# Patient Record
Sex: Male | Born: 1935 | Race: Black or African American | Hispanic: No | Smoking: Former smoker
Health system: Southern US, Community
[De-identification: ages and names within clinical notes are randomized; demographics above are authoritative.]

## PROBLEM LIST (undated history)

## (undated) DIAGNOSIS — M1990 Unspecified osteoarthritis, unspecified site: Secondary | ICD-10-CM

## (undated) DIAGNOSIS — R251 Tremor, unspecified: Secondary | ICD-10-CM

## (undated) DIAGNOSIS — G629 Polyneuropathy, unspecified: Secondary | ICD-10-CM

## (undated) DIAGNOSIS — Z950 Presence of cardiac pacemaker: Secondary | ICD-10-CM

## (undated) DIAGNOSIS — I2510 Atherosclerotic heart disease of native coronary artery without angina pectoris: Secondary | ICD-10-CM

## (undated) DIAGNOSIS — I10 Essential (primary) hypertension: Secondary | ICD-10-CM

## (undated) DIAGNOSIS — R200 Anesthesia of skin: Secondary | ICD-10-CM

## (undated) DIAGNOSIS — I480 Paroxysmal atrial fibrillation: Secondary | ICD-10-CM

## (undated) HISTORY — PX: OTHER SURGICAL HISTORY: SHX169

## (undated) HISTORY — DX: Anesthesia of skin: R20.0

## (undated) HISTORY — DX: Tremor, unspecified: R25.1

## (undated) HISTORY — DX: Polyneuropathy, unspecified: G62.9

## (undated) HISTORY — PX: CARDIAC CATHETERIZATION: SHX172

---

## 2000-01-14 ENCOUNTER — Ambulatory Visit (HOSPITAL_BASED_OUTPATIENT_CLINIC_OR_DEPARTMENT_OTHER): Admission: RE | Admit: 2000-01-14 | Discharge: 2000-01-14 | Payer: Self-pay | Admitting: Ophthalmology

## 2000-11-30 ENCOUNTER — Ambulatory Visit (HOSPITAL_COMMUNITY): Admission: RE | Admit: 2000-11-30 | Discharge: 2000-11-30 | Payer: Self-pay | Admitting: *Deleted

## 2000-12-11 ENCOUNTER — Encounter: Payer: Self-pay | Admitting: Cardiology

## 2000-12-11 ENCOUNTER — Ambulatory Visit (HOSPITAL_COMMUNITY): Admission: RE | Admit: 2000-12-11 | Discharge: 2000-12-11 | Payer: Self-pay | Admitting: Cardiology

## 2001-11-23 ENCOUNTER — Encounter: Payer: Self-pay | Admitting: Ophthalmology

## 2001-11-23 ENCOUNTER — Ambulatory Visit (HOSPITAL_COMMUNITY): Admission: RE | Admit: 2001-11-23 | Discharge: 2001-11-23 | Payer: Self-pay | Admitting: Ophthalmology

## 2008-05-30 ENCOUNTER — Emergency Department (HOSPITAL_COMMUNITY): Admission: EM | Admit: 2008-05-30 | Discharge: 2008-05-30 | Payer: Self-pay | Admitting: Family Medicine

## 2010-01-05 ENCOUNTER — Encounter: Admission: RE | Admit: 2010-01-05 | Discharge: 2010-01-05 | Payer: Self-pay | Admitting: Cardiology

## 2010-01-08 ENCOUNTER — Ambulatory Visit (HOSPITAL_COMMUNITY): Admission: RE | Admit: 2010-01-08 | Discharge: 2010-01-08 | Payer: Self-pay | Admitting: Cardiology

## 2010-02-26 ENCOUNTER — Ambulatory Visit (HOSPITAL_COMMUNITY): Admission: RE | Admit: 2010-02-26 | Discharge: 2010-02-27 | Payer: Self-pay | Admitting: Cardiology

## 2010-04-09 ENCOUNTER — Encounter: Admission: RE | Admit: 2010-04-09 | Discharge: 2010-04-09 | Payer: Self-pay | Admitting: Cardiology

## 2010-04-19 ENCOUNTER — Emergency Department (HOSPITAL_COMMUNITY): Admission: EM | Admit: 2010-04-19 | Discharge: 2010-04-19 | Payer: Self-pay | Admitting: Family Medicine

## 2010-05-20 ENCOUNTER — Encounter (INDEPENDENT_AMBULATORY_CARE_PROVIDER_SITE_OTHER): Payer: Self-pay | Admitting: Emergency Medicine

## 2010-05-20 ENCOUNTER — Ambulatory Visit
Admission: RE | Admit: 2010-05-20 | Discharge: 2010-05-20 | Payer: Self-pay | Source: Home / Self Care | Admitting: Emergency Medicine

## 2010-05-20 ENCOUNTER — Emergency Department (HOSPITAL_COMMUNITY): Admission: EM | Admit: 2010-05-20 | Discharge: 2010-05-20 | Payer: Self-pay | Admitting: Emergency Medicine

## 2010-05-20 ENCOUNTER — Ambulatory Visit: Payer: Self-pay | Admitting: Vascular Surgery

## 2010-05-24 ENCOUNTER — Emergency Department (HOSPITAL_COMMUNITY): Admission: EM | Admit: 2010-05-24 | Discharge: 2010-05-24 | Payer: Self-pay | Admitting: Family Medicine

## 2011-03-18 NOTE — Cardiovascular Report (Signed)
. [facility]  Patient:    David Booth, David Booth                           MRN: [id number] Proc. Date: 12/11/00 Adm. Date:  [id number] Disc. Date: [id number] Attending:  Sabino Gasser CC:         Jonita Albee, M.D., Urgent Care, [place], [place]  Cardiac Catheterization Lab   Cardiac Catheterization  PROCEDURES PERFORMED: 1. Selective coronary angiography by Judkins technique. 2. Retrograde left heart catheterization. 3. Left ventricular angiography. 4. Abdominal aortography.  COMPLICATIONS:  None.  ENTRY SITE:  Right femoral artery.  DYE USED:  Omnipaque.  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 He has a family history of coronary disease.  He does not have chest pain. Todays outpatient cardiac catheterization was performed electively.  RESULTS:  Pressures:  The left ventricular pressure is 140/18.  Central aortic pressure 140/70, mean of 100.  No aortic valve gradient by pullback technique.  Angiographic results: 1. The left main coronary artery is fairly short.  No lesions are seen. 2. The left anterior descending coronary artery courses to the cardiac apex    and gives rise to a pair of diagonal branches that are normal, as is the    LAD. 3. The circumflex and 1 obtuse marginal branch are normal. 4. The right coronary artery is basically normal.  Left ventriculogram:  The left ventricle shows normal contractility. Calculated ejection fraction is 59%.  No mitral regurgitation or LV thrombus is seen.  Abdominal aortography shows a smooth normal-sized aorta and common iliacs. The renal arteries are bilaterally normal.  FINAL DIAGNOSES: 1. Angiographically patent coronary arteries. 2. False positive EKG test for ischemia (clinical diagnosis). 3. Normal left ventricular systolic function, 59%. 4. Normal renal arteries and abdominal  aorta. DD:  12/11/00 TD:  12/11/00 Job: [id number]

## 2011-03-18 NOTE — Procedures (Signed)
[facility]  Patient:    David Booth, David Booth                           MRN: [id number] Proc. Date: 11/30/00 Adm. Date:  [id number] Attending:  Sabino Gasser                           Procedure Report  PROCEDURE:  Colonoscopy.  INDICATIONS:  Hemoccult positivity.  ANESTHESIA:  Demerol 60 mg, Versed 7 mg.  DESCRIPTION OF PROCEDURE:  With the patient mildly sedated in the left lateral decubitus position, the Olympus videoscopic colonoscope was inserted into the rectum after normal rectal exam and passed under direct vision to the cecum. The cecum identified by the ileocecal valve and appendiceal orifice, both of which were photographed.  From this point, the colonoscope was slowly withdrawn taking circumferential views of the entire colonic mucosa, stopping in numerous places to suction and dilute stool that was seen.  This had particulate matter in it that clogged the colonoscope on a number of occasions, and we had to brush the scope and dilute out this material because of continued blockage of the suction orifice.  Subsequently we suctioned as much as we could, taking circumferential views of the colonic mucosa as best we could until we reached the rectum which appeared normal on direct and retroflexed view.  The endoscope was straightened and withdrawn.  The patients vital signs and pulse oximeter remained stable.  The patient tolerated the procedure well without apparent complications.  FINDINGS:  Grossly negative examination.  Limited somewhat by prep.  PLAN:  May want to repeat this examination in two years for follow-up with maybe a better prep. DD:  11/30/00 TD:  11/30/00 Job: [id number]

## 2011-09-30 ENCOUNTER — Emergency Department (HOSPITAL_COMMUNITY)
Admission: EM | Admit: 2011-09-30 | Discharge: 2011-09-30 | Disposition: A | Discharge status: Home / Self Care | Payer: Medicare Other | Source: Home / Self Care

## 2011-09-30 DIAGNOSIS — Z91030 Bee allergy status: Secondary | ICD-10-CM

## 2011-09-30 DIAGNOSIS — T63441A Toxic effect of venom of bees, accidental (unintentional), initial encounter: Secondary | ICD-10-CM

## 2011-09-30 HISTORY — DX: Atherosclerotic heart disease of native coronary artery without angina pectoris: I25.10

## 2011-09-30 HISTORY — DX: Essential (primary) hypertension: I10

## 2011-09-30 HISTORY — DX: Presence of cardiac pacemaker: Z95.0

## 2011-09-30 MED ORDER — PREDNISONE 20 MG PO TABS
ORAL_TABLET | ORAL | Status: AC
  Filled 2011-09-30: qty 3

## 2011-09-30 MED ORDER — PREDNISONE 10 MG PO TABS: ORAL_TABLET | ORAL | Status: AC

## 2011-09-30 MED ORDER — PREDNISONE 20 MG PO TABS
60.0000 mg | ORAL_TABLET | Freq: Once | ORAL | Status: AC
  Administered 2011-09-30: 60 mg via ORAL

## 2011-09-30 NOTE — ED Provider Notes (Signed)
History     CSN: [id number] Arrival date & time: 09/30/2011  1:57 PM   None     Chief Complaint  Patient presents with  . Insect Bite    (Consider location/radiation/quality/duration/timing/severity/associated sxs/prior treatment) HPI Comments: Pt states he was cleaning up leaves in the yard and felt something poke the finger of his Rt hand. He initially thought it was a stick in the leaf pile but later noticed redness and swelling. He has a hx of bee sting allergy. His allergy is localized severe swelling. Has an epi-pen but did not use it. No dyspnea, tongue or throat swelling.  The history is provided by the patient.    Past Medical History  Diagnosis Date  . Coronary artery disease   . Hypertension   . Pacemaker     History reviewed. No pertinent past surgical history.  History reviewed. No pertinent family history.  History  Substance Use Topics  . Smoking status: Never Smoker   . Smokeless tobacco: Not on file  . Alcohol Use: No      Review of Systems  Constitutional: Negative for fever and chills.  HENT: Negative for congestion, rhinorrhea, sneezing and trouble swallowing.   Respiratory: Negative for cough, choking, chest tightness, shortness of breath and wheezing.   Cardiovascular: Negative for chest pain and palpitations.    Allergies  Bee venom  Home Medications   Current Outpatient Rx  Name Route Sig Dispense Refill  . ASPIRIN 81 MG PO TABS Oral Take 81 mg by mouth daily.      Marland Kitchen DILTIAZEM HCL 120 MG PO TABS Oral Take 120 mg by mouth 4 (four) times daily.      Marland Kitchen METOPROLOL TARTRATE 25 MG PO TABS Oral Take 25 mg by mouth 2 (two) times daily.      Marland Kitchen PREDNISONE 10 MG PO TABS  Take 5 tabs day 1, then 4 tabs day 2, then 3 tabs day 3, then 2 tabs day 4, then 1 tab day 5 15 tablet 0    BP 149/87  Pulse 67  Temp(Src) 97.9 F (36.6 C) (Oral)  Resp 18  SpO2 97%  Physical Exam  Nursing note and vitals reviewed. Constitutional: He appears  well-developed and well-nourished. No distress.  HENT:  Head: Normocephalic and atraumatic.  Right Ear: Tympanic membrane, external ear and ear canal normal.  Left Ear: Tympanic membrane, external ear and ear canal normal.  Nose: Nose normal.  Mouth/Throat: Uvula is midline, oropharynx is clear and moist and mucous membranes are normal. No oropharyngeal exudate, posterior oropharyngeal edema or posterior oropharyngeal erythema.  Neck: Neck supple.  Cardiovascular: Normal rate, regular rhythm and normal heart sounds.   Pulmonary/Chest: Effort normal and breath sounds normal. No respiratory distress.  Musculoskeletal:       Right hand: He exhibits normal range of motion, no tenderness, no bony tenderness and normal capillary refill. Normal strength noted.       Hands: Lymphadenopathy:    He has no cervical adenopathy.  Neurological: He is alert.  Skin: Skin is warm and dry.  Psychiatric: He has a normal mood and affect.    ED Course  Procedures (including critical care time)  Labs Reviewed - No data to display No results found.   1. Bee sting   2. Hx of bee sting allergy       MDM          Melody Comas, PA 09/30/11 [phone]

## 2011-09-30 NOTE — ED Notes (Signed)
States he was in yard working to pick up leaves aprox 1 H prior to arrival, when he felt sharp stinging sensation in his hand ; initially thought it was a stick, since he was not wearing gloves, but later began to sting and swell; C/o allergic to bee stings, and was not carrying his epi-pen at the time, but did not think it was a problem at the time ; NAD at present

## 2011-10-06 NOTE — ED Provider Notes (Signed)
Medical screening examination/treatment/procedure(s) were performed by non-physician practitioner and as supervising physician I was immediately available for consultation/collaboration.  LYKINS,KIMBERLY G  D.O.    Kimberly G Lykins, MD 10/06/11 0817 

## 2011-11-10 DIAGNOSIS — I10 Essential (primary) hypertension: Secondary | ICD-10-CM | POA: Diagnosis not present

## 2011-11-10 DIAGNOSIS — E781 Pure hyperglyceridemia: Secondary | ICD-10-CM | POA: Diagnosis not present

## 2011-11-14 DIAGNOSIS — I495 Sick sinus syndrome: Secondary | ICD-10-CM | POA: Diagnosis not present

## 2011-11-14 DIAGNOSIS — I4891 Unspecified atrial fibrillation: Secondary | ICD-10-CM | POA: Diagnosis not present

## 2011-11-14 DIAGNOSIS — I498 Other specified cardiac arrhythmias: Secondary | ICD-10-CM | POA: Diagnosis not present

## 2012-02-12 DIAGNOSIS — I498 Other specified cardiac arrhythmias: Secondary | ICD-10-CM | POA: Diagnosis not present

## 2012-02-12 DIAGNOSIS — I4891 Unspecified atrial fibrillation: Secondary | ICD-10-CM | POA: Diagnosis not present

## 2012-02-12 DIAGNOSIS — I495 Sick sinus syndrome: Secondary | ICD-10-CM | POA: Diagnosis not present

## 2012-03-22 DIAGNOSIS — I495 Sick sinus syndrome: Secondary | ICD-10-CM | POA: Diagnosis not present

## 2012-03-22 DIAGNOSIS — Z45018 Encounter for adjustment and management of other part of cardiac pacemaker: Secondary | ICD-10-CM | POA: Diagnosis not present

## 2012-03-22 DIAGNOSIS — I472 Ventricular tachycardia: Secondary | ICD-10-CM | POA: Diagnosis not present

## 2012-05-14 DIAGNOSIS — M1250 Traumatic arthropathy, unspecified site: Secondary | ICD-10-CM | POA: Diagnosis not present

## 2012-05-14 DIAGNOSIS — J309 Allergic rhinitis, unspecified: Secondary | ICD-10-CM | POA: Diagnosis not present

## 2012-05-14 DIAGNOSIS — I10 Essential (primary) hypertension: Secondary | ICD-10-CM | POA: Diagnosis not present

## 2012-05-14 DIAGNOSIS — I499 Cardiac arrhythmia, unspecified: Secondary | ICD-10-CM | POA: Diagnosis not present

## 2012-06-13 DIAGNOSIS — I472 Ventricular tachycardia: Secondary | ICD-10-CM | POA: Diagnosis not present

## 2012-06-13 DIAGNOSIS — I495 Sick sinus syndrome: Secondary | ICD-10-CM | POA: Diagnosis not present

## 2012-06-13 DIAGNOSIS — I4891 Unspecified atrial fibrillation: Secondary | ICD-10-CM | POA: Diagnosis not present

## 2012-06-13 DIAGNOSIS — H02059 Trichiasis without entropian unspecified eye, unspecified eyelid: Secondary | ICD-10-CM | POA: Diagnosis not present

## 2012-07-19 DIAGNOSIS — Z23 Encounter for immunization: Secondary | ICD-10-CM | POA: Diagnosis not present

## 2012-09-08 DIAGNOSIS — I495 Sick sinus syndrome: Secondary | ICD-10-CM | POA: Diagnosis not present

## 2012-10-15 DIAGNOSIS — H02059 Trichiasis without entropian unspecified eye, unspecified eyelid: Secondary | ICD-10-CM | POA: Diagnosis not present

## 2013-02-04 DIAGNOSIS — I4891 Unspecified atrial fibrillation: Secondary | ICD-10-CM | POA: Diagnosis not present

## 2013-02-04 DIAGNOSIS — I495 Sick sinus syndrome: Secondary | ICD-10-CM | POA: Diagnosis not present

## 2013-02-04 LAB — PACEMAKER DEVICE OBSERVATION

## 2013-02-08 DIAGNOSIS — Z125 Encounter for screening for malignant neoplasm of prostate: Secondary | ICD-10-CM | POA: Diagnosis not present

## 2013-02-08 DIAGNOSIS — I10 Essential (primary) hypertension: Secondary | ICD-10-CM | POA: Diagnosis not present

## 2013-03-26 ENCOUNTER — Encounter: Payer: Self-pay | Admitting: *Deleted

## 2013-04-03 ENCOUNTER — Other Ambulatory Visit: Payer: Self-pay | Admitting: Cardiovascular Disease

## 2013-04-03 ENCOUNTER — Encounter: Payer: Self-pay | Admitting: Cardiovascular Disease

## 2013-04-03 ENCOUNTER — Ambulatory Visit (INDEPENDENT_AMBULATORY_CARE_PROVIDER_SITE_OTHER): Payer: Medicare Other | Admitting: Cardiovascular Disease

## 2013-04-03 VITALS — BP 140/80 | HR 65 | Resp 16 | Ht 69.0 in | Wt 173.4 lb

## 2013-04-03 DIAGNOSIS — T82110D Breakdown (mechanical) of cardiac electrode, subsequent encounter: Secondary | ICD-10-CM

## 2013-04-03 DIAGNOSIS — N529 Male erectile dysfunction, unspecified: Secondary | ICD-10-CM | POA: Diagnosis not present

## 2013-04-03 DIAGNOSIS — I495 Sick sinus syndrome: Secondary | ICD-10-CM

## 2013-04-03 DIAGNOSIS — Z950 Presence of cardiac pacemaker: Secondary | ICD-10-CM | POA: Diagnosis not present

## 2013-04-03 DIAGNOSIS — I4510 Unspecified right bundle-branch block: Secondary | ICD-10-CM | POA: Diagnosis not present

## 2013-04-03 DIAGNOSIS — Z5189 Encounter for other specified aftercare: Secondary | ICD-10-CM | POA: Diagnosis not present

## 2013-04-03 NOTE — Progress Notes (Signed)
In office device interrogation. Normal device function. No changes made this session.

## 2013-04-03 NOTE — Patient Instructions (Addendum)
Your physician recommends that you schedule a follow-up appointment in: 1 year. Next remote pacemaker check is due on 07/08/2013.

## 2013-04-04 LAB — PACEMAKER DEVICE OBSERVATION
AL IMPEDENCE PM: 496 Ohm
ATRIAL PACING PM: 98.7
BAMS-0001: 171 {beats}/min
RV LEAD AMPLITUDE: 3 mv
RV LEAD IMPEDENCE PM: 340 Ohm

## 2013-04-07 ENCOUNTER — Encounter: Payer: Self-pay | Admitting: Cardiovascular Disease

## 2013-04-07 DIAGNOSIS — T82110A Breakdown (mechanical) of cardiac electrode, initial encounter: Secondary | ICD-10-CM | POA: Insufficient documentation

## 2013-04-07 DIAGNOSIS — I495 Sick sinus syndrome: Secondary | ICD-10-CM | POA: Insufficient documentation

## 2013-04-07 DIAGNOSIS — Z950 Presence of cardiac pacemaker: Secondary | ICD-10-CM | POA: Insufficient documentation

## 2013-04-07 DIAGNOSIS — N529 Male erectile dysfunction, unspecified: Secondary | ICD-10-CM | POA: Insufficient documentation

## 2013-04-07 NOTE — Assessment & Plan Note (Signed)
Since his last pacemaker check on the one episode of rapid ventricular rate has been recorded, a 14 second episode of paroxysmal atrial tachycardia

## 2013-04-07 NOTE — Progress Notes (Signed)
Patient ID: David Booth, male   DOB: 1933/10/19, 79 y.o.   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 He has not been troubled by palpitations syncope dizziness light headedness focal cortical deficits or any discomfort at the device site. The device was implanted for sinus node dysfunction with symptomatic bradycardia and need for medications to treat atrial tachycardia. He does not have a history of symptomatic atrial ventricular block but does have right bundle branch block by electrocardiography. He has a long AV conduction time.   Unfortunately his ventricular lead, which had fairly good parameters at implantation, has shown a pattern of gradual the duration and both sensing and pacing thresholds. To date this has not caused any problems of device function    Allergies  Allergen Reactions  . Bee Venom     Current Outpatient Prescriptions  Medication Sig Dispense Refill  . aspirin 81 MG tablet Take 81 mg by mouth daily.        Marland Kitchen b complex vitamins tablet Take 1 tablet by mouth daily.      . Cholecalciferol (VITAMIN D3) 2000 UNITS TABS Take by mouth 2 (two) times daily.      . Coenzyme Q10 (CO Q 10) 100 MG CAPS Take by mouth.      . diltiazem (CARDIZEM) 120 MG tablet Take 120 mg by mouth 4 (four) times daily.        Marland Kitchen EPINEPHrine (EPI-PEN) 0.3 mg/0.3 mL DEVI Inject into the muscle as needed.      . Garlic 1000 MG CAPS Take by mouth.      . Glucosamine-Chondroitin 750-600 MG TABS Take by mouth 3 (three) times daily.      . Lycopene 10 MG CAPS Take by mouth daily.      . metoprolol tartrate (LOPRESSOR) 25 MG tablet Take 25 mg by mouth 2 (two) times daily.        . Multiple Vitamin (MULTIVITAMIN) tablet Take 1 tablet by mouth daily.      . Omega-3 Fatty Acids (FISH OIL) 1200 MG  CAPS Take by mouth daily.      . vitamin C (ASCORBIC ACID) 250 MG tablet Take 250 mg by mouth daily.      . vitamin E (VITAMIN E) 400 UNIT capsule Take 400 Units by mouth daily.       No current facility-administered medications for this visit.    Past Medical History  Diagnosis Date  . Coronary artery disease   . Hypertension   . Pacemaker     No past surgical history on file.  No family history on file.  History   Social History  . Marital Status: Divorced    Spouse Name: N/A    Number of Children: N/A  . Years of Education: N/A   Occupational History  . Not on file.   Social History Main Topics  . Smoking status: Never Smoker   . Smokeless tobacco: Not on file  . Alcohol Use: No  . Drug Use: No  . Sexually Active:    Other Topics Concern  . Not on file   Social History Narrative  . No narrative on file    Review of systems: The patient specifically denies any chest pain at rest or with exertion, dyspnea at rest or with exertion, orthopnea,  paroxysmal nocturnal dyspnea, syncope, palpitations, focal neurological deficits, intermittent claudication, lower extremity edema, unexplained weight gain, cough, hemoptysis or wheezing. He has erectile dysfunction which is satisfactory treated with Viagra  The patient also denies abdominal pain, nausea, vomiting, dysphagia, diarrhea, constipation, polyuria, polydipsia, dysuria, hematuria, frequency, urgency, abnormal bleeding or bruising, fever, chills, unexpected weight changes, mood swings, change in skin or hair texture, change in voice quality, auditory or visual problems, allergic reactions or rashes, new musculoskeletal complaints other than usual "aches and pains".   PHYSICAL EXAM BP 140/80  Pulse 65  Resp 16  Ht 5\' 9"  (1.753 m)  Wt 78.654 kg (173 lb 6.4 oz)  BMI 25.6 kg/m2  General: Alert, oriented x3, no distress Head: no evidence of trauma, PERRL, EOMI, no exophtalmos or lid lag, no myxedema, no xanthelasma;  normal ears, nose and oropharynx Neck: normal jugular venous pulsations and no hepatojugular reflux; brisk carotid pulses without delay and no carotid bruits Chest: clear to auscultation, no signs of consolidation by percussion or palpation, normal fremitus, symmetrical and full respiratory excursions; LV subclavian pacemaker site Cardiovascular: normal position and quality of the apical impulse, regular rhythm, normal first and second heart sounds, no murmurs, rubs or gallops Abdomen: no tenderness or distention, no masses by palpation, no abnormal pulsatility or arterial bruits, normal bowel sounds, no hepatosplenomegaly Extremities: no clubbing, cyanosis or edema; 2+ radial, ulnar and brachial pulses bilaterally; 2+ right femoral, posterior tibial and dorsalis pedis pulses; 2+ left femoral, posterior tibial and dorsalis pedis pulses; no subclavian or femoral bruits Neurological: grossly nonfocal   EKG: Atrial paced ventricular sensed rhythm, long AV delay in excess of 440 ms, pre-existing right bundle branch block  Lipid Panel  No results found for this basename: chol, trig, hdl, cholhdl, vldl, ldlcalc    BMET No results found for this basename: na, k, cl, co2, glucose, bun, creatinine, calcium, gfrnonaa, gfraa     ASSESSMENT AND PLAN Tachy-brady syndrome Since his last pacemaker check on the one episode of rapid ventricular rate has been recorded, a 14 second episode of paroxysmal atrial tachycardia  Pacemaker Medtronic Revo, dual chamber, MRI-conditional, implanted April 2011. There is almost 99% atrial pacing but only 0.2% ventricular pacing. Atrial lead parameters are all excellent. The battery voltage is 2.99) ER I have to 0.1 V). The sensed ventricular waves are only 3.0 mV and the ventricular pacing threshold was elevated at 1.5 V at 0.8 ms. The ventricular lead parameters were much better at the time of device implantation and then showed gradual worsening over time.   No  orders of the defined types were placed in this encounter.   Meds ordered this encounter  Medications  . Glucosamine-Chondroitin 750-600 MG TABS    Sig: Take by mouth 3 (three) times daily.  . Omega-3 Fatty Acids (FISH OIL) 1200 MG CAPS    Sig: Take by mouth daily.  . Cholecalciferol (VITAMIN D3) 2000 UNITS TABS    Sig: Take by mouth 2 (two) times daily.  . Lycopene 10 MG CAPS    Sig: Take by mouth daily.  Marland Kitchen b complex vitamins tablet    Sig: Take 1 tablet by mouth daily.  . Multiple Vitamin (MULTIVITAMIN) tablet    Sig: Take 1 tablet by mouth daily.  . vitamin C (ASCORBIC ACID) 250 MG tablet    Sig: Take 250 mg by mouth daily.  . Coenzyme Q10 (CO Q 10) 100 MG CAPS    Sig: Take by mouth.  . vitamin E (VITAMIN E)  400 UNIT capsule    Sig: Take 400 Units by mouth daily.  . Garlic 1000 MG CAPS    Sig: Take by mouth.  . EPINEPHrine (EPI-PEN) 0.3 mg/0.3 mL DEVI    Sig: Inject into the muscle as needed.    Junious Silk, MD, [facility] [phone] office [phone] pager

## 2013-04-07 NOTE — Assessment & Plan Note (Signed)
Medtronic Revo, dual chamber, MRI-conditional, implanted April 2011. There is almost 99% atrial pacing but only 0.2% ventricular pacing. Atrial lead parameters are all excellent. The battery voltage is 2.99) ER I have to 0.1 V). The sensed ventricular waves are only 3.0 mV and the ventricular pacing threshold was elevated at 1.5 V at 0.8 ms. The ventricular lead parameters were much better at the time of device implantation and then showed gradual worsening over time.

## 2013-04-08 DIAGNOSIS — I4510 Unspecified right bundle-branch block: Secondary | ICD-10-CM | POA: Insufficient documentation

## 2013-04-09 ENCOUNTER — Encounter: Payer: Self-pay | Admitting: Cardiovascular Disease

## 2013-04-10 ENCOUNTER — Other Ambulatory Visit: Payer: Self-pay | Admitting: Cardiovascular Disease

## 2013-04-16 DIAGNOSIS — H02059 Trichiasis without entropian unspecified eye, unspecified eyelid: Secondary | ICD-10-CM | POA: Diagnosis not present

## 2013-04-16 DIAGNOSIS — H2510 Age-related nuclear cataract, unspecified eye: Secondary | ICD-10-CM | POA: Diagnosis not present

## 2013-06-25 DIAGNOSIS — I10 Essential (primary) hypertension: Secondary | ICD-10-CM | POA: Diagnosis not present

## 2013-07-11 ENCOUNTER — Other Ambulatory Visit: Payer: Self-pay | Admitting: Cardiovascular Disease

## 2013-07-11 DIAGNOSIS — I4891 Unspecified atrial fibrillation: Secondary | ICD-10-CM | POA: Diagnosis not present

## 2013-07-11 DIAGNOSIS — I495 Sick sinus syndrome: Secondary | ICD-10-CM

## 2013-07-11 LAB — PACEMAKER DEVICE OBSERVATION

## 2013-07-18 ENCOUNTER — Encounter: Payer: Self-pay | Admitting: *Deleted

## 2013-07-18 LAB — REMOTE PACEMAKER DEVICE
AL AMPLITUDE: 2.4 mv
AL IMPEDENCE PM: 496 Ohm
ATRIAL PACING PM: 98.6
BAMS-0001: 171 {beats}/min
BATTERY VOLTAGE: 2.99 V
RV LEAD IMPEDENCE PM: 328 Ohm
VENTRICULAR PACING PM: 0.1

## 2013-08-01 DIAGNOSIS — Z23 Encounter for immunization: Secondary | ICD-10-CM | POA: Diagnosis not present

## 2013-08-27 ENCOUNTER — Telehealth: Payer: Self-pay | Admitting: Cardiovascular Disease

## 2013-08-27 ENCOUNTER — Other Ambulatory Visit: Payer: Self-pay | Admitting: *Deleted

## 2013-08-27 MED ORDER — DILTIAZEM HCL ER COATED BEADS 120 MG PO CP24: 120.0000 mg | ORAL_CAPSULE | Freq: Every day | ORAL | Status: DC

## 2013-08-27 NOTE — Telephone Encounter (Signed)
Returned call.  Left message asking that hardy copy request be faxed to [phone] for refills.  

## 2013-08-27 NOTE — Telephone Encounter (Signed)
Rx was sent to pharmacy electronically. 

## 2013-08-27 NOTE — Telephone Encounter (Signed)
Need refill on his Diltiazem 120 mg #90

## 2013-10-10 ENCOUNTER — Other Ambulatory Visit: Payer: Self-pay | Admitting: Cardiovascular Disease

## 2013-10-10 NOTE — Telephone Encounter (Signed)
Rx was sent to pharmacy electronically. 

## 2013-10-14 ENCOUNTER — Ambulatory Visit: Payer: Medicare Other

## 2013-10-14 DIAGNOSIS — I495 Sick sinus syndrome: Secondary | ICD-10-CM | POA: Diagnosis not present

## 2013-10-16 DIAGNOSIS — H02059 Trichiasis without entropian unspecified eye, unspecified eyelid: Secondary | ICD-10-CM | POA: Diagnosis not present

## 2013-10-16 DIAGNOSIS — H2510 Age-related nuclear cataract, unspecified eye: Secondary | ICD-10-CM | POA: Diagnosis not present

## 2013-10-27 ENCOUNTER — Encounter: Payer: Self-pay | Admitting: *Deleted

## 2013-10-27 LAB — MDC_IDC_ENUM_SESS_TYPE_REMOTE
Brady Statistic AP VS Percent: 98.97 %
Brady Statistic AS VP Percent: 0 %
Brady Statistic RA Percent Paced: 99.1 %
Date Time Interrogation Session: 20141215231433
Lead Channel Impedance Value: 528 Ohm
Lead Channel Setting Pacing Amplitude: 2 V
Zone Setting Detection Interval: 350 ms

## 2013-11-22 ENCOUNTER — Other Ambulatory Visit: Payer: Self-pay | Admitting: Cardiovascular Disease

## 2013-11-22 NOTE — Telephone Encounter (Signed)
Rx was sent to pharmacy electronically. 

## 2013-11-25 DIAGNOSIS — I10 Essential (primary) hypertension: Secondary | ICD-10-CM | POA: Diagnosis not present

## 2013-11-25 DIAGNOSIS — Z125 Encounter for screening for malignant neoplasm of prostate: Secondary | ICD-10-CM | POA: Diagnosis not present

## 2014-01-14 ENCOUNTER — Ambulatory Visit (INDEPENDENT_AMBULATORY_CARE_PROVIDER_SITE_OTHER): Payer: Medicare Other | Admitting: *Deleted

## 2014-01-14 DIAGNOSIS — I495 Sick sinus syndrome: Secondary | ICD-10-CM

## 2014-01-14 LAB — MDC_IDC_ENUM_SESS_TYPE_REMOTE
Battery Voltage: 2.99 V
Brady Statistic AP VP Percent: 0.14 %
Brady Statistic AS VS Percent: 1.06 %
Brady Statistic RA Percent Paced: 98.94 %
Date Time Interrogation Session: 20150317162530
Lead Channel Impedance Value: 536 Ohm
Lead Channel Sensing Intrinsic Amplitude: 2.0782
Lead Channel Setting Pacing Amplitude: 2 V
Lead Channel Setting Pacing Amplitude: 3 V
Lead Channel Setting Pacing Pulse Width: 0.8 ms
MDC IDC MSMT LEADCHNL RV IMPEDANCE VALUE: 324 Ohm
MDC IDC MSMT LEADCHNL RV SENSING INTR AMPL: 3.703
MDC IDC SET LEADCHNL RV SENSING SENSITIVITY: 0.9 mV
MDC IDC STAT BRADY AP VS PERCENT: 98.8 %
MDC IDC STAT BRADY AS VP PERCENT: 0 %
MDC IDC STAT BRADY RV PERCENT PACED: 0.14 %
Zone Setting Detection Interval: 350 ms
Zone Setting Detection Interval: 400 ms

## 2014-01-14 LAB — PACEMAKER DEVICE OBSERVATION

## 2014-01-27 NOTE — Progress Notes (Signed)
PPM remote 

## 2014-01-31 ENCOUNTER — Encounter: Payer: Self-pay | Admitting: *Deleted

## 2014-03-31 DIAGNOSIS — E7800 Pure hypercholesterolemia, unspecified: Secondary | ICD-10-CM | POA: Diagnosis not present

## 2014-03-31 DIAGNOSIS — I10 Essential (primary) hypertension: Secondary | ICD-10-CM | POA: Diagnosis not present

## 2014-04-03 ENCOUNTER — Ambulatory Visit (INDEPENDENT_AMBULATORY_CARE_PROVIDER_SITE_OTHER): Payer: Medicare Other | Admitting: Cardiovascular Disease

## 2014-04-03 ENCOUNTER — Encounter: Payer: Self-pay | Admitting: Cardiovascular Disease

## 2014-04-03 VITALS — BP 117/67 | HR 67 | Resp 16 | Ht 69.5 in | Wt 176.0 lb

## 2014-04-03 DIAGNOSIS — Z950 Presence of cardiac pacemaker: Secondary | ICD-10-CM | POA: Diagnosis not present

## 2014-04-03 DIAGNOSIS — I495 Sick sinus syndrome: Secondary | ICD-10-CM

## 2014-04-03 LAB — PACEMAKER DEVICE OBSERVATION

## 2014-04-03 NOTE — Progress Notes (Signed)
Patient ID: David Booth, male   DOB: [birth year], 80 y.o.   MRN: [id number]     Reason for office visit Tachybradycardia syndrome, pacemaker followup  This is a year with a followup for David Booth and he has not had any new health problems in the last 12 months. Unfortunately his fiance has been diagnosed with advanced cancer and appears to be approaching her last days.  He has a dual chamber permanent pacemaker implant in April 2011 for sinus bradycardia, AV conduction disease and need for medications for paroxysmal atrial tachycardia. His device is an MRI conditional Medtronic revo dual chamber device. Despite a very long AV conduction time. He continues to have virtually 0 need for ventricular pacing (PR interval 394 ms) the device programmed in the PE. He has a long-standing right bundle branch block. There is virtually 100% atrial pacing. Underlying rhythm is sinus bradycardia at 36 beats per minute. Lead parameters are adequate, with a chronically low sensed R-wave of only 3 mV. He also has a relatively high ventricular pacing threshold at 1.0 V 0.8 ms. The deterioration in these parameters (which were excellent at implant) seems to have stabilized, and so far they have not interfered with normal device function.  He has no cardiovascular complaints.  Allergies  Allergen Reactions  . Bee Venom     Current Outpatient Prescriptions  Medication Sig Dispense Refill  . aspirin 81 MG tablet Take 81 mg by mouth daily.        Marland Kitchen b complex vitamins tablet Take 1 tablet by mouth daily.      . Cholecalciferol (VITAMIN D3) 2000 UNITS TABS Take 1 tablet by mouth daily.       . Coenzyme Q10 (CO Q 10) 100 MG CAPS Take 1 capsule by mouth daily.       Marland Kitchen diltiazem (CARDIZEM CD) 120 MG 24 hr capsule TAKE 1 CAPSULE (120 MG TOTAL) BY MOUTH DAILY.  90 capsule  1  . EPINEPHrine (EPI-PEN) 0.3 mg/0.3 mL DEVI Inject into the muscle as needed.      . Garlic 2585 MG CAPS Take by mouth.      .  Glucosamine-Chondroit-Vit C-Mn (GLUCOSAMINE CHONDR 1500 COMPLX) CAPS Take 1 capsule by mouth 2 (two) times daily.      . Lycopene 10 MG CAPS Take by mouth daily.      . metoprolol tartrate (LOPRESSOR) 25 MG tablet TAKE 1 TABLET TWICE DAILY  60 tablet  6  . Multiple Vitamin (MULTIVITAMIN) tablet Take 1 tablet by mouth daily.      . Omega-3 Fatty Acids (FISH OIL) 1200 MG CAPS Take by mouth daily.      . vitamin C (ASCORBIC ACID) 250 MG tablet Take 250 mg by mouth daily.      . vitamin E (VITAMIN E) 400 UNIT capsule Take 400 Units by mouth 3 (three) times daily.        No current facility-administered medications for this visit.    Past Medical History  Diagnosis Date  . Coronary artery disease   . Hypertension   . Pacemaker     No past surgical history on file.  No family history on file.  History   Social History  . Marital Status: Divorced    Spouse Name: N/A    Number of Children: N/A  . Years of Education: N/A   Occupational History  . Not on file.   Social History Main Topics  . Smoking status: Never Smoker   . Smokeless  tobacco: Not on file  . Alcohol Use: No  . Drug Use: No  . Sexual Activity:    Other Topics Concern  . Not on file   Social History Narrative  . No narrative on file    Review of systems: The patient specifically denies any chest pain at rest or with exertion, dyspnea at rest or with exertion, orthopnea, paroxysmal nocturnal dyspnea, syncope, palpitations, focal neurological deficits, intermittent claudication, lower extremity edema, unexplained weight gain, cough, hemoptysis or wheezing. He has erectile dysfunction which is satisfactory treated with Viagra  The patient also denies abdominal pain, nausea, vomiting, dysphagia, diarrhea, constipation, polyuria, polydipsia, dysuria, hematuria, frequency, urgency, abnormal bleeding or bruising, fever, chills, unexpected weight changes, mood swings, change in skin or hair texture, change in voice  quality, auditory or visual problems, allergic reactions or rashes, new musculoskeletal complaints other than usual "aches and pains".   PHYSICAL EXAM BP 117/67  Pulse 67  Resp 16  Ht 5' 9.5" (1.765 m)  Wt 176 lb (79.833 kg)  BMI 25.63 kg/m2 General: Alert, oriented x3, no distress  Head: no evidence of trauma, PERRL, EOMI, no exophtalmos or lid lag, no myxedema, no xanthelasma; normal ears, nose and oropharynx  Neck: normal jugular venous pulsations and no hepatojugular reflux; brisk carotid pulses without delay and no carotid bruits  Chest: clear to auscultation, no signs of consolidation by percussion or palpation, normal fremitus, symmetrical and full respiratory excursions; LV subclavian pacemaker site  Cardiovascular: normal position and quality of the apical impulse, regular rhythm, normal first and second heart sounds, no murmurs, rubs or gallops  Abdomen: no tenderness or distention, no masses by palpation, no abnormal pulsatility or arterial bruits, normal bowel sounds, no hepatosplenomegaly  Extremities: no clubbing, cyanosis or edema; 2+ radial, ulnar and brachial pulses bilaterally; 2+ right femoral, posterior tibial and dorsalis pedis pulses; 2+ left femoral, posterior tibial and dorsalis pedis pulses; no subclavian or femoral bruits  Neurological: grossly nonfocal   EKG: Atrial paced, ventricular sinus, right bundle branch block, AV delay 394 ms    ASSESSMENT AND PLAN Pacemaker - Medtronic Revo, dual chamber, MRI-conditional, implanted April 2011 100 % atrial paced rhythm with very long AV conduction time but no need for ventricular pacing. No programming changes were necessary today. Note chronic mediocre ventricular sensing and pacing thresholds, now stable.  Tachy-brady syndrome Episodes of atrial tachycardia are very rare and very brief. No change in medications.   Orders Placed This Encounter  Procedures  . Implantable device check   Meds ordered this encounter    Medications  . Glucosamine-Chondroit-Vit C-Mn (GLUCOSAMINE CHONDR 1500 COMPLX) CAPS    Sig: Take 1 capsule by mouth 2 (two) times daily.    David Harmening  Sanda Klein, MD, [facility] [phone] office [phone] pager

## 2014-04-03 NOTE — Patient Instructions (Addendum)
Remote monitoring is used to monitor your pacemaker from home. This monitoring reduces the number of office visits required to check your device to one time per year. It allows [place] to keep an eye on the functioning of your device to ensure it is working properly. You are scheduled for a device check from home on 07-08-2014. You may send your transmission at any time that day. If you have a wireless device, the transmission will be sent automatically. After your physician reviews your transmission, you will receive a postcard with your next transmission date.  Your physician recommends that you schedule a follow-up appointment in: 12 months with Dr.Croitoru + pacemaker check

## 2014-04-04 LAB — MDC_IDC_ENUM_SESS_TYPE_INCLINIC
Brady Statistic AP VS Percent: 98.7 %
Brady Statistic RV Percent Paced: 0.12 %
Date Time Interrogation Session: 20150604211411
Lead Channel Impedance Value: 336 Ohm
Lead Channel Impedance Value: 584 Ohm
Lead Channel Pacing Threshold Amplitude: 1 V
Lead Channel Pacing Threshold Amplitude: 1.5 V
Lead Channel Pacing Threshold Pulse Width: 0.8 ms
Lead Channel Setting Pacing Amplitude: 2 V
Lead Channel Setting Pacing Amplitude: 3 V
Lead Channel Setting Sensing Sensitivity: 0.9 mV
MDC IDC MSMT BATTERY VOLTAGE: 2.99 V
MDC IDC MSMT LEADCHNL RA PACING THRESHOLD PULSEWIDTH: 0.4 ms
MDC IDC MSMT LEADCHNL RA SENSING INTR AMPL: 2.9441
MDC IDC MSMT LEADCHNL RV SENSING INTR AMPL: 3.0298
MDC IDC SET LEADCHNL RV PACING PULSEWIDTH: 0.8 ms
MDC IDC SET ZONE DETECTION INTERVAL: 350 ms
MDC IDC SET ZONE DETECTION INTERVAL: 400 ms
MDC IDC STAT BRADY AP VP PERCENT: 0.12 %
MDC IDC STAT BRADY AS VP PERCENT: 0 %
MDC IDC STAT BRADY AS VS PERCENT: 1.18 %
MDC IDC STAT BRADY RA PERCENT PACED: 98.82 %

## 2014-04-04 NOTE — Assessment & Plan Note (Signed)
100 % atrial paced rhythm with very long AV conduction time but no need for ventricular pacing. No programming changes were necessary today. Note chronic mediocre ventricular sensing and pacing thresholds, now stable.

## 2014-04-04 NOTE — Assessment & Plan Note (Signed)
Episodes of atrial tachycardia are very rare and very brief. No change in medications.

## 2014-04-07 ENCOUNTER — Encounter: Payer: Self-pay | Admitting: Cardiovascular Disease

## 2014-04-16 DIAGNOSIS — H2589 Other age-related cataract: Secondary | ICD-10-CM | POA: Diagnosis not present

## 2014-04-16 DIAGNOSIS — H02059 Trichiasis without entropian unspecified eye, unspecified eyelid: Secondary | ICD-10-CM | POA: Diagnosis not present

## 2014-04-21 ENCOUNTER — Telehealth: Payer: Self-pay | Admitting: Cardiovascular Disease

## 2014-04-21 NOTE — Telephone Encounter (Signed)
Pt is in [place] forgot his box to transmit his pacemaker. He will be there for a month,what does he need to do?

## 2014-04-22 NOTE — Telephone Encounter (Signed)
Patient advised to send remotes for [employer] once he returns to [employer]. Patient voiced understanding.

## 2014-05-13 ENCOUNTER — Other Ambulatory Visit: Payer: Self-pay | Admitting: Cardiovascular Disease

## 2014-05-13 NOTE — Telephone Encounter (Signed)
Rx refill sent to patient pharmacy   

## 2014-05-14 ENCOUNTER — Other Ambulatory Visit: Payer: Self-pay | Admitting: Cardiovascular Disease

## 2014-05-14 NOTE — Telephone Encounter (Signed)
Rx was sent to pharmacy electronically. 

## 2014-05-28 ENCOUNTER — Encounter: Payer: Self-pay | Admitting: Cardiovascular Disease

## 2014-06-03 DIAGNOSIS — G589 Mononeuropathy, unspecified: Secondary | ICD-10-CM | POA: Diagnosis not present

## 2014-06-12 ENCOUNTER — Ambulatory Visit (INDEPENDENT_AMBULATORY_CARE_PROVIDER_SITE_OTHER): Payer: Medicare Other

## 2014-06-12 ENCOUNTER — Encounter: Payer: Self-pay | Admitting: Podiatry

## 2014-06-12 ENCOUNTER — Ambulatory Visit: Payer: Medicare Other | Admitting: Podiatry

## 2014-06-12 VITALS — BP 142/77 | HR 68 | Resp 12

## 2014-06-12 DIAGNOSIS — M7750 Other enthesopathy of unspecified foot: Secondary | ICD-10-CM | POA: Diagnosis not present

## 2014-06-12 DIAGNOSIS — R52 Pain, unspecified: Secondary | ICD-10-CM

## 2014-06-12 DIAGNOSIS — M7741 Metatarsalgia, right foot: Secondary | ICD-10-CM

## 2014-06-12 DIAGNOSIS — M7742 Metatarsalgia, left foot: Secondary | ICD-10-CM

## 2014-06-12 NOTE — Progress Notes (Signed)
   Subjective:    Patient ID: David Booth, male    DOB: January 30, 1933, 80 y.o.   MRN: [id number]  HPI   62 her old male presents with complaints of bilateral feet "numbness". He states approximately 2 weeks ago while on vacation he was doing a lot of walking and felt he started to feel as if he was "walking on rocks" while wearing shoes. He feels that the skin on the bottom of his feet are "thick". He denies any real numbness, tingling, sharp shooting pains to his feet. States he has difficulty wearing certain shoes and she has tried some OTC orthotics. States at that the "numbness" is worse with walking. Denies a history of any back problems, diabetes. Denies any specific trauma.  No claudication symptoms. No other complaints at this time.    Review of Systems  Neurological: Positive for numbness.  All other systems reviewed and are negative.      Objective:   Physical Exam  Nursing note and vitals reviewed. Constitutional: He is oriented to person, place, and time. He appears well-developed and well-nourished.  Musculoskeletal: Normal range of motion. He exhibits no edema and no tenderness.  No areas of tenderness on palpation. Hammertoe contractures of lesser digits with bunion deformity b/l. Decrease in medial arch upon weight bearing. Loss of plantar fat pad and the metatarsal head bilaterally.  Neurological: He is alert and oriented to person, place, and time. He has normal reflexes.  Protective sensation intact with Derrel Nip monofilament. Mild decrease in vibratory sensation. Negative Tinel sign b/l.  Skin: No rash noted. No erythema.  No open lesions  DP/PT pulses palpable 2/4 b/l. CRT < 3 sec.         Assessment & Plan:  80 year old male with possible neuropathy  However at this time seems to be more mechanical in nature. -Given a decreased medial arch right, bunion, hammertoes, loss of plantar fat pad would recommend an orthotic to help stabilize the foot. Also discussed  a more rigid OTC orthotic as she does not want to purchase a custom orthotic. Also dispensed a metatarsal pad to wear her bilaterally to help offload prominent bony areas and the metatarsal heads. -Discussed with the patient should be evaluated again by his primary care physician to rule out diabetes or any other causes of neuropathy. Will continue to monitor. -Followup in one month or sooner if any problems or to occur with the symptoms worsen.

## 2014-06-17 ENCOUNTER — Telehealth: Payer: Self-pay | Admitting: Podiatry

## 2014-06-17 NOTE — Telephone Encounter (Signed)
Called patient to follow up with him after his appointment. He states he continues to have "numbness" on the bottom of his foot. He states that the pain is resolved when wearing a cushion in his shoe or a soft shoe.  Recommended he follow up with his PCP to rule out cases of neuropathy and recommended he be evaluated by neurology if symptoms continue.  He stated he would call his PCP.  Follow up as scheduled, or sooner if any problems are to arise.

## 2014-06-20 ENCOUNTER — Other Ambulatory Visit: Payer: Self-pay | Admitting: Cardiovascular Disease

## 2014-06-20 NOTE — Telephone Encounter (Signed)
Rx was sent to pharmacy electronically. 

## 2014-07-08 ENCOUNTER — Ambulatory Visit (INDEPENDENT_AMBULATORY_CARE_PROVIDER_SITE_OTHER): Payer: Medicare Other | Admitting: *Deleted

## 2014-07-08 ENCOUNTER — Encounter: Payer: Self-pay | Admitting: Cardiovascular Disease

## 2014-07-08 DIAGNOSIS — I495 Sick sinus syndrome: Secondary | ICD-10-CM | POA: Diagnosis not present

## 2014-07-08 LAB — MDC_IDC_ENUM_SESS_TYPE_REMOTE
Battery Voltage: 2.99 V
Brady Statistic AP VS Percent: 97.69 %
Date Time Interrogation Session: 20150908155506
Lead Channel Sensing Intrinsic Amplitude: 2.5978
Lead Channel Setting Pacing Pulse Width: 0.8 ms
Lead Channel Setting Sensing Sensitivity: 0.9 mV
MDC IDC MSMT LEADCHNL RA IMPEDANCE VALUE: 528 Ohm
MDC IDC MSMT LEADCHNL RV IMPEDANCE VALUE: 340 Ohm
MDC IDC MSMT LEADCHNL RV SENSING INTR AMPL: 2.6931
MDC IDC SET LEADCHNL RA PACING AMPLITUDE: 2 V
MDC IDC SET LEADCHNL RV PACING AMPLITUDE: 3 V
MDC IDC SET ZONE DETECTION INTERVAL: 350 ms
MDC IDC STAT BRADY AP VP PERCENT: 0.92 %
MDC IDC STAT BRADY AS VP PERCENT: 0.24 %
MDC IDC STAT BRADY AS VS PERCENT: 1.15 %
MDC IDC STAT BRADY RA PERCENT PACED: 98.61 %
MDC IDC STAT BRADY RV PERCENT PACED: 1.16 %
Zone Setting Detection Interval: 400 ms

## 2014-07-08 NOTE — Progress Notes (Signed)
Remote pacemaker transmission.   

## 2014-07-09 ENCOUNTER — Ambulatory Visit: Payer: Medicare Other | Admitting: Podiatry

## 2014-07-09 ENCOUNTER — Encounter: Payer: Self-pay | Admitting: Podiatry

## 2014-07-09 ENCOUNTER — Ambulatory Visit (INDEPENDENT_AMBULATORY_CARE_PROVIDER_SITE_OTHER): Payer: Medicare Other | Admitting: Podiatry

## 2014-07-09 VITALS — BP 140/74 | HR 65 | Resp 15 | Ht 69.0 in | Wt 170.0 lb

## 2014-07-09 DIAGNOSIS — M7741 Metatarsalgia, right foot: Secondary | ICD-10-CM

## 2014-07-09 DIAGNOSIS — G589 Mononeuropathy, unspecified: Secondary | ICD-10-CM | POA: Diagnosis not present

## 2014-07-09 DIAGNOSIS — M7742 Metatarsalgia, left foot: Principal | ICD-10-CM

## 2014-07-09 DIAGNOSIS — G629 Polyneuropathy, unspecified: Secondary | ICD-10-CM

## 2014-07-09 DIAGNOSIS — M7750 Other enthesopathy of unspecified foot: Secondary | ICD-10-CM

## 2014-07-09 NOTE — Patient Instructions (Signed)
Follow up with primary care doctor for possible neuropathy and underlying causes. Neurology follow up.  Call with any questions/concerns.

## 2014-07-10 ENCOUNTER — Encounter: Payer: Self-pay | Admitting: Podiatry

## 2014-07-10 NOTE — Progress Notes (Signed)
Patient ID: KAVIR SAVOCA, male   DOB: [birth year], 80 y.o.   MRN: [id number]  Subjective:  80 year old male returns the office for followup evaluation of bilateral foot pain on the plantar metatarsal heads, as well as numbness bilateral feet. States that he has had no acute changes since last appointment and has not noticed any increase in symptoms. States he has some research on neuropathy. He did purchase orthotics which may have given him some relief. Did not try the metatarsal pads. No new complaints.  Objective: AAO x, 3, NAD DP/PT pulses palpable b/l. CRT < 3sec Protective sensation intact with Derrel Nip monofilament, Achilles tendon reflex intact. Vibratory sensation is decreased bilaterally. Hammertoe contractures of lesser digits with bunion deformity bilaterally. Decrease in medial arch height upon weightbearing. Fat pad atrophy of the metatarsal heads the prominent metatarsal heads. No pinpoint bony tenderness and no pain the vibratory sensation. MMT 5/5, ROM WNL No leg pain/warmth/edema   Assessment: 80 year old male with possible neuropathy, metatarsalgia  Plan: -Various treatment options discussed including alternatives, risks, complications. -Recommend followup at his primary care physicianto rule out causes of neuropathy. Consider neurology evaluation.  -Metatarsal pad added to his inserts to help take the pressure off the metatarsal heads.  -Continue supportive shoe gear and inserts.  -Followup up in 1 month, or after further evaluation of possible neuropathy. Patient states he has an appointment with his PCP next week. Call with any questions or concerns or change in symptoms.

## 2014-07-15 DIAGNOSIS — G589 Mononeuropathy, unspecified: Secondary | ICD-10-CM | POA: Diagnosis not present

## 2014-07-22 ENCOUNTER — Encounter: Payer: Self-pay | Admitting: Cardiology

## 2014-07-24 ENCOUNTER — Encounter: Payer: Self-pay | Admitting: Cardiology

## 2014-07-31 ENCOUNTER — Ambulatory Visit (INDEPENDENT_AMBULATORY_CARE_PROVIDER_SITE_OTHER): Payer: Self-pay

## 2014-07-31 ENCOUNTER — Ambulatory Visit (INDEPENDENT_AMBULATORY_CARE_PROVIDER_SITE_OTHER): Payer: Medicare Other | Admitting: Neurology

## 2014-07-31 DIAGNOSIS — G629 Polyneuropathy, unspecified: Secondary | ICD-10-CM

## 2014-07-31 DIAGNOSIS — Z0289 Encounter for other administrative examinations: Secondary | ICD-10-CM

## 2014-07-31 DIAGNOSIS — R202 Paresthesia of skin: Secondary | ICD-10-CM | POA: Insufficient documentation

## 2014-07-31 NOTE — Procedures (Signed)
   NCS (NERVE CONDUCTION STUDY) WITH EMG (ELECTROMYOGRAPHY) REPORT   STUDY DATE: October first 2015 PATIENT NAME: David Booth DOB: 01-05-1933 MRN: [id number]    TECHNOLOGIST: Laretta Alstrom ELECTROMYOGRAPHER: Marcial Pacas M.D.  CLINICAL INFORMATION:  80 year old gentleman, presenting with two-month history of bilateral toes, and plantar feet paresthesia, he denies significant low back pain  On examination, decreased vibratory sensation at toes, length dependent decreased to pinprick, there was no distal weakness, ankle reflexes were absent.  FINDINGS: NERVE CONDUCTION STUDY: Bilateral peroneal sensory responses were absent. Bilateral medial, lateral plantar sensory responses were absent. Bilateral tibial motor responses were normal. Left peroneal to EDB motor response was normal. Right peroneal to EDB motor response showed severely decreased CMAP amplitude, with normal distal latency, conduction velocity, and F wave latency.   NEEDLE ELECTROMYOGRAPHY: Selected needle examination was performed at right lower extremity muscles, right lumbosacral paraspinal muscles.  Needle examination of right tibialis anterior, tibialis posterior, peroneal longus, vastus lateralis, biceps femoris short head, abductor pollicis longus was normal.  There was no spontaneous activity at right lumbosacral paraspinal muscles, right L4-5 S1  IMPRESSION:   This is a mild abnormal study. There is electrodiagnostic evidence of mild length dependent axonal, sensory dominant peripheral neuropathy. There was no evidence of right lumbar radiculopathy.   INTERPRETING PHYSICIAN:   Marcial Pacas M.D. Ph.D. [facility] [address], [place], [place] [address] [phone]

## 2014-08-05 DIAGNOSIS — Z23 Encounter for immunization: Secondary | ICD-10-CM | POA: Diagnosis not present

## 2014-08-19 ENCOUNTER — Other Ambulatory Visit: Payer: Self-pay | Admitting: Cardiovascular Disease

## 2014-08-20 DIAGNOSIS — G608 Other hereditary and idiopathic neuropathies: Secondary | ICD-10-CM | POA: Diagnosis not present

## 2014-08-20 NOTE — Telephone Encounter (Signed)
Refilled #90 capsules with 3 refills on 05/14/2014

## 2014-08-22 ENCOUNTER — Other Ambulatory Visit: Payer: Self-pay | Admitting: Cardiovascular Disease

## 2014-08-22 NOTE — Telephone Encounter (Signed)
Mr.Pixler is calling because the [facility] states that they have faxed over the refilled request but has not heard anything from [place]. Mr. Suchy is stating that he is almost out of his medication Diltiazem 120mg  . Please call    Thanks

## 2014-09-19 DIAGNOSIS — G608 Other hereditary and idiopathic neuropathies: Secondary | ICD-10-CM | POA: Diagnosis not present

## 2014-09-30 ENCOUNTER — Encounter: Payer: Self-pay | Admitting: Neurology

## 2014-09-30 ENCOUNTER — Ambulatory Visit (INDEPENDENT_AMBULATORY_CARE_PROVIDER_SITE_OTHER): Payer: Medicare Other | Admitting: Neurology

## 2014-09-30 VITALS — BP 134/87 | HR 76 | Ht 69.0 in | Wt 175.0 lb

## 2014-09-30 DIAGNOSIS — R208 Other disturbances of skin sensation: Secondary | ICD-10-CM

## 2014-09-30 DIAGNOSIS — R209 Unspecified disturbances of skin sensation: Secondary | ICD-10-CM | POA: Diagnosis not present

## 2014-09-30 DIAGNOSIS — G609 Hereditary and idiopathic neuropathy, unspecified: Secondary | ICD-10-CM | POA: Diagnosis not present

## 2014-09-30 DIAGNOSIS — R200 Anesthesia of skin: Secondary | ICD-10-CM | POA: Insufficient documentation

## 2014-09-30 NOTE — Progress Notes (Signed)
PATIENT: David Booth DOB: [birth year]  HISTORICAL  TREYLIN Booth is a 80 yo RH AAM, was referred by Dr. Criss Rosales for EMG/NCS in October first 2015, which has demonstrated mild axonal peripheral neuropathy, he came in to follow-up for his peripheral neuropathy  He had a past medical history of pacemaker in 2011, presented with tachycardia then  Since July 2015, he noticed bilateral plantar feet paresthesia, mainly involving ball of his feet, he denies gait difficulty, no significant low back pain, no hands paresthesia,  He has been exercise regularly, but over the past few months, he has to deal with his significant other's, who has suffered metastatic lung cancer numbness,  He denies significant neck pain, no low back pain, he was giving a sample of Lyrica, but he does not think his symptoms is severe enough for him to committed to take daily medications, he denies significant pain, mainly numbness, at the bottom of his feet   REVIEW OF SYSTEMS: Full 14 system review of systems performed and notable only fas above  ALLERGIES: Allergies  Allergen Reactions  . Bee Venom     HOME MEDICATIONS: Current Outpatient Prescriptions on File Prior to Visit  Medication Sig Dispense Refill  . aspirin 81 MG tablet Take 81 mg by mouth daily.      Marland Kitchen b complex vitamins tablet Take 1 tablet by mouth daily.    . Cholecalciferol (VITAMIN D3) 2000 UNITS TABS Take 1 tablet by mouth daily.     . Coenzyme Q10 (CO Q 10) 100 MG CAPS Take 1 capsule by mouth daily.     Marland Kitchen diltiazem (CARDIZEM CD) 120 MG 24 hr capsule TAKE 1 CAPSULE (120 MG TOTAL) BY MOUTH DAILY. 30 capsule 6  . EPINEPHrine (EPI-PEN) 0.3 mg/0.3 mL DEVI Inject into the muscle as needed.    . Garlic 1478 MG CAPS Take by mouth.    . Glucosamine-Chondroit-Vit C-Mn (GLUCOSAMINE CHONDR 1500 COMPLX) CAPS Take 1 capsule by mouth 2 (two) times daily.    . Lycopene 10 MG CAPS Take by mouth daily.    . metoprolol tartrate (LOPRESSOR) 25 MG tablet Take 1  tablet (25 mg total) by mouth 2 (two) times daily. 60 tablet 10  . Multiple Vitamin (MULTIVITAMIN) tablet Take 1 tablet by mouth daily.    . Omega-3 Fatty Acids (FISH OIL) 1200 MG CAPS Take by mouth daily.    . vitamin C (ASCORBIC ACID) 250 MG tablet Take 250 mg by mouth daily.    . vitamin E (VITAMIN E) 400 UNIT capsule Take 400 Units by mouth 3 (three) times daily.      No current facility-administered medications on file prior to visit.    PAST MEDICAL HISTORY: Past Medical History  Diagnosis Date  . Coronary artery disease   . Hypertension   . Pacemaker   . Numbness in feet     PAST SURGICAL HISTORY: Past Surgical History  Procedure Laterality Date  . Pace maker      FAMILY HISTORY: Family History  Problem Relation Age of Onset  . Alzheimer's disease Mother   . Heart attack Father     SOCIAL HISTORY:  History   Social History  . Marital Status: Divorced    Spouse Name: N/A    Number of Children: 2  . Years of Education: college   Occupational History    Government social research officer   Social History Main Topics  . Smoking status: Never Smoker   . Smokeless tobacco: Never Used  .  Alcohol Use: No  . Drug Use: No  . Sexual Activity: Not on file   Other Topics Concern  . Not on file   Social History Narrative   Patient lives at home alone with his girlfriend.   Retired.    Education. College education.   Right handed.   Caffeine None.     PHYSICAL EXAM   Filed Vitals:   09/30/14 1016  BP: 134/87  Pulse: 76  Height: 5\' 9"  (1.753 m)  Weight: 175 lb (79.379 kg)    Not recorded      Body mass index is 25.83 kg/(m^2).   Generalized: In no acute distress  Neck: Supple, no carotid bruits   Cardiac: Regular rate rhythm  Pulmonary: Clear to auscultation bilaterally  Musculoskeletal: No deformity  Neurological examination  Mentation: Alert oriented to time, place, history taking, and causual conversation  Cranial nerve II-XII: Pupils  were equal round reactive to light. Extraocular movements were full.  Visual field were full on confrontational test. Bilateral fundi were sharp.  Facial sensation and strength were normal. Hearing was intact to finger rubbing bilaterally. Uvula tongue midline.  Head turning and shoulder shrug and were normal and symmetric.Tongue protrusion into cheek strength was normal.  Motor: Normal tone, bulk and strength.  Sensor: Mildly length dependent decreased light touch, pinprick to ankle level, absent ibratory sensation at his toes,  Coordination: Normal finger to nose, heel-to-shin bilaterally there was no truncal ataxia  Gait: Rising up from seated position without assistance, normal stance, without trunk ataxia, moderate stride, good arm swing, smooth turning, able to perform tiptoe, and heel walking without difficulty.   Romberg signs: Negative  Deep tendon reflexes: Brachioradialis 2/2, biceps 2/2, triceps 2/2, patellar 2/2, Achilles  1-1, plantar responses were flexor bilaterally.   DIAGNOSTIC DATA (LABS, IMAGING, TESTING) - I reviewed patient records, labs, notes, testing and imaging myself where available.   ASSESSMENT AND PLAN  AVON MERGENTHALER is a 80 y.o. male presenting with few months history of bilateral feet paresthesia, history, exam, and electrodiagnostic study consistent with mild axonal peripheral neuropathy   Laboratory evaluation to rule out treatable cause  Return to clinic in one month   Marcial Pacas, M.D. Ph.D.  [facility] [address], [place], [place] [address] [phone]

## 2014-10-01 LAB — CBC WITH DIFFERENTIAL
Basophils Absolute: 0 10*3/uL (ref 0.0–0.2)
Basos: 0 %
EOS ABS: 0.1 10*3/uL (ref 0.0–0.4)
Eos: 2 %
HEMATOCRIT: 42.4 % (ref 37.5–51.0)
HEMOGLOBIN: 14.5 g/dL (ref 12.6–17.7)
Immature Grans (Abs): 0 10*3/uL (ref 0.0–0.1)
Immature Granulocytes: 0 %
Lymphocytes Absolute: 2.6 10*3/uL (ref 0.7–3.1)
Lymphs: 44 %
MCH: 30.7 pg (ref 26.6–33.0)
MCHC: 34.2 g/dL (ref 31.5–35.7)
MCV: 90 fL (ref 79–97)
MONOS ABS: 0.5 10*3/uL (ref 0.1–0.9)
Monocytes: 8 %
NEUTROS ABS: 2.7 10*3/uL (ref 1.4–7.0)
NEUTROS PCT: 46 %
Platelets: 144 10*3/uL — ABNORMAL LOW (ref 150–379)
RBC: 4.72 x10E6/uL (ref 4.14–5.80)
RDW: 14.6 % (ref 12.3–15.4)
WBC: 5.9 10*3/uL (ref 3.4–10.8)

## 2014-10-01 LAB — IFE AND PE, SERUM
ALBUMIN SERPL ELPH-MCNC: 4 g/dL (ref 3.2–5.6)
Albumin/Glob SerPl: 1.5 (ref 0.7–2.0)
Alpha 1: 0.2 g/dL (ref 0.1–0.4)
Alpha2 Glob SerPl Elph-Mcnc: 0.7 g/dL (ref 0.4–1.2)
B-GLOBULIN SERPL ELPH-MCNC: 1.2 g/dL (ref 0.6–1.3)
GLOBULIN, TOTAL: 2.8 g/dL (ref 2.0–4.5)
Gamma Glob SerPl Elph-Mcnc: 0.7 g/dL (ref 0.5–1.6)
IgA/Immunoglobulin A, Serum: 239 mg/dL (ref 91–414)
IgG (Immunoglobin G), Serum: 670 mg/dL — ABNORMAL LOW (ref 700–1600)
IgM (Immunoglobulin M), Srm: 16 mg/dL — ABNORMAL LOW (ref 40–230)

## 2014-10-01 LAB — THYROID PANEL WITH TSH
FREE THYROXINE INDEX: 3 (ref 1.2–4.9)
T3 UPTAKE RATIO: 30 % (ref 24–39)
T4 TOTAL: 9.9 ug/dL (ref 4.5–12.0)
TSH: 2.2 u[IU]/mL (ref 0.450–4.500)

## 2014-10-01 LAB — COMPREHENSIVE METABOLIC PANEL
ALT: 15 IU/L (ref 0–44)
AST: 30 IU/L (ref 0–40)
Albumin/Globulin Ratio: 2.1 (ref 1.1–2.5)
Albumin: 4.6 g/dL (ref 3.5–4.8)
Alkaline Phosphatase: 69 IU/L (ref 39–117)
BUN/Creatinine Ratio: 10 (ref 10–22)
BUN: 12 mg/dL (ref 8–27)
CALCIUM: 9.9 mg/dL (ref 8.6–10.2)
CO2: 25 mmol/L (ref 18–29)
Chloride: 102 mmol/L (ref 97–108)
Creatinine, Ser: 1.23 mg/dL (ref 0.76–1.27)
GFR calc Af Amer: 64 mL/min/{1.73_m2} (ref >59)
GFR, EST NON AFRICAN AMERICAN: 55 mL/min/{1.73_m2} — AB (ref >59)
Globulin, Total: 2.2 g/dL (ref 1.5–4.5)
Glucose: 98 mg/dL (ref 65–99)
Potassium: 4.3 mmol/L (ref 3.5–5.2)
Sodium: 142 mmol/L (ref 134–144)
Total Bilirubin: 0.4 mg/dL (ref 0.0–1.2)
Total Protein: 6.8 g/dL (ref 6.0–8.5)

## 2014-10-01 LAB — CK: CK TOTAL: 124 U/L (ref 24–204)

## 2014-10-01 LAB — HGB A1C W/O EAG: Hgb A1c MFr Bld: 6 % — ABNORMAL HIGH (ref 4.8–5.6)

## 2014-10-01 LAB — VITAMIN B12: VITAMIN B 12: 1341 pg/mL — AB (ref 211–946)

## 2014-10-01 LAB — ANA W/REFLEX IF POSITIVE: ANA: NEGATIVE

## 2014-10-01 NOTE — Progress Notes (Signed)
Quick Note:  Mild elevated A1c, continual follow-up with his primary care physician, I have released laboratory result to my chart ______

## 2014-10-09 ENCOUNTER — Ambulatory Visit (INDEPENDENT_AMBULATORY_CARE_PROVIDER_SITE_OTHER): Payer: Medicare Other | Admitting: *Deleted

## 2014-10-09 DIAGNOSIS — I495 Sick sinus syndrome: Secondary | ICD-10-CM | POA: Diagnosis not present

## 2014-10-09 DIAGNOSIS — Z950 Presence of cardiac pacemaker: Secondary | ICD-10-CM | POA: Diagnosis not present

## 2014-10-09 NOTE — Progress Notes (Signed)
Remote pacemaker transmission.   

## 2014-10-15 DIAGNOSIS — H02051 Trichiasis without entropian right upper eyelid: Secondary | ICD-10-CM | POA: Diagnosis not present

## 2014-10-15 DIAGNOSIS — H0289 Other specified disorders of eyelid: Secondary | ICD-10-CM | POA: Diagnosis not present

## 2014-10-15 LAB — MDC_IDC_ENUM_SESS_TYPE_REMOTE
Brady Statistic AP VS Percent: 97.5 %
Brady Statistic RA Percent Paced: 97.72 %
Brady Statistic RV Percent Paced: 0.26 %
Date Time Interrogation Session: 20151210174415
Lead Channel Impedance Value: 340 Ohm
Lead Channel Sensing Intrinsic Amplitude: 2.3565
Lead Channel Setting Pacing Amplitude: 2 V
Lead Channel Setting Pacing Amplitude: 3 V
MDC IDC MSMT BATTERY VOLTAGE: 2.97 V
MDC IDC MSMT LEADCHNL RA IMPEDANCE VALUE: 496 Ohm
MDC IDC MSMT LEADCHNL RA SENSING INTR AMPL: 2.5978
MDC IDC SET LEADCHNL RV PACING PULSEWIDTH: 0.8 ms
MDC IDC SET LEADCHNL RV SENSING SENSITIVITY: 0.9 mV
MDC IDC STAT BRADY AP VP PERCENT: 0.21 %
MDC IDC STAT BRADY AS VP PERCENT: 0.04 %
MDC IDC STAT BRADY AS VS PERCENT: 2.24 %
Zone Setting Detection Interval: 350 ms
Zone Setting Detection Interval: 400 ms

## 2014-11-03 ENCOUNTER — Encounter: Payer: Self-pay | Admitting: Cardiology

## 2014-11-11 ENCOUNTER — Encounter: Payer: Self-pay | Admitting: Neurology

## 2014-11-11 ENCOUNTER — Ambulatory Visit (INDEPENDENT_AMBULATORY_CARE_PROVIDER_SITE_OTHER): Payer: Medicare Other | Admitting: Neurology

## 2014-11-11 VITALS — BP 129/79 | HR 77 | Ht 69.0 in | Wt 170.0 lb

## 2014-11-11 DIAGNOSIS — G609 Hereditary and idiopathic neuropathy, unspecified: Secondary | ICD-10-CM

## 2014-11-11 DIAGNOSIS — R208 Other disturbances of skin sensation: Secondary | ICD-10-CM | POA: Diagnosis not present

## 2014-11-11 DIAGNOSIS — G629 Polyneuropathy, unspecified: Secondary | ICD-10-CM | POA: Diagnosis not present

## 2014-11-11 DIAGNOSIS — R200 Anesthesia of skin: Secondary | ICD-10-CM

## 2014-11-11 NOTE — Progress Notes (Signed)
PATIENT: David Booth  HISTORICAL  David Booth is a 81 yo RH AAM, was referred by Dr. Criss Rosales for EMG/NCS in October 1st 2015, which has demonstrated mild axonal peripheral neuropathy, he came in to follow-up for his peripheral neuropathy  He had a past medical history of pacemaker in 2011, presented with tachycardia then  Since July 2015, he noticed bilateral plantar feet paresthesia, mainly involving ball of his feet, he denies gait difficulty, no significant low back pain, no hands paresthesia,  He has been exercise regularly, but over the past few months, he has to deal with his significant other's, who has suffered metastatic lung cancer numbness,  He denies significant neck pain, no low back pain, he was giving a sample of Lyrica, but he does not think his symptoms is severe enough for him to committed to take daily medications, he denies significant pain, mainly numbness, at the bottom of his feet   UPDATE Nov 11 2014: His girl friend went to hospice, he visited her regularly  He continues to have mild bilateral feet paresthesia, which has improved as well, with a local cream, he complains of frequent bilateral hands, feet, leg muscle cramping, especially with minimum exertion, mild unsteady gait, he has hyperreflexia on examinations,  Laboratory showed elevated A1c 6.0, otherwise normal TSH, C-reactive protein, ANA, protein electrophoresis, B12  REVIEW OF SYSTEMS: Full 14 system review of systems performed and notable only fas above  ALLERGIES: Allergies  Allergen Reactions  . Bee Venom     HOME MEDICATIONS: Current Outpatient Prescriptions on File Prior to Visit  Medication Sig Dispense Refill  . aspirin 81 MG tablet Take 81 mg by mouth daily.      Marland Kitchen b complex vitamins tablet Take 1 tablet by mouth daily.    . Cholecalciferol (VITAMIN D3) 2000 UNITS TABS Take 1 tablet by mouth daily.     . Coenzyme Q10 (CO Q 10) 100 MG CAPS Take 1 capsule by mouth  daily.     Marland Kitchen diltiazem (CARDIZEM CD) 120 MG 24 hr capsule TAKE 1 CAPSULE (120 MG TOTAL) BY MOUTH DAILY. 30 capsule 6  . EPINEPHrine (EPI-PEN) 0.3 mg/0.3 mL DEVI Inject into the muscle as needed.    . Glucosamine-Chondroit-Vit C-Mn (GLUCOSAMINE CHONDR 1500 COMPLX) CAPS Take 1 capsule by mouth 2 (two) times daily.    . Lycopene 10 MG CAPS Take by mouth daily.    . metoprolol tartrate (LOPRESSOR) 25 MG tablet Take 1 tablet (25 mg total) by mouth 2 (two) times daily. 60 tablet 10  . Multiple Vitamin (MULTIVITAMIN) tablet Take 1 tablet by mouth daily.    . Omega-3 Fatty Acids (FISH OIL) 1200 MG CAPS Take by mouth daily.    . vitamin C (ASCORBIC ACID) 250 MG tablet Take 250 mg by mouth daily.    . vitamin E (VITAMIN E) 400 UNIT capsule Take 400 Units by mouth 3 (three) times daily.      No current facility-administered medications on file prior to visit.    PAST MEDICAL HISTORY: Past Medical History  Diagnosis Date  . Coronary artery disease   . Hypertension   . Pacemaker   . Numbness in feet   . Neuropathy     PAST SURGICAL HISTORY: Past Surgical History  Procedure Laterality Date  . Pace maker      FAMILY HISTORY: Family History  Problem Relation Age of Onset  . Alzheimer's disease Mother   . Heart attack Father  SOCIAL HISTORY:  History   Social History  . Marital Status: Divorced    Spouse Name: N/A    Number of Children: 2  . Years of Education: college   Occupational History    Government social research officer   Social History Main Topics  . Smoking status: Never Smoker   . Smokeless tobacco: Never Used  . Alcohol Use: No  . Drug Use: No  . Sexual Activity: Not on file   Other Topics Concern  . Not on file   Social History Narrative   Patient lives at home alone with his girlfriend.   Retired.    Education. College education.   Right handed.   Caffeine None.     PHYSICAL EXAM   Filed Vitals:   11/11/14 1407  BP: 129/79  Pulse: 77  Height: 5\' 9"   (1.753 m)  Weight: 170 lb (77.111 kg)    Not recorded      Body mass index is 25.09 kg/(m^2).   Generalized: In no acute distress  Neck: Supple, no carotid bruits   Cardiac: Regular rate rhythm  Pulmonary: Clear to auscultation bilaterally  Musculoskeletal: No deformity  Neurological examination  Mentation: Alert oriented to time, place, history taking, and causual conversation  Cranial nerve II-XII: Pupils were equal round reactive to light. Extraocular movements were full.  Visual field were full on confrontational test. Bilateral fundi were sharp.  Facial sensation and strength were normal. Hearing was intact to finger rubbing bilaterally. Uvula tongue midline.  Head turning and shoulder shrug and were normal and symmetric.Tongue protrusion into cheek strength was normal.  Motor: Normal tone, bulk and strength.  Sensor: Mildly length dependent decreased light touch, pinprick to ankle level, absent ibratory sensation at his toes,  Coordination: Normal finger to nose, heel-to-shin bilaterally there was no truncal ataxia  Gait: Rising up from seated position without assistance, normal stance, without trunk ataxia, mild stiff Romberg signs: Negative  Deep tendon reflexes: Brachioradialis 3/3, biceps 3/3, triceps 3/3 patellar 3/3, Achilles 2/2, plantar responses were extensor bilaterally.   DIAGNOSTIC DATA (LABS, IMAGING, TESTING) - I reviewed patient records, labs, notes, testing and imaging myself where available.   ASSESSMENT AND PLAN  David Booth is a 81 y.o. male presenting with few months history of bilateral feet paresthesia, history, exam, and electrodiagnostic study consistent with mild axonal peripheral neuropathy, he also has hyperreflexia,   His peripheral neuropathy most likely due to abnormal glucose, A1c 6.0 MRI of cervical spine to rule out cervical spondylitic myelopathy Return to clinic in 2-3 months  Marcial Pacas, M.D. Ph.D.  [facility] [address], [place], [place] [address] [phone]

## 2014-11-13 ENCOUNTER — Telehealth: Payer: Self-pay

## 2014-11-13 ENCOUNTER — Encounter: Payer: Self-pay | Admitting: Cardiovascular Disease

## 2014-11-13 DIAGNOSIS — R202 Paresthesia of skin: Secondary | ICD-10-CM

## 2014-11-13 DIAGNOSIS — G609 Hereditary and idiopathic neuropathy, unspecified: Secondary | ICD-10-CM

## 2014-11-13 DIAGNOSIS — R200 Anesthesia of skin: Secondary | ICD-10-CM

## 2014-11-13 NOTE — Telephone Encounter (Signed)
Rene: Please let patients now, I have changed MRI to CAT scan of cervical spine

## 2014-11-13 NOTE — Telephone Encounter (Signed)
[facility] called letting us know that the patient could not have the MRI C-Spine without contrast because he has a [employer] .  Would you like for him to have a CT Scan? If so, please place the order in EPIC.

## 2014-11-13 NOTE — Addendum Note (Signed)
Addended by: Marcial Pacas on: 11/13/2014 11:01 AM   Modules accepted: Orders

## 2014-11-17 ENCOUNTER — Encounter: Payer: Self-pay | Admitting: Cardiology

## 2014-11-18 ENCOUNTER — Ambulatory Visit
Admission: RE | Admit: 2014-11-18 | Discharge: 2014-11-18 | Disposition: A | Discharge status: Home / Self Care | Payer: Medicare Other | Source: Ambulatory Visit | Attending: Neurology | Admitting: Neurology

## 2014-11-18 DIAGNOSIS — M47812 Spondylosis without myelopathy or radiculopathy, cervical region: Secondary | ICD-10-CM | POA: Diagnosis not present

## 2014-11-18 DIAGNOSIS — R202 Paresthesia of skin: Secondary | ICD-10-CM

## 2014-11-18 DIAGNOSIS — G609 Hereditary and idiopathic neuropathy, unspecified: Secondary | ICD-10-CM

## 2014-11-18 DIAGNOSIS — R200 Anesthesia of skin: Secondary | ICD-10-CM

## 2014-12-18 ENCOUNTER — Other Ambulatory Visit: Payer: Self-pay

## 2014-12-18 MED ORDER — METOPROLOL TARTRATE 25 MG PO TABS: 25.0000 mg | ORAL_TABLET | Freq: Two times a day (BID) | ORAL | Status: DC

## 2014-12-18 MED ORDER — DILTIAZEM HCL ER COATED BEADS 120 MG PO CP24: 120.0000 mg | ORAL_CAPSULE | Freq: Every day | ORAL | Status: DC

## 2014-12-18 NOTE — Telephone Encounter (Signed)
Rx(s) sent to pharmacy electronically.  

## 2014-12-18 NOTE — Addendum Note (Signed)
Addended by: Diana Eves on: 12/18/2014 12:31 PM   Modules accepted: Orders

## 2015-01-15 ENCOUNTER — Ambulatory Visit (INDEPENDENT_AMBULATORY_CARE_PROVIDER_SITE_OTHER): Payer: Medicare Other | Admitting: Neurology

## 2015-01-15 ENCOUNTER — Encounter: Payer: Self-pay | Admitting: Neurology

## 2015-01-15 VITALS — BP 131/89 | HR 73 | Ht 69.0 in | Wt 167.0 lb

## 2015-01-15 DIAGNOSIS — G629 Polyneuropathy, unspecified: Secondary | ICD-10-CM

## 2015-01-15 DIAGNOSIS — R208 Other disturbances of skin sensation: Secondary | ICD-10-CM

## 2015-01-15 DIAGNOSIS — R200 Anesthesia of skin: Secondary | ICD-10-CM

## 2015-01-15 NOTE — Progress Notes (Signed)
PATIENT: David Booth DOB: Jul 16, 1933  HISTORICAL  David Booth is a 81 yo RH AAM, was referred by Dr. Criss Rosales for EMG/NCS in October 1st 2015, which has demonstrated mild axonal peripheral neuropathy, he came in to follow-up for his peripheral neuropathy  He had a past medical history of pacemaker in 2011, presented with tachycardia then  Since July 2015, he noticed bilateral plantar feet paresthesia, mainly involving ball of his feet, he denies gait difficulty, no significant low back pain, no hands paresthesia,  He has been exercise regularly, but over the past few months, he has to deal with his significant other's, who has suffered metastatic lung cancer numbness,  He denies significant neck pain, no low back pain, he was given a sample of Lyrica, but he does not think his symptoms is severe enough for him to committed to take daily medications, he denies significant pain, mainly numbness, at the bottom of his feet   UPDATE Nov 11 2014: He continues to have mild bilateral feet paresthesia, which has improved as well, with a local cream, he complains of frequent bilateral hands, feet, leg muscle cramping, especially with minimum exertion, mild unsteady gait, he has hyperreflexia on examinations,  Laboratory showed elevated A1c 6.0, otherwise normal TSH, C-reactive protein, ANA, protein electrophoresis, B12  EMG/NCS in Oct 2015 showed electrodiagnostic evidence of mild length dependent axonal, sensory dominant  peripheral neuropathy. There was no evidence of right lumbar  radiculopathy.  UPDATE March 17th 2016: His feet paresthesia has much improved, he went on the trip, he can walk 5 miles without difficulty,   We have reviewed CT cervical: Mild spondylosis of the cervical spine. The central canal appears open at all levels with mild foraminal narrowing due to uncovertebral disease noted at C5-6.   REVIEW OF SYSTEMS: Full 14 system review of systems performed and notable only fas  above  ALLERGIES: Allergies  Allergen Reactions  . Bee Venom     HOME MEDICATIONS: Current Outpatient Prescriptions on File Prior to Visit  Medication Sig Dispense Refill  . aspirin 81 MG tablet Take 81 mg by mouth daily.      Marland Kitchen b complex vitamins tablet Take 1 tablet by mouth daily.    . Cholecalciferol (VITAMIN D3) 2000 UNITS TABS Take 1 tablet by mouth daily.     . Coenzyme Q10 (CO Q 10) 100 MG CAPS Take 1 capsule by mouth daily.     Marland Kitchen diltiazem (CARDIZEM CD) 120 MG 24 hr capsule Take 1 capsule (120 mg total) by mouth daily. 90 capsule 1  . EPINEPHrine (EPI-PEN) 0.3 mg/0.3 mL DEVI Inject into the muscle as needed.    . Glucosamine-Chondroit-Vit C-Mn (GLUCOSAMINE CHONDR 1500 COMPLX) CAPS Take 1 capsule by mouth 2 (two) times daily.    . Lycopene 10 MG CAPS Take by mouth daily.    . metoprolol tartrate (LOPRESSOR) 25 MG tablet Take 1 tablet (25 mg total) by mouth 2 (two) times daily. 180 tablet 1  . Multiple Vitamin (MULTIVITAMIN) tablet Take 1 tablet by mouth daily.    . Omega-3 Fatty Acids (FISH OIL) 1200 MG CAPS Take by mouth daily.    . vitamin C (ASCORBIC ACID) 250 MG tablet Take 250 mg by mouth daily.    . vitamin E (VITAMIN E) 400 UNIT capsule Take 400 Units by mouth 3 (three) times daily.      No current facility-administered medications on file prior to visit.    PAST MEDICAL HISTORY: Past Medical History  Diagnosis Date  . Coronary artery disease   . Hypertension   . Pacemaker   . Numbness in feet   . Neuropathy     PAST SURGICAL HISTORY: Past Surgical History  Procedure Laterality Date  . Pace maker      FAMILY HISTORY: Family History  Problem Relation Age of Onset  . Alzheimer's disease Mother   . Heart attack Father     SOCIAL HISTORY:  History   Social History  . Marital Status: Divorced    Spouse Name: N/A    Number of Children: 2  . Years of Education: college   Occupational History    Government social research officer   Social History Main  Topics  . Smoking status: Never Smoker   . Smokeless tobacco: Never Used  . Alcohol Use: No  . Drug Use: No  . Sexual Activity: Not on file   Other Topics Concern  . Not on file   Social History Narrative   Patient lives at home alone with his girlfriend.   Retired.    Education. College education.   Right handed.   Caffeine None.     PHYSICAL EXAM   Filed Vitals:   01/15/15 1441  BP: 131/89  Pulse: 73  Height: 5\' 9"  (1.753 m)  Weight: 167 lb (75.751 kg)    Not recorded      Body mass index is 24.65 kg/(m^2).  PHYSICAL EXAMNIATION:  Gen: NAD, conversant, well nourised, obese, well groomed                     Cardiovascular: Regular rate rhythm, no peripheral edema, warm, nontender. Eyes: Conjunctivae clear without exudates or hemorrhage Neck: Supple, no carotid bruise. Pulmonary: Clear to auscultation bilaterally   NEUROLOGICAL EXAM:  MENTAL STATUS: Speech:    Speech is normal; fluent and spontaneous with normal comprehension.  Cognition:    The patient is oriented to person, place, and time;     recent and remote memory intact;     language fluent;     normal attention, concentration,     fund of knowledge.  CRANIAL NERVES: CN II: Visual fields are full to confrontation. Fundoscopic exam is normal with sharp discs and no vascular changes. Venous pulsations are present bilaterally. Pupils are 4 mm and briskly reactive to light. Visual acuity is 20/20 bilaterally. CN III, IV, VI: extraocular movement are normal. No ptosis. CN V: Facial sensation is intact to pinprick in all 3 divisions bilaterally. Corneal responses are intact.  CN VII: Face is symmetric with normal eye closure and smile. CN VIII: Hearing is normal to rubbing fingers CN IX, X: Palate elevates symmetrically. Phonation is normal. CN XI: Head turning and shoulder shrug are intact CN XII: Tongue is midline with normal movements and no atrophy.  MOTOR: There is no pronator drift of  out-stretched arms. Muscle bulk and tone are normal. Muscle strength is normal.   Shoulder abduction Shoulder external rotation Elbow flexion Elbow extension Wrist flexion Wrist extension Finger abduction Hip flexion Knee flexion Knee extension Ankle dorsi flexion Ankle plantar flexion  R 5 5 5 5 5 5 5 5 5 5 5 5   L 5 5 5 5 5 5 5 5 5 5 5 5     REFLEXES: Reflexes are 2+ and symmetric at the biceps, triceps, knees, and ankles. Plantar responses are flexor.  SENSORY: Light touch, pinprick, decreased vibration sense  In the mid shin.  COORDINATION: Rapid alternating movements and fine finger movements are  intact. There is no dysmetria on finger-to-nose and heel-knee-shin. There are no abnormal or extraneous movements.   GAIT/STANCE: Posture is normal. Gait is steady with normal steps, base, arm swing, and turning. Heel and toe walking are normal. Tandem gait is normal.  Romberg is absent.   DIAGNOSTIC DATA (LABS, IMAGING, TESTING) - I reviewed patient records, labs, notes, testing and imaging myself where available.   ASSESSMENT AND PLAN  David Booth is a 81 y.o. male presenting with few months history of bilateral feet paresthesia, history, exam, and electrodiagnostic study consistent with mild axonal peripheral neuropathy, he also has hyperreflexia,   His mild peripheral neuropathy most likely due to abnormal glucose, A1c 6.0 No significant gait difficulty, CT cervical showed mild degenerative disc disease, no evidence of canal stenosis, Continued to exercise regularly, return to clinic for new issues  Marcial Pacas, M.D. Ph.D.  [facility] [address] [place], [place] [address] [phone]

## 2015-03-01 ENCOUNTER — Other Ambulatory Visit: Payer: Self-pay | Admitting: Cardiovascular Disease

## 2015-03-02 NOTE — Telephone Encounter (Signed)
Rx(s) sent to pharmacy electronically.  

## 2015-03-27 ENCOUNTER — Encounter: Payer: Self-pay | Admitting: *Deleted

## 2015-04-07 ENCOUNTER — Ambulatory Visit (INDEPENDENT_AMBULATORY_CARE_PROVIDER_SITE_OTHER): Payer: Medicare Other | Admitting: Cardiovascular Disease

## 2015-04-07 ENCOUNTER — Encounter: Payer: Self-pay | Admitting: Cardiovascular Disease

## 2015-04-07 VITALS — BP 124/77 | HR 69 | Ht 69.0 in | Wt 168.7 lb

## 2015-04-07 DIAGNOSIS — I495 Sick sinus syndrome: Secondary | ICD-10-CM | POA: Diagnosis not present

## 2015-04-07 DIAGNOSIS — T82190D Other mechanical complication of cardiac electrode, subsequent encounter: Secondary | ICD-10-CM

## 2015-04-07 DIAGNOSIS — I4510 Unspecified right bundle-branch block: Secondary | ICD-10-CM

## 2015-04-07 DIAGNOSIS — T82110D Breakdown (mechanical) of cardiac electrode, subsequent encounter: Secondary | ICD-10-CM

## 2015-04-07 DIAGNOSIS — Z950 Presence of cardiac pacemaker: Secondary | ICD-10-CM

## 2015-04-07 LAB — CUP PACEART INCLINIC DEVICE CHECK
Battery Voltage: 2.97 V
Brady Statistic AP VP Percent: 1.3 %
Brady Statistic AS VP Percent: 0.1 % — CL
Lead Channel Impedance Value: 488 Ohm
Lead Channel Pacing Threshold Amplitude: 1 V
Lead Channel Pacing Threshold Amplitude: 1.5 V
Lead Channel Pacing Threshold Pulse Width: 0.8 ms
Lead Channel Sensing Intrinsic Amplitude: 2.7 mV
Lead Channel Setting Pacing Amplitude: 2 V
Lead Channel Setting Pacing Pulse Width: 0.8 ms
Lead Channel Setting Sensing Sensitivity: 1.2 mV
MDC IDC MSMT LEADCHNL RA PACING THRESHOLD PULSEWIDTH: 0.4 ms
MDC IDC MSMT LEADCHNL RV IMPEDANCE VALUE: 332 Ohm
MDC IDC MSMT LEADCHNL RV SENSING INTR AMPL: 2.7 mV
MDC IDC SESS DTM: 20160607143649
MDC IDC SET LEADCHNL RV PACING AMPLITUDE: 3 V
MDC IDC SET ZONE DETECTION INTERVAL: 350 ms
MDC IDC STAT BRADY AP VS PERCENT: 96.7 %
MDC IDC STAT BRADY AS VS PERCENT: 2 %
Zone Setting Detection Interval: 400 ms

## 2015-04-07 NOTE — Progress Notes (Signed)
Patient ID: David Booth, male   DOB: 08/10/1933, 81 y.o.   MRN: [id number]     Cardiology Office Note   Date:  04/07/2015   ID:  David Booth, DOB [birth year], MRN [id number]  PCP:  No PCP Per Patient  Cardiologist:   Sanda Klein, MD   Chief Complaint  Patient presents with  . Annual Exam    patient reports numbness in feet-has been told that he is pre-diabetic, otherwise, no complaints.  . Question    patient would like to buy a car - electric car - will that affect his device?  . PCP    patient would like recommendation for PCP      History of Present Illness: David Booth is a 81 y.o. male who presents for pacemaker follow-up. He has no cardiovascular complaints and his only issues surround lower extremity peripheral neuropathy. He is seen Dr. Krista Blue in the neurology clinic for this. He has also been diagnosed with borderline diabetes mellitus with a hemoglobin A1c of 5.9%. His fiance has unfortunately passed away.  He has a dual chamber permanent pacemaker implant in April 2011 for sinus bradycardia, AV conduction disease and need for medications for paroxysmal atrial tachycardia. His device is an MRI conditional Medtronic revo dual chamber device. Despite a very long AV conduction time (PR interval 394 ms), He continues to have minimal (1.3%) need for ventricular pacing with the device programmed MVP. He has a long-standing right bundle branch block. There is 98% atrial pacing. Underlying rhythm is sinus bradycardia at 36 beats per minute. Lead parameters are adequate, with a chronically low sensed R-wave of only 2.7 mV. He also has a relatively high ventricular pacing threshold at 1.0 V@ 0.8 ms. The deterioration in these parameters (which were excellent at implant) seems to have stabilized. Due to the low sensitivity threshold that we have had to set, however, we have now encountered a few episodes of T-wave oversensing (never over 8 seconds in duration). Sensitivity threshold today was  increased to 1.2 mV to avoid this. No episodes of atrial tachycardia have been recorded. The 3 episodes of so-called nonsustained VT are all T-wave over sensing.  Past Medical History  Diagnosis Date  . Coronary artery disease   . Hypertension   . Pacemaker   . Numbness in feet   . Neuropathy     Past Surgical History  Procedure Laterality Date  . Pace maker       Current Outpatient Prescriptions  Medication Sig Dispense Refill  . Alpha-Lipoic Acid 100 MG TABS Take 1 tablet by mouth 3 (three) times daily.    Marland Kitchen aspirin 81 MG tablet Take 81 mg by mouth daily.      Marland Kitchen b complex vitamins tablet Take 1 tablet by mouth daily.    . Cholecalciferol (VITAMIN D3) 2000 UNITS TABS Take 1 tablet by mouth daily.     . Coenzyme Q10 (CO Q 10) 100 MG CAPS Take 1 capsule by mouth daily.     Marland Kitchen diltiazem (CARDIZEM CD) 120 MG 24 hr capsule Take 1 capsule (120 mg total) by mouth daily. 30 capsule 7  . EPINEPHrine (EPI-PEN) 0.3 mg/0.3 mL DEVI Inject into the muscle as needed.    . Glucosamine-Chondroit-Vit C-Mn (GLUCOSAMINE CHONDR 1500 COMPLX) CAPS Take 1 capsule by mouth 2 (two) times daily.    . Lycopene 10 MG CAPS Take by mouth daily.    Marland Kitchen MAGNESIUM PO Take 350 mg by mouth daily.    . metoprolol  tartrate (LOPRESSOR) 25 MG tablet Take 1 tablet (25 mg total) by mouth 2 (two) times daily. 180 tablet 1  . Multiple Vitamin (MULTIVITAMIN) tablet Take 1 tablet by mouth daily.    . Omega-3 Fatty Acids (FISH OIL) 1200 MG CAPS Take by mouth daily.    . vitamin C (ASCORBIC ACID) 250 MG tablet Take 250 mg by mouth daily.    . vitamin E (VITAMIN E) 400 UNIT capsule Take 400 Units by mouth 3 (three) times daily.      No current facility-administered medications for this visit.    Allergies:   Bee venom    Social History:  The patient  reports that he has never smoked. He has never used smokeless tobacco. He reports that he does not drink alcohol or use illicit drugs.   Family History:  The patient's family  history includes Alzheimer's disease in his mother; Heart attack in his father.    ROS:  Please see the history of present illness.    Otherwise, review of systems positive for lower extremity numbness and tingling.   All other systems are reviewed and negative.    PHYSICAL EXAM: VS:  BP 124/77 mmHg  Pulse 69  Ht 5\' 9"  (1.753 m)  Wt 76.522 kg (168 lb 11.2 oz)  BMI 24.90 kg/m2 , BMI Body mass index is 24.9 kg/(m^2).  General: Alert, oriented x3, no distress Head: no evidence of trauma, PERRL, EOMI, no exophtalmos or lid lag, no myxedema, no xanthelasma; normal ears, nose and oropharynx Neck: normal jugular venous pulsations and no hepatojugular reflux; brisk carotid pulses without delay and no carotid bruits Chest: clear to auscultation, no signs of consolidation by percussion or palpation, normal fremitus, symmetrical and full respiratory excursions Cardiovascular: normal position and quality of the apical impulse, regular rhythm, normal first and widely split second heart sounds, no murmurs, rubs or gallops Abdomen: no tenderness or distention, no masses by palpation, no abnormal pulsatility or arterial bruits, normal bowel sounds, no hepatosplenomegaly Extremities: no clubbing, cyanosis or edema; 2+ radial, ulnar and brachial pulses bilaterally; 2+ right femoral, posterior tibial and dorsalis pedis pulses; 2+ left femoral, posterior tibial and dorsalis pedis pulses; no subclavian or femoral bruits Neurological: grossly nonfocal, there is very very mild and fine resting tremor in his extremities Psych: euthymic mood, full affect   EKG:  EKG is ordered today. The ekg ordered today demonstrates atrial paced, ventricular sensed, right bundle branch block and very long AV delay   Recent Labs: 09/30/2014: ALT 15; BUN 12; Creatinine 1.23; Hemoglobin 14.5; Platelets 144*; Potassium 4.3; Sodium 142; TSH 2.200    Lipid Panel No results found for: CHOL, TRIG, HDL, CHOLHDL, VLDL, LDLCALC,  LDLDIRECT    Wt Readings from Last 3 Encounters:  04/07/15 76.522 kg (168 lb 11.2 oz)  01/15/15 75.751 kg (167 lb)  11/11/14 77.111 kg (170 lb)       ASSESSMENT AND PLAN:  Mr. Racicot has a ventricular lead with poor sensing of the R-wave and we have had to adjust the sensitivity threshold to avoid T-wave over sensing. The R waves have deteriorated even further from his last check. Hopefully we will be able to compensate with device reprogramming, but if repeated issues occur we may have to convert his device to a single chamber atrial pacemaker. So far he has not had evidence of AV block and has almost never needed ventricular pacing, but there is evidence of AV conduction disease by electrocardiogram. We may even have to consider placement  of a new right ventricular lead, but ideally would like to delay this for another 2-3 years until he needs a generator change out anyway.   Current medicines are reviewed at length with the patient today.  The patient does not have concerns regarding medicines.  The following changes have been made:  no change  Labs/ tests ordered today include:  Orders Placed This Encounter  Procedures  . Implantable device check  . EKG 12-Lead   Patient Instructions  Remote monitoring is used to monitor your Pacemaker or ICD from home. This monitoring reduces the number of office visits required to check your device to one time per year. It allows [place] to monitor the functioning of your device to ensure it is working properly. You are scheduled for a device check from home on July 09, 2015. You may send your transmission at any time that day. If you have a wireless device, the transmission will be sent automatically. After your physician reviews your transmission, you will receive a postcard with your next transmission date.  Your physician recommends that you schedule a follow-up appointment in: One year.      Mikael Spray, MD  04/07/2015 4:53 PM      Sanda Klein, MD, [facility] [phone] office [phone] pager

## 2015-04-07 NOTE — Patient Instructions (Signed)
Remote monitoring is used to monitor your Pacemaker or ICD from home. This monitoring reduces the number of office visits required to check your device to one time per year. It allows [place] to monitor the functioning of your device to ensure it is working properly. You are scheduled for a device check from home on July 09, 2015. You may send your transmission at any time that day. If you have a wireless device, the transmission will be sent automatically. After your physician reviews your transmission, you will receive a postcard with your next transmission date.  Your physician recommends that you schedule a follow-up appointment in: One year.

## 2015-04-08 ENCOUNTER — Telehealth: Payer: Self-pay | Admitting: *Deleted

## 2015-04-08 NOTE — Telephone Encounter (Signed)
Patient notified Dr. Loletha Grayer researched and found that the [facility] specifically tested pacemakers and defibrillators in the [place] and no adverse effects were found and this is likely true of all electric cars.  Patient voiced understanding and appreciation for getting this info for him.

## 2015-04-08 NOTE — Telephone Encounter (Signed)
-----   Message from Sanda Klein, MD sent at 04/07/2015  4:55 PM EDT ----- Please let them know that the [facility] specifically tested pacemakers and defibrillators in the [place] and no adverse effects were found whatsoever. This is likely true of all electric cars.

## 2015-04-10 NOTE — Progress Notes (Signed)
I have reviewed and agreed above plan. 

## 2015-04-24 ENCOUNTER — Encounter: Payer: Self-pay | Admitting: Cardiovascular Disease

## 2015-06-08 DIAGNOSIS — Z950 Presence of cardiac pacemaker: Secondary | ICD-10-CM | POA: Diagnosis not present

## 2015-06-08 DIAGNOSIS — R251 Tremor, unspecified: Secondary | ICD-10-CM | POA: Diagnosis not present

## 2015-06-08 DIAGNOSIS — Z23 Encounter for immunization: Secondary | ICD-10-CM | POA: Diagnosis not present

## 2015-06-08 DIAGNOSIS — G629 Polyneuropathy, unspecified: Secondary | ICD-10-CM | POA: Diagnosis not present

## 2015-06-08 DIAGNOSIS — R001 Bradycardia, unspecified: Secondary | ICD-10-CM | POA: Diagnosis not present

## 2015-06-08 DIAGNOSIS — E559 Vitamin D deficiency, unspecified: Secondary | ICD-10-CM | POA: Diagnosis not present

## 2015-07-07 ENCOUNTER — Ambulatory Visit (INDEPENDENT_AMBULATORY_CARE_PROVIDER_SITE_OTHER): Payer: Medicare Other | Admitting: *Deleted

## 2015-07-07 DIAGNOSIS — I495 Sick sinus syndrome: Secondary | ICD-10-CM | POA: Diagnosis not present

## 2015-07-09 NOTE — Progress Notes (Signed)
Remote pacemaker transmission.   

## 2015-07-13 LAB — CUP PACEART REMOTE DEVICE CHECK
Battery Voltage: 2.96 V
Brady Statistic AP VP Percent: 8.7 %
Brady Statistic AS VP Percent: 0.3 %
Brady Statistic AS VS Percent: 0.8 %
Lead Channel Impedance Value: 328 Ohm
Lead Channel Sensing Intrinsic Amplitude: 2 mV
Lead Channel Sensing Intrinsic Amplitude: 2.7 mV
Lead Channel Setting Pacing Amplitude: 2 V
Lead Channel Setting Pacing Amplitude: 3 V
Lead Channel Setting Pacing Pulse Width: 0.8 ms
Lead Channel Setting Sensing Sensitivity: 1.2 mV
MDC IDC MSMT LEADCHNL RA IMPEDANCE VALUE: 498 Ohm
MDC IDC SESS DTM: 20160912120521
MDC IDC SET ZONE DETECTION INTERVAL: 350 ms
MDC IDC SET ZONE DETECTION INTERVAL: 400 ms
MDC IDC STAT BRADY AP VS PERCENT: 90.2 %

## 2015-07-21 DIAGNOSIS — H02051 Trichiasis without entropian right upper eyelid: Secondary | ICD-10-CM | POA: Diagnosis not present

## 2015-08-05 ENCOUNTER — Encounter: Payer: Self-pay | Admitting: Cardiology

## 2015-08-11 DIAGNOSIS — D696 Thrombocytopenia, unspecified: Secondary | ICD-10-CM | POA: Diagnosis not present

## 2015-08-12 DIAGNOSIS — E785 Hyperlipidemia, unspecified: Secondary | ICD-10-CM | POA: Diagnosis not present

## 2015-08-12 DIAGNOSIS — Z23 Encounter for immunization: Secondary | ICD-10-CM | POA: Diagnosis not present

## 2015-08-12 DIAGNOSIS — R000 Tachycardia, unspecified: Secondary | ICD-10-CM | POA: Diagnosis not present

## 2015-08-12 DIAGNOSIS — D696 Thrombocytopenia, unspecified: Secondary | ICD-10-CM | POA: Diagnosis not present

## 2015-08-12 DIAGNOSIS — R251 Tremor, unspecified: Secondary | ICD-10-CM | POA: Diagnosis not present

## 2015-08-12 DIAGNOSIS — M47812 Spondylosis without myelopathy or radiculopathy, cervical region: Secondary | ICD-10-CM | POA: Diagnosis not present

## 2015-08-18 ENCOUNTER — Encounter: Payer: Self-pay | Admitting: Cardiovascular Disease

## 2015-09-08 ENCOUNTER — Telehealth: Payer: Self-pay | Admitting: Cardiovascular Disease

## 2015-09-08 NOTE — Telephone Encounter (Signed)
Reviewed [employer] transmission, no alerts/episodes, device function appropriate from available data.  Called patient back to let him know and he states that he may have had a bad dream, but that he just wanted to ensure everything was working correctly.  Patient again denies chest pain/pressure/tightness with the "heart thumping" sensation.  He states that the episode was brief and resolved on its own.  Encouraged patient to call with worsening symptoms, questions, or concerns.  He voices understanding and appreciation.  Will make patient aware if Dr. Sallyanne Kuster has any additional recommendations.

## 2015-09-08 NOTE — Telephone Encounter (Signed)
Returned call to patient. He reports last night around 3-4am he thinks he experienced "hard heart beats" that "felt like banging in his chest". He states he woke up to go to the bathroom, when he got up out of bed they stopped. He laid back down on his right side and it started back up - did not occur while on left side or back. He denies chest pain/pressure/tightness. Advised that he send device download in for review to see if there were any rhythm abnormalities that may have occurred to cause him these symptoms.   Routing message to device pool & Dr. Sallyanne Kuster

## 2015-09-08 NOTE — Telephone Encounter (Signed)
Pt said his heart was beating so hard last night,he could feel it in his chest. He never experienced it before. Pr says he thoihjy something might showed on his pacemaker.

## 2015-10-07 ENCOUNTER — Telehealth: Payer: Self-pay | Admitting: Cardiology

## 2015-10-07 ENCOUNTER — Ambulatory Visit (INDEPENDENT_AMBULATORY_CARE_PROVIDER_SITE_OTHER): Payer: Medicare Other | Admitting: *Deleted

## 2015-10-07 DIAGNOSIS — I495 Sick sinus syndrome: Secondary | ICD-10-CM | POA: Diagnosis not present

## 2015-10-07 NOTE — Progress Notes (Signed)
Remote pacemaker transmission.   

## 2015-10-07 NOTE — Telephone Encounter (Signed)
Spoke with pt and reminded pt of remote transmission that is due today. Pt verbalized understanding.   

## 2015-10-15 ENCOUNTER — Other Ambulatory Visit: Payer: Self-pay | Admitting: Cardiovascular Disease

## 2015-10-15 LAB — CUP PACEART REMOTE DEVICE CHECK
Battery Voltage: 2.96 V
Brady Statistic AS VS Percent: 0.59 %
Brady Statistic RA Percent Paced: 99.07 %
Date Time Interrogation Session: 20161207222903
Implantable Lead Implant Date: 20110429
Implantable Lead Location: 753860
Implantable Lead Model: 5086
Lead Channel Impedance Value: 332 Ohm
Lead Channel Sensing Intrinsic Amplitude: 2.381 mV
Lead Channel Setting Pacing Pulse Width: 0.8 ms
Lead Channel Setting Sensing Sensitivity: 1.2 mV
MDC IDC LEAD IMPLANT DT: 20110429
MDC IDC LEAD LOCATION: 753859
MDC IDC LEAD MODEL: 5086
MDC IDC MSMT LEADCHNL RA IMPEDANCE VALUE: 536 Ohm
MDC IDC MSMT LEADCHNL RV SENSING INTR AMPL: 2.02 mV
MDC IDC SET LEADCHNL RA PACING AMPLITUDE: 2 V
MDC IDC SET LEADCHNL RV PACING AMPLITUDE: 3 V
MDC IDC STAT BRADY AP VP PERCENT: 17.54 %
MDC IDC STAT BRADY AP VS PERCENT: 81.54 %
MDC IDC STAT BRADY AS VP PERCENT: 0.33 %
MDC IDC STAT BRADY RV PERCENT PACED: 17.87 %

## 2015-10-15 NOTE — Telephone Encounter (Signed)
Rx(s) sent to pharmacy electronically.  

## 2015-10-21 ENCOUNTER — Encounter: Payer: Self-pay | Admitting: Cardiology

## 2015-10-28 ENCOUNTER — Other Ambulatory Visit: Payer: Self-pay | Admitting: Cardiovascular Disease

## 2015-10-28 NOTE — Telephone Encounter (Signed)
Rx request sent to pharmacy.  

## 2015-11-05 ENCOUNTER — Encounter: Payer: Self-pay | Admitting: Cardiology

## 2016-01-06 ENCOUNTER — Ambulatory Visit: Payer: Medicare Other | Admitting: *Deleted

## 2016-01-08 ENCOUNTER — Encounter: Payer: Self-pay | Admitting: Cardiology

## 2016-01-13 ENCOUNTER — Ambulatory Visit (INDEPENDENT_AMBULATORY_CARE_PROVIDER_SITE_OTHER): Payer: Medicare Other | Admitting: *Deleted

## 2016-01-13 DIAGNOSIS — I495 Sick sinus syndrome: Secondary | ICD-10-CM

## 2016-01-22 NOTE — Progress Notes (Signed)
Remote pacemaker transmission.   

## 2016-01-25 ENCOUNTER — Encounter: Payer: Self-pay | Admitting: Cardiology

## 2016-01-25 LAB — CUP PACEART REMOTE DEVICE CHECK
Brady Statistic RA Percent Paced: 98.83 %
Brady Statistic RV Percent Paced: 8.67 %
Date Time Interrogation Session: 20170315210911
Implantable Lead Implant Date: 20110429
Implantable Lead Implant Date: 20110429
Implantable Lead Location: 753860
Implantable Lead Model: 5086
Implantable Lead Model: 5086
Lead Channel Impedance Value: 324 Ohm
Lead Channel Sensing Intrinsic Amplitude: 4.376 mV
Lead Channel Setting Pacing Amplitude: 2 V
Lead Channel Setting Sensing Sensitivity: 1.2 mV
MDC IDC LEAD LOCATION: 753859
MDC IDC MSMT BATTERY VOLTAGE: 2.96 V
MDC IDC MSMT LEADCHNL RA IMPEDANCE VALUE: 512 Ohm
MDC IDC MSMT LEADCHNL RA SENSING INTR AMPL: 2.381 mV
MDC IDC SET LEADCHNL RV PACING AMPLITUDE: 3 V
MDC IDC SET LEADCHNL RV PACING PULSEWIDTH: 0.8 ms
MDC IDC STAT BRADY AP VP PERCENT: 8.52 %
MDC IDC STAT BRADY AP VS PERCENT: 90.31 %
MDC IDC STAT BRADY AS VP PERCENT: 0.15 %
MDC IDC STAT BRADY AS VS PERCENT: 1.02 %

## 2016-02-17 DIAGNOSIS — M47812 Spondylosis without myelopathy or radiculopathy, cervical region: Secondary | ICD-10-CM | POA: Diagnosis not present

## 2016-02-17 DIAGNOSIS — Z136 Encounter for screening for cardiovascular disorders: Secondary | ICD-10-CM | POA: Diagnosis not present

## 2016-02-17 DIAGNOSIS — R000 Tachycardia, unspecified: Secondary | ICD-10-CM | POA: Diagnosis not present

## 2016-02-17 DIAGNOSIS — Z0000 Encounter for general adult medical examination without abnormal findings: Secondary | ICD-10-CM | POA: Diagnosis not present

## 2016-02-17 DIAGNOSIS — R001 Bradycardia, unspecified: Secondary | ICD-10-CM | POA: Diagnosis not present

## 2016-02-18 DIAGNOSIS — H0289 Other specified disorders of eyelid: Secondary | ICD-10-CM | POA: Diagnosis not present

## 2016-02-18 DIAGNOSIS — H25813 Combined forms of age-related cataract, bilateral: Secondary | ICD-10-CM | POA: Diagnosis not present

## 2016-02-18 DIAGNOSIS — H02051 Trichiasis without entropian right upper eyelid: Secondary | ICD-10-CM | POA: Diagnosis not present

## 2016-02-25 DIAGNOSIS — M6281 Muscle weakness (generalized): Secondary | ICD-10-CM | POA: Diagnosis not present

## 2016-02-25 DIAGNOSIS — M542 Cervicalgia: Secondary | ICD-10-CM | POA: Diagnosis not present

## 2016-02-29 DIAGNOSIS — M6281 Muscle weakness (generalized): Secondary | ICD-10-CM | POA: Diagnosis not present

## 2016-02-29 DIAGNOSIS — M542 Cervicalgia: Secondary | ICD-10-CM | POA: Diagnosis not present

## 2016-03-16 ENCOUNTER — Emergency Department (HOSPITAL_COMMUNITY): Payer: No Typology Code available for payment source

## 2016-03-16 ENCOUNTER — Emergency Department (HOSPITAL_COMMUNITY)
Admission: EM | Admit: 2016-03-16 | Discharge: 2016-03-16 | Disposition: A | Discharge status: Home / Self Care | Payer: No Typology Code available for payment source | Attending: Emergency Medicine | Admitting: Emergency Medicine

## 2016-03-16 ENCOUNTER — Encounter (HOSPITAL_COMMUNITY): Payer: Self-pay | Admitting: Emergency Medicine

## 2016-03-16 DIAGNOSIS — Y92410 Unspecified street and highway as the place of occurrence of the external cause: Secondary | ICD-10-CM | POA: Insufficient documentation

## 2016-03-16 DIAGNOSIS — S90812A Abrasion, left foot, initial encounter: Secondary | ICD-10-CM | POA: Insufficient documentation

## 2016-03-16 DIAGNOSIS — S93402A Sprain of unspecified ligament of left ankle, initial encounter: Secondary | ICD-10-CM

## 2016-03-16 DIAGNOSIS — I10 Essential (primary) hypertension: Secondary | ICD-10-CM | POA: Insufficient documentation

## 2016-03-16 DIAGNOSIS — Z7982 Long term (current) use of aspirin: Secondary | ICD-10-CM | POA: Insufficient documentation

## 2016-03-16 DIAGNOSIS — S91012A Laceration without foreign body, left ankle, initial encounter: Secondary | ICD-10-CM | POA: Insufficient documentation

## 2016-03-16 DIAGNOSIS — Z23 Encounter for immunization: Secondary | ICD-10-CM | POA: Diagnosis not present

## 2016-03-16 DIAGNOSIS — S91312A Laceration without foreign body, left foot, initial encounter: Secondary | ICD-10-CM | POA: Diagnosis not present

## 2016-03-16 DIAGNOSIS — Z8669 Personal history of other diseases of the nervous system and sense organs: Secondary | ICD-10-CM | POA: Diagnosis not present

## 2016-03-16 DIAGNOSIS — Z79899 Other long term (current) drug therapy: Secondary | ICD-10-CM | POA: Diagnosis not present

## 2016-03-16 DIAGNOSIS — Y998 Other external cause status: Secondary | ICD-10-CM | POA: Diagnosis not present

## 2016-03-16 DIAGNOSIS — Y9389 Activity, other specified: Secondary | ICD-10-CM | POA: Insufficient documentation

## 2016-03-16 DIAGNOSIS — S99912A Unspecified injury of left ankle, initial encounter: Secondary | ICD-10-CM | POA: Diagnosis present

## 2016-03-16 DIAGNOSIS — M79672 Pain in left foot: Secondary | ICD-10-CM | POA: Diagnosis not present

## 2016-03-16 DIAGNOSIS — I2510 Atherosclerotic heart disease of native coronary artery without angina pectoris: Secondary | ICD-10-CM | POA: Diagnosis not present

## 2016-03-16 DIAGNOSIS — Z950 Presence of cardiac pacemaker: Secondary | ICD-10-CM | POA: Insufficient documentation

## 2016-03-16 DIAGNOSIS — M7989 Other specified soft tissue disorders: Secondary | ICD-10-CM | POA: Diagnosis not present

## 2016-03-16 DIAGNOSIS — IMO0002 Reserved for concepts with insufficient information to code with codable children: Secondary | ICD-10-CM

## 2016-03-16 MED ORDER — IBUPROFEN 400 MG PO TABS
400.0000 mg | ORAL_TABLET | Freq: Once | ORAL | Status: AC
  Administered 2016-03-16: 400 mg via ORAL
  Filled 2016-03-16: qty 1

## 2016-03-16 MED ORDER — TETANUS-DIPHTH-ACELL PERTUSSIS 5-2.5-18.5 LF-MCG/0.5 IM SUSP
0.5000 mL | Freq: Once | INTRAMUSCULAR | Status: AC
  Administered 2016-03-16: 0.5 mL via INTRAMUSCULAR
  Filled 2016-03-16: qty 0.5

## 2016-03-16 NOTE — Discharge Instructions (Signed)
Wear ankle brace for at least 2 weeks for stabilization of ankle. Ice and elevate ankle throughout the day. Alternate between tylenol and motrin/ibuprofen for pain relief. Keep the wound covered with topical antibiotic ointment and a bandage. Call orthopedic follow up today or tomorrow to schedule followup appointment for recheck of ongoing ankle pain in 1-2 weeks. Follow up with your regular doctor in 3-5 days for wound recheck and recheck of symptoms. Return to the ER for changes or worsening symptoms.    Ankle Sprain An ankle sprain is an injury to the strong, fibrous tissues (ligaments) that hold your ankle bones together.  HOME CARE   Put ice on your ankle for 1-2 days or as told by your doctor.  Put ice in a plastic bag.  Place a towel between your skin and the bag.  Leave the ice on for 15-20 minutes at a time, every 2 hours while you are awake.  Only take medicine as told by your doctor.  Raise (elevate) your injured ankle above the level of your heart as much as possible for 2-3 days.  Use crutches if your doctor tells you to. Slowly put your own weight on the affected ankle. Use the crutches until you can walk without pain.  If you have a plaster splint:  Do not rest it on anything harder than a pillow for 24 hours.  Do not put weight on it.  Do not get it wet.  Take it off to shower or bathe.  If given, use an elastic wrap or support stocking for support. Take the wrap off if your toes lose feeling (numb), tingle, or turn cold or blue.  If you have an air splint:  Add or let out air to make it comfortable.  Take it off at night and to shower and bathe.  Wiggle your toes and move your ankle up and down often while you are wearing it. GET HELP IF:  You have rapidly increasing bruising or puffiness (swelling).  Your toes feel very cold.  You lose feeling in your foot.  Your medicine does not help your pain. GET HELP RIGHT AWAY IF:   Your toes lose feeling  (numb) or turn blue.  You have severe pain that is increasing. MAKE SURE YOU:   Understand these instructions.  Will watch your condition.  Will get help right away if you are not doing well or get worse.   This information is not intended to replace advice given to you by your health care provider. Make sure you discuss any questions you have with your health care provider.   Document Released: 04/04/2008 Document Revised: 11/07/2014 Document Reviewed: 04/30/2012 Elsevier Interactive Patient Education 2016 Elsevier Inc[employer]  Abrasion An abrasion is a cut or scrape on the surface of your skin. An abrasion does not go through all of the layers of your skin. It is important to take good care of your abrasion to prevent infection. HOME CARE Medicines  Take or apply medicines only as told by your doctor.  If you were prescribed an antibiotic ointment, finish all of it even if you start to feel better. Wound Care  Clean the wound with mild soap and water 2-3 times per day or as told by your doctor. Pat your wound dry with a clean towel. Do not rub it.  There are many ways to close and cover a wound. Follow instructions from your doctor about:  How to take care of your wound.  When and how  you should change your bandage (dressing).  When and how you should take off your dressing.  Check your wound every day for signs of infection. Watch for:  Redness, swelling, or pain.  Fluid, blood, or pus. General Instructions  Keep the dressing dry as told by your doctor. Do not take baths, swim, use a hot tub, or do anything that would put your wound underwater until your doctor says it is okay.  If there is swelling, raise (elevate) the injured area above the level of your heart while you are sitting or lying down.  Keep all follow-up visits as told by your doctor. This is important. GET HELP IF:  You were given a tetanus shot and you have any of these where the needle went  in:  Swelling.  Very bad pain.  Redness.  Bleeding.  Medicine does not help your pain.  You have any of these at the site of the wound:  More redness.  More swelling.  More pain. GET HELP RIGHT AWAY IF:  You have a red streak going away from your wound.  You have a fever.  You have fluid, blood, or pus coming from your wound.  There is a bad smell coming from your wound.   This information is not intended to replace advice given to you by your health care provider. Make sure you discuss any questions you have with your health care provider.   Document Released: 04/04/2008 Document Revised: 03/03/2015 Document Reviewed: 10/15/2014 Elsevier Interactive Patient Education 2016 Elsevier Inc.  Cryotherapy Cryotherapy is when you put ice on your injury. Ice helps lessen pain and puffiness (swelling) after an injury. Ice works the best when you start using it in the first 24 to 48 hours after an injury. HOME CARE  Put a dry or damp towel between the ice pack and your skin.  You may press gently on the ice pack.  Leave the ice on for no more than 10 to 20 minutes at a time.  Check your skin after 5 minutes to make sure your skin is okay.  Rest at least 20 minutes between ice pack uses.  Stop using ice when your skin loses feeling (numbness).  Do not use ice on someone who cannot tell you when it hurts. This includes small children and people with memory problems (dementia). GET HELP RIGHT AWAY IF:  You have white spots on your skin.  Your skin turns blue or pale.  Your skin feels waxy or hard.  Your puffiness gets worse. MAKE SURE YOU:   Understand these instructions.  Will watch your condition.  Will get help right away if you are not doing well or get worse.   This information is not intended to replace advice given to you by your health care provider. Make sure you discuss any questions you have with your health care provider.   Document Released:  04/04/2008 Document Revised: 01/09/2012 Document Reviewed: 06/09/2011 Elsevier Interactive Patient Education 2016 [place] Taking care of your wound properly can help to prevent pain and infection. It can also help your wound to heal more quickly.  HOW TO CARE FOR YOUR WOUND  Take or apply over-the-counter and prescription medicines only as told by your health care provider.  If you were prescribed antibiotic medicine, take or apply it as told by your health care provider. Do not stop using the antibiotic even if your condition improves.  Clean the wound each day or as told by your  health care provider.  Wash the wound with mild soap and water.  Rinse the wound with water to remove all soap.  Pat the wound dry with a clean towel. Do not rub it.  There are many different ways to close and cover a wound. For example, a wound can be covered with stitches (sutures), skin glue, or adhesive strips. Follow instructions from your health care provider about:  How to take care of your wound.  When and how you should change your bandage (dressing).  When you should remove your dressing.  Removing whatever was used to close your wound.  Check your wound every day for signs of infection. Watch for:  Redness, swelling, or pain.  Fluid, blood, or pus.  Keep the dressing dry until your health care provider says it can be removed. Do not take baths, swim, use a hot tub, or do anything that would put your wound underwater until your health care provider approves.  Raise (elevate) the injured area above the level of your heart while you are sitting or lying down.  Do not scratch or pick at the wound.  Keep all follow-up visits as told by your health care provider. This is important. SEEK MEDICAL CARE IF:  You received a tetanus shot and you have swelling, severe pain, redness, or bleeding at the injection site.  You have a fever.  Your pain is not controlled with  medicine.  You have increased redness, swelling, or pain at the site of your wound.  You have fluid, blood, or pus coming from your wound.  You notice a bad smell coming from your wound or your dressing. SEEK IMMEDIATE MEDICAL CARE IF:  You have a red streak going away from your wound.   This information is not intended to replace advice given to you by your health care provider. Make sure you discuss any questions you have with your health care provider.   Document Released: 07/26/2008 Document Revised: 03/03/2015 Document Reviewed: 10/13/2014 Elsevier Interactive Patient Education [employer].
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
## 2016-03-21 DIAGNOSIS — S93402S Sprain of unspecified ligament of left ankle, sequela: Secondary | ICD-10-CM | POA: Diagnosis not present

## 2016-03-22 DIAGNOSIS — M6281 Muscle weakness (generalized): Secondary | ICD-10-CM | POA: Diagnosis not present

## 2016-03-22 DIAGNOSIS — M542 Cervicalgia: Secondary | ICD-10-CM | POA: Diagnosis not present

## 2016-03-24 DIAGNOSIS — M6281 Muscle weakness (generalized): Secondary | ICD-10-CM | POA: Diagnosis not present

## 2016-03-24 DIAGNOSIS — M542 Cervicalgia: Secondary | ICD-10-CM | POA: Diagnosis not present

## 2016-03-29 DIAGNOSIS — M542 Cervicalgia: Secondary | ICD-10-CM | POA: Diagnosis not present

## 2016-03-29 DIAGNOSIS — M6281 Muscle weakness (generalized): Secondary | ICD-10-CM | POA: Diagnosis not present

## 2016-03-31 DIAGNOSIS — M542 Cervicalgia: Secondary | ICD-10-CM | POA: Diagnosis not present

## 2016-03-31 DIAGNOSIS — M6281 Muscle weakness (generalized): Secondary | ICD-10-CM | POA: Diagnosis not present

## 2016-04-01 DIAGNOSIS — S93402S Sprain of unspecified ligament of left ankle, sequela: Secondary | ICD-10-CM | POA: Diagnosis not present

## 2016-04-05 DIAGNOSIS — M6281 Muscle weakness (generalized): Secondary | ICD-10-CM | POA: Diagnosis not present

## 2016-04-05 DIAGNOSIS — M542 Cervicalgia: Secondary | ICD-10-CM | POA: Diagnosis not present

## 2016-04-07 DIAGNOSIS — M542 Cervicalgia: Secondary | ICD-10-CM | POA: Diagnosis not present

## 2016-04-07 DIAGNOSIS — M6281 Muscle weakness (generalized): Secondary | ICD-10-CM | POA: Diagnosis not present

## 2016-04-12 ENCOUNTER — Encounter: Payer: Self-pay | Admitting: Cardiovascular Disease

## 2016-04-12 ENCOUNTER — Ambulatory Visit (INDEPENDENT_AMBULATORY_CARE_PROVIDER_SITE_OTHER): Payer: Medicare Other | Admitting: Cardiovascular Disease

## 2016-04-12 VITALS — BP 124/80 | HR 68 | Ht 69.0 in | Wt 158.0 lb

## 2016-04-12 DIAGNOSIS — M6281 Muscle weakness (generalized): Secondary | ICD-10-CM | POA: Diagnosis not present

## 2016-04-12 DIAGNOSIS — I495 Sick sinus syndrome: Secondary | ICD-10-CM

## 2016-04-12 DIAGNOSIS — Z950 Presence of cardiac pacemaker: Secondary | ICD-10-CM | POA: Diagnosis not present

## 2016-04-12 DIAGNOSIS — M542 Cervicalgia: Secondary | ICD-10-CM | POA: Diagnosis not present

## 2016-04-12 LAB — CUP PACEART INCLINIC DEVICE CHECK
Battery Voltage: 2.96 V
Brady Statistic RA Percent Paced: 98.8 %
Brady Statistic RV Percent Paced: 8.76 %
Date Time Interrogation Session: 20170613124423
Implantable Lead Implant Date: 20110429
Implantable Lead Location: 753860
Implantable Lead Model: 5086
Implantable Lead Model: 5086
Lead Channel Impedance Value: 316 Ohm
Lead Channel Setting Pacing Amplitude: 2 V
Lead Channel Setting Pacing Pulse Width: 0.8 ms
Lead Channel Setting Sensing Sensitivity: 1.2 mV
MDC IDC LEAD IMPLANT DT: 20110429
MDC IDC LEAD LOCATION: 753859
MDC IDC MSMT LEADCHNL RA IMPEDANCE VALUE: 496 Ohm
MDC IDC MSMT LEADCHNL RA SENSING INTR AMPL: 2.468 mV
MDC IDC MSMT LEADCHNL RV SENSING INTR AMPL: 3.03 mV
MDC IDC SET LEADCHNL RV PACING AMPLITUDE: 3 V
MDC IDC STAT BRADY AP VP PERCENT: 8.52 %
MDC IDC STAT BRADY AP VS PERCENT: 90.28 %
MDC IDC STAT BRADY AS VP PERCENT: 0.24 %
MDC IDC STAT BRADY AS VS PERCENT: 0.97 %

## 2016-04-12 NOTE — Patient Instructions (Signed)
Dr Sallyanne Kuster recommends that you continue on your current medications as directed. Please refer to the Current Medication list given to you today.  Remote monitoring is used to monitor your Pacemaker of ICD from home. This monitoring reduces the number of office visits required to check your device to one time per year. It allows [place] to keep an eye on the functioning of your device to ensure it is working properly. You are scheduled for a device check from home on Tuesday, September 12th, 2017. You may send your transmission at any time that day. If you have a wireless device, the transmission will be sent automatically. After your physician reviews your transmission, you will receive a postcard with your next transmission date.  Dr Sallyanne Kuster recommends that you schedule a follow-up appointment in 12 months with a pacemaker check. You will receive a reminder letter in the mail two months in advance. If you don't receive a letter, please call our office to schedule the follow-up appointment.  If you need a refill on your cardiac medications before your next appointment, please call your pharmacy.
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
## 2016-04-14 ENCOUNTER — Encounter: Payer: Self-pay | Admitting: Cardiovascular Disease

## 2016-04-14 DIAGNOSIS — M542 Cervicalgia: Secondary | ICD-10-CM | POA: Diagnosis not present

## 2016-04-14 DIAGNOSIS — M6281 Muscle weakness (generalized): Secondary | ICD-10-CM | POA: Diagnosis not present

## 2016-04-19 ENCOUNTER — Other Ambulatory Visit: Payer: Self-pay | Admitting: *Deleted

## 2016-04-19 ENCOUNTER — Other Ambulatory Visit: Payer: Self-pay | Admitting: Cardiovascular Disease

## 2016-04-19 MED ORDER — METOPROLOL TARTRATE 25 MG PO TABS: 25.0000 mg | ORAL_TABLET | Freq: Two times a day (BID) | ORAL | Status: DC

## 2016-04-20 ENCOUNTER — Other Ambulatory Visit: Payer: Self-pay | Admitting: Cardiovascular Disease

## 2016-04-28 DIAGNOSIS — M542 Cervicalgia: Secondary | ICD-10-CM | POA: Diagnosis not present

## 2016-04-28 DIAGNOSIS — M6281 Muscle weakness (generalized): Secondary | ICD-10-CM | POA: Diagnosis not present

## 2016-04-29 DIAGNOSIS — M542 Cervicalgia: Secondary | ICD-10-CM | POA: Diagnosis not present

## 2016-04-29 DIAGNOSIS — R001 Bradycardia, unspecified: Secondary | ICD-10-CM | POA: Diagnosis not present

## 2016-04-29 DIAGNOSIS — Z950 Presence of cardiac pacemaker: Secondary | ICD-10-CM | POA: Diagnosis not present

## 2016-04-29 DIAGNOSIS — S93402S Sprain of unspecified ligament of left ankle, sequela: Secondary | ICD-10-CM | POA: Diagnosis not present

## 2016-05-04 ENCOUNTER — Other Ambulatory Visit: Payer: Self-pay

## 2016-05-04 MED ORDER — DILTIAZEM HCL ER COATED BEADS 120 MG PO CP24: 120.0000 mg | ORAL_CAPSULE | Freq: Every day | ORAL | Status: DC

## 2016-07-12 ENCOUNTER — Telehealth: Payer: Self-pay | Admitting: Cardiology

## 2016-07-12 ENCOUNTER — Ambulatory Visit (INDEPENDENT_AMBULATORY_CARE_PROVIDER_SITE_OTHER): Payer: Medicare Other | Admitting: *Deleted

## 2016-07-12 DIAGNOSIS — I495 Sick sinus syndrome: Secondary | ICD-10-CM

## 2016-07-12 NOTE — Telephone Encounter (Signed)
Spoke with pt and reminded pt of remote transmission that is due today. Pt verbalized understanding.   

## 2016-07-13 NOTE — Progress Notes (Signed)
Remote pacemaker transmission.   

## 2016-07-14 ENCOUNTER — Encounter: Payer: Self-pay | Admitting: Cardiology

## 2016-07-21 LAB — CUP PACEART REMOTE DEVICE CHECK
Battery Voltage: 2.95 V
Brady Statistic RA Percent Paced: 98.97 %
Implantable Lead Implant Date: 20110429
Implantable Lead Implant Date: 20110429
Implantable Lead Location: 753859
Implantable Lead Location: 753860
Lead Channel Impedance Value: 324 Ohm
Lead Channel Sensing Intrinsic Amplitude: 2.02 mV
Lead Channel Setting Sensing Sensitivity: 1.2 mV
MDC IDC LEAD MODEL: 5086
MDC IDC LEAD MODEL: 5086
MDC IDC MSMT LEADCHNL RA IMPEDANCE VALUE: 512 Ohm
MDC IDC MSMT LEADCHNL RA SENSING INTR AMPL: 2.165 mV
MDC IDC SESS DTM: 20170912191722
MDC IDC SET LEADCHNL RA PACING AMPLITUDE: 2 V
MDC IDC SET LEADCHNL RV PACING AMPLITUDE: 3 V
MDC IDC SET LEADCHNL RV PACING PULSEWIDTH: 0.8 ms
MDC IDC STAT BRADY AP VP PERCENT: 2.15 %
MDC IDC STAT BRADY AP VS PERCENT: 96.82 %
MDC IDC STAT BRADY AS VP PERCENT: 0.12 %
MDC IDC STAT BRADY AS VS PERCENT: 0.91 %
MDC IDC STAT BRADY RV PERCENT PACED: 2.28 %

## 2016-07-26 DIAGNOSIS — Z23 Encounter for immunization: Secondary | ICD-10-CM | POA: Diagnosis not present

## 2016-07-28 ENCOUNTER — Encounter: Payer: Self-pay | Admitting: Cardiology

## 2016-08-18 DIAGNOSIS — M47812 Spondylosis without myelopathy or radiculopathy, cervical region: Secondary | ICD-10-CM | POA: Diagnosis not present

## 2016-08-18 DIAGNOSIS — R001 Bradycardia, unspecified: Secondary | ICD-10-CM | POA: Diagnosis not present

## 2016-08-18 DIAGNOSIS — Z6822 Body mass index (BMI) 22.0-22.9, adult: Secondary | ICD-10-CM | POA: Diagnosis not present

## 2016-08-18 DIAGNOSIS — S93402S Sprain of unspecified ligament of left ankle, sequela: Secondary | ICD-10-CM | POA: Diagnosis not present

## 2016-08-18 DIAGNOSIS — Z23 Encounter for immunization: Secondary | ICD-10-CM | POA: Diagnosis not present

## 2016-09-08 DIAGNOSIS — H25813 Combined forms of age-related cataract, bilateral: Secondary | ICD-10-CM | POA: Diagnosis not present

## 2016-09-08 DIAGNOSIS — H02051 Trichiasis without entropian right upper eyelid: Secondary | ICD-10-CM | POA: Diagnosis not present

## 2016-09-08 DIAGNOSIS — H0289 Other specified disorders of eyelid: Secondary | ICD-10-CM | POA: Diagnosis not present

## 2016-10-11 ENCOUNTER — Ambulatory Visit (INDEPENDENT_AMBULATORY_CARE_PROVIDER_SITE_OTHER): Payer: Medicare Other | Admitting: *Deleted

## 2016-10-11 DIAGNOSIS — I495 Sick sinus syndrome: Secondary | ICD-10-CM

## 2016-10-11 NOTE — Progress Notes (Signed)
Remote pacemaker transmission.   

## 2016-10-19 ENCOUNTER — Encounter: Payer: Self-pay | Admitting: Cardiology

## 2016-10-26 LAB — CUP PACEART REMOTE DEVICE CHECK
Brady Statistic AP VS Percent: 96.09 %
Brady Statistic AS VP Percent: 0.28 %
Brady Statistic RV Percent Paced: 2.62 %
Date Time Interrogation Session: 20171212211151
Implantable Lead Location: 753859
Implantable Lead Model: 5086
Lead Channel Impedance Value: 528 Ohm
Lead Channel Setting Pacing Amplitude: 2 V
Lead Channel Setting Pacing Amplitude: 3 V
MDC IDC LEAD IMPLANT DT: 20110429
MDC IDC LEAD IMPLANT DT: 20110429
MDC IDC LEAD LOCATION: 753860
MDC IDC LEAD MODEL: 5086
MDC IDC MSMT BATTERY VOLTAGE: 2.95 V
MDC IDC MSMT LEADCHNL RA SENSING INTR AMPL: 2.728 mV
MDC IDC MSMT LEADCHNL RV IMPEDANCE VALUE: 320 Ohm
MDC IDC MSMT LEADCHNL RV SENSING INTR AMPL: 2.693 mV
MDC IDC PG IMPLANT DT: 20110429
MDC IDC SET LEADCHNL RV PACING PULSEWIDTH: 0.8 ms
MDC IDC SET LEADCHNL RV SENSING SENSITIVITY: 1.2 mV
MDC IDC STAT BRADY AP VP PERCENT: 2.22 %
MDC IDC STAT BRADY AS VS PERCENT: 1.41 %
MDC IDC STAT BRADY RA PERCENT PACED: 97.47 %

## 2016-11-04 ENCOUNTER — Encounter: Payer: Self-pay | Admitting: Cardiology

## 2016-12-14 DIAGNOSIS — Z136 Encounter for screening for cardiovascular disorders: Secondary | ICD-10-CM | POA: Diagnosis not present

## 2016-12-14 DIAGNOSIS — I10 Essential (primary) hypertension: Secondary | ICD-10-CM | POA: Diagnosis not present

## 2016-12-14 DIAGNOSIS — Z79899 Other long term (current) drug therapy: Secondary | ICD-10-CM | POA: Diagnosis not present

## 2016-12-16 DIAGNOSIS — R000 Tachycardia, unspecified: Secondary | ICD-10-CM | POA: Diagnosis not present

## 2016-12-16 DIAGNOSIS — N400 Enlarged prostate without lower urinary tract symptoms: Secondary | ICD-10-CM | POA: Diagnosis not present

## 2016-12-16 DIAGNOSIS — R001 Bradycardia, unspecified: Secondary | ICD-10-CM | POA: Diagnosis not present

## 2016-12-16 DIAGNOSIS — D696 Thrombocytopenia, unspecified: Secondary | ICD-10-CM | POA: Diagnosis not present

## 2017-01-10 ENCOUNTER — Ambulatory Visit (INDEPENDENT_AMBULATORY_CARE_PROVIDER_SITE_OTHER): Payer: Medicare Other | Admitting: *Deleted

## 2017-01-10 DIAGNOSIS — I495 Sick sinus syndrome: Secondary | ICD-10-CM

## 2017-01-10 NOTE — Progress Notes (Signed)
Remote pacemaker transmission.   

## 2017-01-11 ENCOUNTER — Encounter: Payer: Self-pay | Admitting: Cardiology

## 2017-01-11 LAB — CUP PACEART REMOTE DEVICE CHECK
Brady Statistic AP VP Percent: 3.93 %
Brady Statistic AP VS Percent: 94.98 %
Brady Statistic AS VP Percent: 0.13 %
Implantable Lead Location: 753859
Lead Channel Impedance Value: 332 Ohm
Lead Channel Impedance Value: 512 Ohm
Lead Channel Sensing Intrinsic Amplitude: 2.693 mV
Lead Channel Setting Pacing Amplitude: 3 V
Lead Channel Setting Pacing Pulse Width: 0.8 ms
MDC IDC LEAD IMPLANT DT: 20110429
MDC IDC LEAD IMPLANT DT: 20110429
MDC IDC LEAD LOCATION: 753860
MDC IDC MSMT BATTERY VOLTAGE: 2.93 V
MDC IDC MSMT LEADCHNL RA SENSING INTR AMPL: 2.468 mV
MDC IDC PG IMPLANT DT: 20110429
MDC IDC SESS DTM: 20180313162733
MDC IDC SET LEADCHNL RA PACING AMPLITUDE: 2 V
MDC IDC SET LEADCHNL RV SENSING SENSITIVITY: 1.2 mV
MDC IDC STAT BRADY AS VS PERCENT: 0.96 %
MDC IDC STAT BRADY RA PERCENT PACED: 98.52 %
MDC IDC STAT BRADY RV PERCENT PACED: 4.21 %

## 2017-02-16 DIAGNOSIS — N400 Enlarged prostate without lower urinary tract symptoms: Secondary | ICD-10-CM | POA: Diagnosis not present

## 2017-02-16 DIAGNOSIS — R000 Tachycardia, unspecified: Secondary | ICD-10-CM | POA: Diagnosis not present

## 2017-02-16 DIAGNOSIS — Z6822 Body mass index (BMI) 22.0-22.9, adult: Secondary | ICD-10-CM | POA: Diagnosis not present

## 2017-02-16 DIAGNOSIS — L814 Other melanin hyperpigmentation: Secondary | ICD-10-CM | POA: Diagnosis not present

## 2017-03-01 ENCOUNTER — Other Ambulatory Visit: Payer: Self-pay | Admitting: Cardiovascular Disease

## 2017-03-01 NOTE — Telephone Encounter (Signed)
New message     *STAT* If patient is at the pharmacy, call can be transferred to refill team.   1. Which medications need to be refilled? (please list name of each medication and dose if known) metoprolol 25 mg   2. Which pharmacy/location (including street and city if local pharmacy) is medication to be sent to?[facility] on [address]   3. Do they need a 30 day or 90 day supply? 30 day

## 2017-03-07 DIAGNOSIS — H02051 Trichiasis without entropian right upper eyelid: Secondary | ICD-10-CM | POA: Diagnosis not present

## 2017-03-07 DIAGNOSIS — H43813 Vitreous degeneration, bilateral: Secondary | ICD-10-CM | POA: Diagnosis not present

## 2017-03-07 DIAGNOSIS — H25813 Combined forms of age-related cataract, bilateral: Secondary | ICD-10-CM | POA: Diagnosis not present

## 2017-04-15 ENCOUNTER — Other Ambulatory Visit: Payer: Self-pay | Admitting: Cardiovascular Disease

## 2017-04-17 DIAGNOSIS — H6121 Impacted cerumen, right ear: Secondary | ICD-10-CM | POA: Diagnosis not present

## 2017-04-17 DIAGNOSIS — R000 Tachycardia, unspecified: Secondary | ICD-10-CM | POA: Diagnosis not present

## 2017-04-17 DIAGNOSIS — I959 Hypotension, unspecified: Secondary | ICD-10-CM | POA: Diagnosis not present

## 2017-04-17 DIAGNOSIS — N400 Enlarged prostate without lower urinary tract symptoms: Secondary | ICD-10-CM | POA: Diagnosis not present

## 2017-04-17 DIAGNOSIS — I10 Essential (primary) hypertension: Secondary | ICD-10-CM | POA: Diagnosis not present

## 2017-04-17 DIAGNOSIS — Z125 Encounter for screening for malignant neoplasm of prostate: Secondary | ICD-10-CM | POA: Diagnosis not present

## 2017-04-17 DIAGNOSIS — R001 Bradycardia, unspecified: Secondary | ICD-10-CM | POA: Diagnosis not present

## 2017-04-17 DIAGNOSIS — Z79899 Other long term (current) drug therapy: Secondary | ICD-10-CM | POA: Diagnosis not present

## 2017-04-17 DIAGNOSIS — Z0000 Encounter for general adult medical examination without abnormal findings: Secondary | ICD-10-CM | POA: Diagnosis not present

## 2017-04-28 ENCOUNTER — Other Ambulatory Visit: Payer: Self-pay | Admitting: Cardiovascular Disease

## 2017-04-28 NOTE — Telephone Encounter (Signed)
Rx(s) sent to pharmacy electronically.  

## 2017-05-10 ENCOUNTER — Ambulatory Visit (INDEPENDENT_AMBULATORY_CARE_PROVIDER_SITE_OTHER): Payer: Medicare Other | Admitting: Cardiovascular Disease

## 2017-05-10 ENCOUNTER — Encounter: Payer: Self-pay | Admitting: Cardiovascular Disease

## 2017-05-10 VITALS — BP 122/64 | HR 61 | Ht 69.0 in | Wt 151.8 lb

## 2017-05-10 DIAGNOSIS — I495 Sick sinus syndrome: Secondary | ICD-10-CM

## 2017-05-10 DIAGNOSIS — I4510 Unspecified right bundle-branch block: Secondary | ICD-10-CM | POA: Diagnosis not present

## 2017-05-10 DIAGNOSIS — I471 Supraventricular tachycardia: Secondary | ICD-10-CM

## 2017-05-10 DIAGNOSIS — Z950 Presence of cardiac pacemaker: Secondary | ICD-10-CM

## 2017-05-10 NOTE — Patient Instructions (Signed)
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
## 2017-05-12 DIAGNOSIS — I471 Supraventricular tachycardia: Secondary | ICD-10-CM | POA: Insufficient documentation

## 2017-05-12 DIAGNOSIS — F4321 Adjustment disorder with depressed mood: Secondary | ICD-10-CM | POA: Diagnosis not present

## 2017-05-12 DIAGNOSIS — I4719 Other supraventricular tachycardia: Secondary | ICD-10-CM | POA: Insufficient documentation

## 2017-05-17 ENCOUNTER — Other Ambulatory Visit: Payer: Self-pay | Admitting: Cardiovascular Disease

## 2017-05-31 DIAGNOSIS — F4321 Adjustment disorder with depressed mood: Secondary | ICD-10-CM | POA: Diagnosis not present

## 2017-06-12 DIAGNOSIS — F4321 Adjustment disorder with depressed mood: Secondary | ICD-10-CM | POA: Diagnosis not present

## 2017-06-19 ENCOUNTER — Other Ambulatory Visit: Payer: Self-pay | Admitting: Cardiovascular Disease

## 2017-06-20 LAB — CUP PACEART INCLINIC DEVICE CHECK
Brady Statistic AP VP Percent: 3.5 %
Brady Statistic AP VS Percent: 95 %
Brady Statistic AS VP Percent: 0.3 %
Date Time Interrogation Session: 20180821130913
Implantable Lead Implant Date: 20110429
Implantable Lead Location: 753859
Implantable Lead Location: 753860
Implantable Pulse Generator Implant Date: 20110429
Lead Channel Impedance Value: 316 Ohm
Lead Channel Impedance Value: 496 Ohm
Lead Channel Pacing Threshold Amplitude: 1.5 V
Lead Channel Pacing Threshold Pulse Width: 0.4 ms
Lead Channel Pacing Threshold Pulse Width: 0.8 ms
Lead Channel Sensing Intrinsic Amplitude: 2.4 mV
Lead Channel Sensing Intrinsic Amplitude: 2.6 mV
Lead Channel Setting Pacing Amplitude: 2 V
Lead Channel Setting Pacing Amplitude: 3 V
Lead Channel Setting Sensing Sensitivity: 1.2 mV
MDC IDC LEAD IMPLANT DT: 20110429
MDC IDC MSMT BATTERY VOLTAGE: 2.92 V
MDC IDC MSMT LEADCHNL RA PACING THRESHOLD AMPLITUDE: 1 V
MDC IDC SET LEADCHNL RV PACING PULSEWIDTH: 0.8 ms
MDC IDC STAT BRADY AS VS PERCENT: 1.2 %

## 2017-06-22 DIAGNOSIS — F4321 Adjustment disorder with depressed mood: Secondary | ICD-10-CM | POA: Diagnosis not present

## 2017-07-04 DIAGNOSIS — R001 Bradycardia, unspecified: Secondary | ICD-10-CM | POA: Diagnosis not present

## 2017-07-04 DIAGNOSIS — E782 Mixed hyperlipidemia: Secondary | ICD-10-CM | POA: Diagnosis not present

## 2017-07-04 DIAGNOSIS — Z79899 Other long term (current) drug therapy: Secondary | ICD-10-CM | POA: Diagnosis not present

## 2017-07-04 DIAGNOSIS — E559 Vitamin D deficiency, unspecified: Secondary | ICD-10-CM | POA: Diagnosis not present

## 2017-07-20 DIAGNOSIS — Z23 Encounter for immunization: Secondary | ICD-10-CM | POA: Diagnosis not present

## 2017-07-20 DIAGNOSIS — R350 Frequency of micturition: Secondary | ICD-10-CM | POA: Diagnosis not present

## 2017-07-20 DIAGNOSIS — H6121 Impacted cerumen, right ear: Secondary | ICD-10-CM | POA: Diagnosis not present

## 2017-07-20 DIAGNOSIS — E782 Mixed hyperlipidemia: Secondary | ICD-10-CM | POA: Diagnosis not present

## 2017-07-20 DIAGNOSIS — E559 Vitamin D deficiency, unspecified: Secondary | ICD-10-CM | POA: Diagnosis not present

## 2017-07-24 DIAGNOSIS — F4321 Adjustment disorder with depressed mood: Secondary | ICD-10-CM | POA: Diagnosis not present

## 2017-08-01 ENCOUNTER — Other Ambulatory Visit: Payer: Self-pay | Admitting: Cardiovascular Disease

## 2017-08-09 ENCOUNTER — Ambulatory Visit (INDEPENDENT_AMBULATORY_CARE_PROVIDER_SITE_OTHER): Payer: Medicare Other | Admitting: *Deleted

## 2017-08-09 DIAGNOSIS — I495 Sick sinus syndrome: Secondary | ICD-10-CM

## 2017-08-09 NOTE — Progress Notes (Signed)
Remote pacemaker transmission.   

## 2017-08-10 LAB — CUP PACEART REMOTE DEVICE CHECK
Brady Statistic AP VP Percent: 3.06 %
Brady Statistic AP VS Percent: 95.97 %
Brady Statistic AS VP Percent: 0.01 %
Brady Statistic AS VS Percent: 0.96 %
Brady Statistic RV Percent Paced: 3.14 %
Implantable Lead Implant Date: 20110429
Implantable Lead Location: 753859
Implantable Lead Location: 753860
Lead Channel Impedance Value: 320 Ohm
Lead Channel Impedance Value: 552 Ohm
Lead Channel Sensing Intrinsic Amplitude: 1.818 mV
Lead Channel Setting Pacing Amplitude: 2 V
Lead Channel Setting Pacing Amplitude: 3 V
Lead Channel Setting Pacing Pulse Width: 0.8 ms
MDC IDC LEAD IMPLANT DT: 20110429
MDC IDC MSMT BATTERY VOLTAGE: 2.92 V
MDC IDC MSMT LEADCHNL RV SENSING INTR AMPL: 2.693 mV
MDC IDC PG IMPLANT DT: 20110429
MDC IDC SESS DTM: 20181010145649
MDC IDC SET LEADCHNL RV SENSING SENSITIVITY: 1.2 mV
MDC IDC STAT BRADY RA PERCENT PACED: 99.02 %

## 2017-08-11 ENCOUNTER — Encounter: Payer: Self-pay | Admitting: Cardiology

## 2017-08-17 DIAGNOSIS — F4321 Adjustment disorder with depressed mood: Secondary | ICD-10-CM | POA: Diagnosis not present

## 2017-11-08 ENCOUNTER — Ambulatory Visit (INDEPENDENT_AMBULATORY_CARE_PROVIDER_SITE_OTHER): Payer: Medicare Other | Admitting: *Deleted

## 2017-11-08 DIAGNOSIS — I495 Sick sinus syndrome: Secondary | ICD-10-CM | POA: Diagnosis not present

## 2017-11-08 NOTE — Progress Notes (Signed)
Remote pacemaker transmission.   

## 2017-11-09 ENCOUNTER — Encounter: Payer: Self-pay | Admitting: Cardiology

## 2017-11-09 LAB — CUP PACEART REMOTE DEVICE CHECK
Battery Voltage: 2.9 V
Brady Statistic AS VS Percent: 0.31 %
Implantable Lead Implant Date: 20110429
Implantable Lead Location: 753860
Lead Channel Impedance Value: 324 Ohm
Lead Channel Sensing Intrinsic Amplitude: 2.122 mV
Lead Channel Setting Sensing Sensitivity: 1.2 mV
MDC IDC LEAD IMPLANT DT: 20110429
MDC IDC LEAD LOCATION: 753859
MDC IDC MSMT LEADCHNL RA IMPEDANCE VALUE: 440 Ohm
MDC IDC MSMT LEADCHNL RV SENSING INTR AMPL: 2.02 mV
MDC IDC PG IMPLANT DT: 20110429
MDC IDC SESS DTM: 20190109171208
MDC IDC SET LEADCHNL RA PACING AMPLITUDE: 2 V
MDC IDC SET LEADCHNL RV PACING AMPLITUDE: 3 V
MDC IDC SET LEADCHNL RV PACING PULSEWIDTH: 0.8 ms
MDC IDC STAT BRADY AP VP PERCENT: 7.65 %
MDC IDC STAT BRADY AP VS PERCENT: 91.88 %
MDC IDC STAT BRADY AS VP PERCENT: 0.16 %
MDC IDC STAT BRADY RA PERCENT PACED: 99.5 %
MDC IDC STAT BRADY RV PERCENT PACED: 7.91 %

## 2017-12-04 ENCOUNTER — Telehealth: Payer: Self-pay | Admitting: Cardiovascular Disease

## 2017-12-04 NOTE — Telephone Encounter (Signed)
800# given. Patient verbalized understanding.

## 2017-12-04 NOTE — Telephone Encounter (Signed)
Patient calling,     1. Has your device fired? no  2. Is you device beeping?no  3. Are you experiencing draining or swelling at device site? no  4. Are you calling to see if we received your device transmission? no  5. Have you passed out? No  Patient has Pacemaker and is considering buying a home near [place]. Patient wanted to make sure that it would be okay with his pacemaker    Please route to [place] e

## 2017-12-29 ENCOUNTER — Encounter (HOSPITAL_COMMUNITY): Payer: Self-pay

## 2017-12-29 ENCOUNTER — Emergency Department (HOSPITAL_COMMUNITY)
Admission: EM | Admit: 2017-12-29 | Discharge: 2017-12-30 | Disposition: A | Discharge status: Home / Self Care | Payer: Medicare Other | Attending: Emergency Medicine | Admitting: Emergency Medicine

## 2017-12-29 ENCOUNTER — Other Ambulatory Visit: Payer: Self-pay

## 2017-12-29 ENCOUNTER — Emergency Department (HOSPITAL_COMMUNITY): Payer: Medicare Other

## 2017-12-29 DIAGNOSIS — I2510 Atherosclerotic heart disease of native coronary artery without angina pectoris: Secondary | ICD-10-CM | POA: Diagnosis not present

## 2017-12-29 DIAGNOSIS — R55 Syncope and collapse: Secondary | ICD-10-CM | POA: Diagnosis not present

## 2017-12-29 DIAGNOSIS — Z950 Presence of cardiac pacemaker: Secondary | ICD-10-CM | POA: Diagnosis not present

## 2017-12-29 DIAGNOSIS — S82841A Displaced bimalleolar fracture of right lower leg, initial encounter for closed fracture: Secondary | ICD-10-CM | POA: Diagnosis not present

## 2017-12-29 DIAGNOSIS — I959 Hypotension, unspecified: Secondary | ICD-10-CM | POA: Diagnosis not present

## 2017-12-29 DIAGNOSIS — Z7982 Long term (current) use of aspirin: Secondary | ICD-10-CM | POA: Diagnosis not present

## 2017-12-29 DIAGNOSIS — S82831A Other fracture of upper and lower end of right fibula, initial encounter for closed fracture: Secondary | ICD-10-CM | POA: Diagnosis not present

## 2017-12-29 DIAGNOSIS — R110 Nausea: Secondary | ICD-10-CM | POA: Diagnosis not present

## 2017-12-29 DIAGNOSIS — Z79899 Other long term (current) drug therapy: Secondary | ICD-10-CM | POA: Insufficient documentation

## 2017-12-29 DIAGNOSIS — I479 Paroxysmal tachycardia, unspecified: Secondary | ICD-10-CM | POA: Insufficient documentation

## 2017-12-29 DIAGNOSIS — M25561 Pain in right knee: Secondary | ICD-10-CM | POA: Diagnosis not present

## 2017-12-29 DIAGNOSIS — I10 Essential (primary) hypertension: Secondary | ICD-10-CM | POA: Insufficient documentation

## 2017-12-29 DIAGNOSIS — S82431A Displaced oblique fracture of shaft of right fibula, initial encounter for closed fracture: Secondary | ICD-10-CM | POA: Diagnosis not present

## 2017-12-29 DIAGNOSIS — R51 Headache: Secondary | ICD-10-CM | POA: Diagnosis not present

## 2017-12-29 LAB — BASIC METABOLIC PANEL
ANION GAP: 9 (ref 5–15)
BUN: 19 mg/dL (ref 6–20)
CHLORIDE: 101 mmol/L (ref 101–111)
CO2: 27 mmol/L (ref 22–32)
Calcium: 9.2 mg/dL (ref 8.9–10.3)
Creatinine, Ser: 1.24 mg/dL (ref 0.61–1.24)
GFR calc non Af Amer: 52 mL/min — ABNORMAL LOW (ref >60)
Glucose, Bld: 120 mg/dL — ABNORMAL HIGH (ref 65–99)
Potassium: 3.9 mmol/L (ref 3.5–5.1)
Sodium: 137 mmol/L (ref 135–145)

## 2017-12-29 LAB — CBG MONITORING, ED: Glucose-Capillary: 103 mg/dL — ABNORMAL HIGH (ref 65–99)

## 2017-12-29 LAB — CBC
HCT: 39.7 % (ref 39.0–52.0)
HEMOGLOBIN: 12.6 g/dL — AB (ref 13.0–17.0)
MCH: 29.9 pg (ref 26.0–34.0)
MCHC: 31.7 g/dL (ref 30.0–36.0)
MCV: 94.1 fL (ref 78.0–100.0)
Platelets: 116 10*3/uL — ABNORMAL LOW (ref 150–400)
RBC: 4.22 MIL/uL (ref 4.22–5.81)
RDW: 13.1 % (ref 11.5–15.5)
WBC: 5.8 10*3/uL (ref 4.0–10.5)

## 2017-12-29 LAB — I-STAT TROPONIN, ED: Troponin i, poc: 0 ng/mL (ref 0.00–0.08)

## 2017-12-29 NOTE — ED Notes (Signed)
  CBG 103  

## 2017-12-29 NOTE — ED Triage Notes (Addendum)
Pt endorses having a syncopal episode while washing dishes x 1 hour pta. Pt states that he remembered feeling nauseous just prior to event and has had a ha x 3 days. Denies CP or shob. Pt has medtronic pacemaker and in AV dual paced rhythm, denies palpitations. VSS. Axox4. No neuro deficits.

## 2017-12-30 ENCOUNTER — Emergency Department (HOSPITAL_COMMUNITY)
Admission: EM | Admit: 2017-12-30 | Discharge: 2017-12-30 | Disposition: A | Discharge status: Home / Self Care | Payer: Medicare Other | Source: Home / Self Care | Attending: Emergency Medicine | Admitting: Emergency Medicine

## 2017-12-30 ENCOUNTER — Emergency Department (HOSPITAL_COMMUNITY): Payer: Medicare Other

## 2017-12-30 ENCOUNTER — Other Ambulatory Visit: Payer: Self-pay

## 2017-12-30 DIAGNOSIS — M25561 Pain in right knee: Secondary | ICD-10-CM | POA: Diagnosis not present

## 2017-12-30 DIAGNOSIS — Y9389 Activity, other specified: Secondary | ICD-10-CM

## 2017-12-30 DIAGNOSIS — R55 Syncope and collapse: Secondary | ICD-10-CM | POA: Diagnosis not present

## 2017-12-30 DIAGNOSIS — Z950 Presence of cardiac pacemaker: Secondary | ICD-10-CM

## 2017-12-30 DIAGNOSIS — S82831A Other fracture of upper and lower end of right fibula, initial encounter for closed fracture: Secondary | ICD-10-CM | POA: Diagnosis not present

## 2017-12-30 DIAGNOSIS — Y998 Other external cause status: Secondary | ICD-10-CM

## 2017-12-30 DIAGNOSIS — Y929 Unspecified place or not applicable: Secondary | ICD-10-CM | POA: Insufficient documentation

## 2017-12-30 DIAGNOSIS — S82844A Nondisplaced bimalleolar fracture of right lower leg, initial encounter for closed fracture: Secondary | ICD-10-CM | POA: Insufficient documentation

## 2017-12-30 DIAGNOSIS — W010XXA Fall on same level from slipping, tripping and stumbling without subsequent striking against object, initial encounter: Secondary | ICD-10-CM | POA: Insufficient documentation

## 2017-12-30 DIAGNOSIS — Z79899 Other long term (current) drug therapy: Secondary | ICD-10-CM | POA: Insufficient documentation

## 2017-12-30 DIAGNOSIS — Z7982 Long term (current) use of aspirin: Secondary | ICD-10-CM

## 2017-12-30 DIAGNOSIS — S82434A Nondisplaced oblique fracture of shaft of right fibula, initial encounter for closed fracture: Secondary | ICD-10-CM

## 2017-12-30 DIAGNOSIS — I2510 Atherosclerotic heart disease of native coronary artery without angina pectoris: Secondary | ICD-10-CM

## 2017-12-30 DIAGNOSIS — S82843A Displaced bimalleolar fracture of unspecified lower leg, initial encounter for closed fracture: Principal | ICD-10-CM

## 2017-12-30 DIAGNOSIS — S82431A Displaced oblique fracture of shaft of right fibula, initial encounter for closed fracture: Secondary | ICD-10-CM | POA: Diagnosis not present

## 2017-12-30 DIAGNOSIS — I10 Essential (primary) hypertension: Secondary | ICD-10-CM

## 2017-12-30 DIAGNOSIS — S82841A Displaced bimalleolar fracture of right lower leg, initial encounter for closed fracture: Secondary | ICD-10-CM | POA: Diagnosis not present

## 2017-12-30 DIAGNOSIS — S82839A Other fracture of upper and lower end of unspecified fibula, initial encounter for closed fracture: Secondary | ICD-10-CM

## 2017-12-30 DIAGNOSIS — I959 Hypotension, unspecified: Secondary | ICD-10-CM | POA: Diagnosis not present

## 2017-12-30 LAB — URINALYSIS, ROUTINE W REFLEX MICROSCOPIC
Bilirubin Urine: NEGATIVE
Glucose, UA: NEGATIVE mg/dL
Hgb urine dipstick: NEGATIVE
Ketones, ur: NEGATIVE mg/dL
Leukocytes, UA: NEGATIVE
Nitrite: NEGATIVE
Protein, ur: NEGATIVE mg/dL
SPECIFIC GRAVITY, URINE: 1.013 (ref 1.005–1.030)
pH: 5 (ref 5.0–8.0)

## 2017-12-30 MED ORDER — TRAMADOL HCL 50 MG PO TABS
50.0000 mg | ORAL_TABLET | Freq: Once | ORAL | Status: AC
  Administered 2017-12-30: 50 mg via ORAL
  Filled 2017-12-30: qty 1

## 2017-12-30 MED ORDER — ONDANSETRON HCL 4 MG/2ML IJ SOLN
4.0000 mg | Freq: Once | INTRAMUSCULAR | Status: AC
  Administered 2017-12-30: 4 mg via INTRAVENOUS
  Filled 2017-12-30: qty 2

## 2017-12-30 MED ORDER — ONDANSETRON 8 MG PO TBDP: 8.0000 mg | ORAL_TABLET | Freq: Three times a day (TID) | ORAL | 0 refills | Status: DC | PRN

## 2017-12-30 MED ORDER — SODIUM CHLORIDE 0.9 % IV BOLUS (SEPSIS)
1000.0000 mL | Freq: Once | INTRAVENOUS | Status: AC
  Administered 2017-12-30: 1000 mL via INTRAVENOUS

## 2017-12-30 MED ORDER — TRAMADOL HCL 50 MG PO TABS: 50.0000 mg | ORAL_TABLET | Freq: Four times a day (QID) | ORAL | 0 refills | Status: DC | PRN

## 2017-12-30 NOTE — ED Triage Notes (Addendum)
Pt here c/o leg pain after falling yesterday, was seen here yesterday for syncope and hypotension-- pt took tylenol and relieved pain.  Pt concerned that he twisted right knee and right ankle when he fell.

## 2017-12-30 NOTE — ED Notes (Signed)
Patient ambulated in hallway with standby assist. Patient unsteady on feet, "states it was related to his knees". Denies dizziness or pain.

## 2017-12-30 NOTE — Discharge Instructions (Signed)
It was our pleasure to provide your ER care today - we hope that you feel better.  Your urine tests are good/normal.  Your xrays show a distal right fibula fracture, and possible medial malleolar fracture.  Wear camwalker. Elevate ankle. Icepack/cold to sore area.  Use walker.  Follow up with orthopedist this week - call office this Monday morning to arrange appointment. Take ultram as need for pain - no driving when taking.   For recent fainting episode, follow up with your primary care doctor this week.  Return to ER  if worse, new symptoms, intractable pain, fevers, weak/fainting, other concern.

## 2017-12-30 NOTE — Progress Notes (Signed)
Orthopedic Tech Progress Note Patient Details:  David Booth 1933-07-24 [id number]  Ortho Devices Type of Ortho Device: CAM walker Ortho Device/Splint Location: RLE Ortho Device/Splint Interventions: Ordered, Application   Post Interventions Patient Tolerated: Well Instructions Provided: Care of device   [name] 12/30/2017, 8:07 PM

## 2017-12-30 NOTE — ED Provider Notes (Signed)
[facility] EMERGENCY DEPARTMENT Provider Note   CSN: [id number] Arrival date & time: 12/29/17  1842     History   Chief Complaint Chief Complaint  Patient presents with  . Loss of Consciousness    HPI David Booth is a 84 y.o. male.  HPI Patient is an 84 year old male with a Medtronic pacemaker in place who had loss of consciousness at home.  He reports nausea over the past 3 days and decreased oral intake today.  He was standing at the kitchen sink washing dishes and the next thing he knew he was lying on the floor.  He states just prior to the event he felt more nauseated than normal.  He denies vomiting.  Denies diarrhea.  He has had a mild headache over the past several days without neck pain or neck stiffness.  No chest pain or shortness of breath.  Asymptomatic at this time.  Denies abdominal pain.  No dysuria or urinary frequency.  Denies weakness of his arms or legs.   Past Medical History:  Diagnosis Date  . Coronary artery disease   . Hypertension   . Neuropathy   . Numbness in feet   . Pacemaker     Patient Active Problem List   Diagnosis Date Noted  . PAT (paroxysmal atrial tachycardia) ([place]) 05/12/2017  . Neuropathy   . Hereditary and idiopathic peripheral neuropathy 09/30/2014  . Numbness in feet   . Paresthesia 07/31/2014  . RBBB 04/08/2013  . Tachy-brady syndrome ([place]) 04/07/2013  . Pacemaker - Medtronic Revo, dual chamber, MRI-conditional, implanted April 2011 04/07/2013  . Erectile dysfunction 04/07/2013  . 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 pace maker         Home Medications    Prior to Admission medications   Medication Sig Start Date End Date Taking? Authorizing Provider  Alpha-Lipoic Acid 100 MG TABS Take 1 tablet by mouth 3 (three) times daily.    [provider]  aspirin 81 MG tablet Take 81 mg by mouth daily.      [provider]  b  complex vitamins tablet Take 1 tablet by mouth daily.    [provider]  Cholecalciferol (VITAMIN D3) 2000 UNITS TABS Take 1 tablet by mouth daily.     [provider]  Coenzyme Q10 (CO Q 10) 100 MG CAPS Take 1 capsule by mouth daily.     [provider]  diltiazem (CARDIZEM CD) 120 MG 24 hr capsule TAKE 1 CAPSULE (120 MG TOTAL) BY MOUTH DAILY. 08/01/17   Croitoru, Mihai, MD  EPINEPHrine (EPI-PEN) 0.3 mg/0.3 mL DEVI Inject into the muscle as needed.    [provider]  Glucosamine-Chondroit-Vit C-Mn (GLUCOSAMINE CHONDR 1500 COMPLX) CAPS Take 1 capsule by mouth 2 (two) times daily.    [provider]  Lycopene 10 MG CAPS Take by mouth daily.    [provider]  MAGNESIUM PO Take 350 mg by mouth daily.    [provider]  metoprolol tartrate (LOPRESSOR) 25 MG tablet TAKE 1 TABLET BY MOUTH TWICE A DAY. PLEASE KEEP UPCOMING APPT FOR FUTHER REFILLS. 06/19/17   Croitoru, Mihai, MD  Multiple Vitamin (MULTIVITAMIN) tablet Take 1 tablet by mouth daily.    [provider]  Omega-3 Fatty Acids (FISH OIL) 1200 MG CAPS Take by mouth daily.    [provider]  ondansetron (ZOFRAN ODT) 8 MG disintegrating tablet Take 1 tablet (8  mg total) by mouth every 8 (eight) hours as needed for nausea or vomiting. 12/30/17   Jola Schmidt, MD  vitamin C (ASCORBIC ACID) 250 MG tablet Take 250 mg by mouth daily.    [provider]  vitamin E (VITAMIN E) 400 UNIT capsule Take 400 Units by mouth 3 (three) times daily.     [provider]    Family History Family History  Problem Relation Age of Onset  . Alzheimer's disease Mother   . Heart attack Father     Social History Social History   Tobacco Use  . Smoking status: Never Smoker  . Smokeless tobacco: Never Used  Substance Use Topics  . Alcohol use: No    Alcohol/week: 0.0 oz  . Drug use: No     Allergies   Bee venom   Review of Systems Review of Systems  All  other systems reviewed and are negative.    Physical Exam Updated Vital Signs BP (!) 121/57   Pulse 60   Temp 99.4 F (37.4 C) (Oral)   Resp 20   Ht 5\' 9"  (1.753 m)   Wt 68 kg (150 lb)   SpO2 93%   BMI 22.15 kg/m   Physical Exam  Constitutional: He is oriented to person, place, and time. He appears well-developed and well-nourished.  HENT:  Head: Normocephalic and atraumatic.  Eyes: EOM are normal.  Neck: Normal range of motion.  Cardiovascular: Normal rate, regular rhythm, normal heart sounds and intact distal pulses.  Pulmonary/Chest: Effort normal and breath sounds normal. No respiratory distress.  Abdominal: Soft. He exhibits no distension. There is no tenderness.  Musculoskeletal: Normal range of motion.  Neurological: He is alert and oriented to person, place, and time.  Skin: Skin is warm and dry.  Psychiatric: He has a normal mood and affect. Judgment normal.  Nursing note and vitals reviewed.    ED Treatments / Results  Labs (all labs ordered are listed, but only abnormal results are displayed) Labs Reviewed  BASIC METABOLIC PANEL - Abnormal; Notable for the following components:      Result Value   Glucose, Bld 120 (*)    GFR calc non Af Amer 52 (*)    All other components within normal limits  CBC - Abnormal; Notable for the following components:   Hemoglobin 12.6 (*)    Platelets 116 (*)    All other components within normal limits  CBG MONITORING, ED - Abnormal; Notable for the following components:   Glucose-Capillary 103 (*)    All other components within normal limits  I-STAT TROPONIN, ED    EKG  EKG Interpretation  Date/Time:  Friday December 29 2017 18:47:58 EST Ventricular Rate:  64 PR Interval:  166 QRS Duration: 130 QT Interval:  420 QTC Calculation: 433 R Axis:   111 Text Interpretation:  AV dual-paced rhythm Abnormal ECG No significant change was found Confirmed by Jola Schmidt [phone]) on 12/29/2017 11:48:47 PM       Radiology Ct  Head Wo Contrast  Result Date: 12/29/2017 CLINICAL DATA:  Syncopal episode EXAM: CT HEAD WITHOUT CONTRAST TECHNIQUE: Contiguous axial images were obtained from the base of the skull through the vertex without intravenous contrast. COMPARISON:  None. FINDINGS: Brain: No acute territorial infarction, hemorrhage or intracranial mass is visualized. Mild atrophy. Calcification posterior to the vermis is unchanged. Vascular: No hyperdense vessels.  Carotid vascular calcification Skull: No fracture Sinuses/Orbits: Mild mucosal thickening in the ethmoid sinuses. No acute orbital abnormality. Other: Mild right  forehead soft tissue swelling IMPRESSION: 1. No definite CT evidence for acute intracranial abnormality. 2. Mild atrophy Electronically Signed   By: Donavan Foil M.D.   On: 12/29/2017 19:17    Procedures Procedures (including critical care time)  Medications Ordered in ED Medications  sodium chloride 0.9 % bolus 1,000 mL (0 mLs Intravenous Stopped 12/30/17 0145)  ondansetron (ZOFRAN) injection 4 mg (4 mg Intravenous Given 12/30/17 0029)     Initial Impression / Assessment and Plan / ED Course  I have reviewed the triage vital signs and the nursing notes.  Pertinent labs & imaging results that were available during my care of the patient were reviewed by me and considered in my medical decision making (see chart for details).     Overall well-appearing.  Paced rhythm.  Pacemaker interrogated and demonstrates no arrhythmia or other abnormalities.  Pacemaker appears to be working well.  Rather asymptomatic at this time.  Abdominal exam is benign.  IV fluids given.  Isolated nausea without vomiting or diarrhea.  2:06 AM Patient feels better at this time.  Discharged home in good condition.  Ambulatory in the ER.  Primary care follow-up.  He understands return to the ER for new or worsening symptoms  Final Clinical Impressions(s) / ED Diagnoses   Final diagnoses:  Syncope, unspecified syncope type      ED Discharge Orders        Ordered    ondansetron (ZOFRAN ODT) 8 MG disintegrating tablet  Every 8 hours PRN     12/30/17 5945       Jola Schmidt, MD 12/30/17 0207

## 2017-12-30 NOTE — ED Provider Notes (Signed)
[facility] EMERGENCY DEPARTMENT Provider Note   CSN: [id number] Arrival date & time: 12/30/17  1329     History   Chief Complaint Chief Complaint  Patient presents with  . Hypotension  . Leg Pain    HPI LEGEND TUMMINELLO is a 85 y.o. male.  Patient c/o right ankle > right knee pain since fall yesterday.  Was evaluated for syncope. Today states feels much improved from that standpoint - no faintness or dizziness.  Pt states w yesterdays event, he fell onto right leg. C/o right ankle and knee pain since. Moderate. Persistent. Worse w weight bearing. No hip pain. Denies neck, back or radicular pain. No numbness/weakness. Denies headache. No chest pain or discomfort. No abd pain. No nvd. Denies blood loss, rectal bleeding or melena. No dysuria or gu c/o. Denies change in meds/new meds. No fever or chills.    The history is provided by the patient and a relative.    Past Medical History:  Diagnosis Date  . Coronary artery disease   . Hypertension   . Neuropathy   . Numbness in feet   . Pacemaker     Patient Active Problem List   Diagnosis Date Noted  . PAT (paroxysmal atrial tachycardia) ([place]) 05/12/2017  . Neuropathy   . Hereditary and idiopathic peripheral neuropathy 09/30/2014  . Numbness in feet   . Paresthesia 07/31/2014  . RBBB 04/08/2013  . Tachy-brady syndrome ([place]) 04/07/2013  . Pacemaker - Medtronic Revo, dual chamber, MRI-conditional, implanted April 2011 04/07/2013  . Erectile dysfunction 04/07/2013  . 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 pace maker         Home Medications    Prior to Admission medications   Medication Sig Start Date End Date Taking? Authorizing Provider  Alpha-Lipoic Acid 100 MG TABS Take 1 tablet by mouth 3 (three) times daily.    [provider]  aspirin 81 MG tablet Take 81 mg by mouth daily.      [provider]  b complex vitamins  tablet Take 1 tablet by mouth daily.    [provider]  Cholecalciferol (VITAMIN D3) 2000 UNITS TABS Take 1 tablet by mouth daily.     [provider]  Coenzyme Q10 (CO Q 10) 100 MG CAPS Take 1 capsule by mouth daily.     [provider]  diltiazem (CARDIZEM CD) 120 MG 24 hr capsule TAKE 1 CAPSULE (120 MG TOTAL) BY MOUTH DAILY. 08/01/17   Croitoru, Mihai, MD  EPINEPHrine (EPI-PEN) 0.3 mg/0.3 mL DEVI Inject into the muscle as needed.    [provider]  Glucosamine-Chondroit-Vit C-Mn (GLUCOSAMINE CHONDR 1500 COMPLX) CAPS Take 1 capsule by mouth 2 (two) times daily.    [provider]  Lycopene 10 MG CAPS Take by mouth daily.    [provider]  MAGNESIUM PO Take 350 mg by mouth daily.    [provider]  metoprolol tartrate (LOPRESSOR) 25 MG tablet TAKE 1 TABLET BY MOUTH TWICE A DAY. PLEASE KEEP UPCOMING APPT FOR FUTHER REFILLS. 06/19/17   Croitoru, Mihai, MD  Multiple Vitamin (MULTIVITAMIN) tablet Take 1 tablet by mouth daily.    [provider]  Omega-3 Fatty Acids (FISH OIL) 1200 MG CAPS Take by mouth daily.    [provider]  ondansetron (ZOFRAN ODT) 8 MG disintegrating tablet Take 1 tablet (8 mg total) by mouth every 8 (eight) hours as needed for  nausea or vomiting. 12/30/17   Jola Schmidt, MD  vitamin C (ASCORBIC ACID) 250 MG tablet Take 250 mg by mouth daily.    [provider]  vitamin E (VITAMIN E) 400 UNIT capsule Take 400 Units by mouth 3 (three) times daily.     [provider]    Family History Family History  Problem Relation Age of Onset  . Alzheimer's disease Mother   . Heart attack Father     Social History Social History   Tobacco Use  . Smoking status: Never Smoker  . Smokeless tobacco: Never Used  Substance Use Topics  . Alcohol use: No    Alcohol/week: 0.0 oz  . Drug use: No     Allergies   Bee venom   Review of Systems Review of Systems  Constitutional:  Negative for chills and fever.  HENT: Negative for sore throat.   Eyes: Negative for redness.  Respiratory: Negative for cough and shortness of breath.   Cardiovascular: Negative for chest pain.  Gastrointestinal: Negative for abdominal pain and vomiting.  Genitourinary: Negative for dysuria and flank pain.  Musculoskeletal: Negative for back pain and neck pain.  Skin: Negative for wound.  Neurological: Negative for weakness, numbness and headaches.  Hematological: Does not bruise/bleed easily.  Psychiatric/Behavioral: Negative for confusion.     Physical Exam Updated Vital Signs BP (!) 116/56   Pulse (!) 56   Resp 16   Ht 1.753 m (5\' 9" )   Wt 68 kg (150 lb)   SpO2 100%   BMI 22.15 kg/m   Physical Exam  Constitutional: He appears well-developed and well-nourished. No distress.  HENT:  Mouth/Throat: Oropharynx is clear and moist.  Eyes: Conjunctivae are normal. Pupils are equal, round, and reactive to light.  Neck: Normal range of motion. Neck supple. No tracheal deviation present. No thyromegaly present.  No stiffness or rigidity.   Cardiovascular: Normal rate, regular rhythm, normal heart sounds and intact distal pulses. Exam reveals no gallop and no friction rub.  No murmur heard. Pulmonary/Chest: Effort normal and breath sounds normal. No accessory muscle usage. No respiratory distress.  Abdominal: Soft. Bowel sounds are normal. He exhibits no distension. There is no tenderness.  No puls mass.   Genitourinary:  Genitourinary Comments: No cva tenderness  Musculoskeletal: He exhibits no edema.  sts and tenderness medial and lat malleolus right ankle. Dp/pt palp. Right knee stable. Mild tenderness right knee. No increased warmth or erythema to knee or ankle. No pain w rom at hip. No other focal bony tenderness. CTLS spine, non tender, aligned, no step off.   Neurological: He is alert.  Speech clear/fluent. Motor intact bil. sens grossly intact.   Skin: Skin is warm and  dry. He is not diaphoretic.  Psychiatric: He has a normal mood and affect.  Nursing note and vitals reviewed.    ED Treatments / Results  Labs (all labs ordered are listed, but only abnormal results are displayed) Results for orders placed or performed during the hospital encounter of 12/30/17  Urinalysis, Routine w reflex microscopic  Result Value Ref Range   Color, Urine YELLOW YELLOW   APPearance CLEAR CLEAR   Specific Gravity, Urine 1.013 1.005 - 1.030   pH 5.0 5.0 - 8.0   Glucose, UA NEGATIVE NEGATIVE mg/dL   Hgb urine dipstick NEGATIVE NEGATIVE   Bilirubin Urine NEGATIVE NEGATIVE   Ketones, ur NEGATIVE NEGATIVE mg/dL   Protein, ur NEGATIVE NEGATIVE mg/dL   Nitrite NEGATIVE NEGATIVE   Leukocytes, UA NEGATIVE  NEGATIVE   Dg Chest 2 View  Result Date: 12/30/2017 CLINICAL DATA:  Hypotension. EXAM: CHEST  2 VIEW COMPARISON:  Radiographs of April 09, 2010. FINDINGS: Stable cardiomediastinal silhouette. Left-sided pacemaker is unchanged in position. No pneumothorax or pleural effusion is noted. Minimal left basilar scarring or subsegmental atelectasis is noted. Right lung is clear. Bony thorax is unremarkable. IMPRESSION: Minimal left basilar subsegmental atelectasis or scarring. Electronically Signed   By: Marijo Conception, M.D.   On: 12/30/2017 17:22   Dg Tibia/fibula Right  Result Date: 12/30/2017 CLINICAL DATA:  Status post fall, pain EXAM: RIGHT TIBIA AND FIBULA - 2 VIEW COMPARISON:  None. FINDINGS: Oblique fracture of the distal fibular metaphysis without significant displacement or angulation. No other fracture or dislocation. Soft tissue swelling circumferentially around the ankle. IMPRESSION: 1. Oblique fracture of the distal fibular metaphysis without significant displacement or angulation with overlying soft tissue swelling. Electronically Signed   By: Kathreen Devoid   On: 12/30/2017 17:23   Dg Ankle Complete Right  Result Date: 12/30/2017 CLINICAL DATA:  Status post fall, ankle  pain and swelling EXAM: RIGHT ANKLE - COMPLETE 3+ VIEW COMPARISON:  None. FINDINGS: Oblique fracture of the distal fibular metaphysis without significant displacement or angulation. Subtle linear lucency on the oblique view involving the medial malleolus which may be projectional versus a subtle nondisplaced fracture. Correlate with point tenderness. No other fracture or dislocation. Soft tissue swelling circumferentially around the ankle. IMPRESSION: 1. Oblique fracture of the distal fibular metaphysis without significant displacement or angulation with overlying soft tissue swelling. 2. Subtle linear lucency on the oblique view involving the medial malleolus which may be projectional versus a subtle nondisplaced fracture. Electronically Signed   By: Kathreen Devoid   On: 12/30/2017 17:25   Ct Head Wo Contrast  Result Date: 12/29/2017 CLINICAL DATA:  Syncopal episode EXAM: CT HEAD WITHOUT CONTRAST TECHNIQUE: Contiguous axial images were obtained from the base of the skull through the vertex without intravenous contrast. COMPARISON:  None. FINDINGS: Brain: No acute territorial infarction, hemorrhage or intracranial mass is visualized. Mild atrophy. Calcification posterior to the vermis is unchanged. Vascular: No hyperdense vessels.  Carotid vascular calcification Skull: No fracture Sinuses/Orbits: Mild mucosal thickening in the ethmoid sinuses. No acute orbital abnormality. Other: Mild right forehead soft tissue swelling IMPRESSION: 1. No definite CT evidence for acute intracranial abnormality. 2. Mild atrophy Electronically Signed   By: Donavan Foil M.D.   On: 12/29/2017 19:17   Dg Knee Complete 4 Views Right  Result Date: 12/30/2017 CLINICAL DATA:  Status post fall, right knee pain EXAM: RIGHT KNEE - COMPLETE 4+ VIEW COMPARISON:  None. FINDINGS: No acute fracture or dislocation. No aggressive osseous lesion. No joint effusion. Joint spaces are maintained. IMPRESSION: No acute osseous injury of the right knee.  Electronically Signed   By: Kathreen Devoid   On: 12/30/2017 17:28    EKG  EKG Interpretation None       Radiology Dg Chest 2 View  Result Date: 12/30/2017 CLINICAL DATA:  Hypotension. EXAM: CHEST  2 VIEW COMPARISON:  Radiographs of April 09, 2010. FINDINGS: Stable cardiomediastinal silhouette. Left-sided pacemaker is unchanged in position. No pneumothorax or pleural effusion is noted. Minimal left basilar scarring or subsegmental atelectasis is noted. Right lung is clear. Bony thorax is unremarkable. IMPRESSION: Minimal left basilar subsegmental atelectasis or scarring. Electronically Signed   By: Marijo Conception, M.D.   On: 12/30/2017 17:22   Dg Tibia/fibula Right  Result Date: 12/30/2017 CLINICAL DATA:  Status  post fall, pain EXAM: RIGHT TIBIA AND FIBULA - 2 VIEW COMPARISON:  None. FINDINGS: Oblique fracture of the distal fibular metaphysis without significant displacement or angulation. No other fracture or dislocation. Soft tissue swelling circumferentially around the ankle. IMPRESSION: 1. Oblique fracture of the distal fibular metaphysis without significant displacement or angulation with overlying soft tissue swelling. Electronically Signed   By: Kathreen Devoid   On: 12/30/2017 17:23   Dg Ankle Complete Right  Result Date: 12/30/2017 CLINICAL DATA:  Status post fall, ankle pain and swelling EXAM: RIGHT ANKLE - COMPLETE 3+ VIEW COMPARISON:  None. FINDINGS: Oblique fracture of the distal fibular metaphysis without significant displacement or angulation. Subtle linear lucency on the oblique view involving the medial malleolus which may be projectional versus a subtle nondisplaced fracture. Correlate with point tenderness. No other fracture or dislocation. Soft tissue swelling circumferentially around the ankle. IMPRESSION: 1. Oblique fracture of the distal fibular metaphysis without significant displacement or angulation with overlying soft tissue swelling. 2. Subtle linear lucency on the oblique  view involving the medial malleolus which may be projectional versus a subtle nondisplaced fracture. Electronically Signed   By: Kathreen Devoid   On: 12/30/2017 17:25   Ct Head Wo Contrast  Result Date: 12/29/2017 CLINICAL DATA:  Syncopal episode EXAM: CT HEAD WITHOUT CONTRAST TECHNIQUE: Contiguous axial images were obtained from the base of the skull through the vertex without intravenous contrast. COMPARISON:  None. FINDINGS: Brain: No acute territorial infarction, hemorrhage or intracranial mass is visualized. Mild atrophy. Calcification posterior to the vermis is unchanged. Vascular: No hyperdense vessels.  Carotid vascular calcification Skull: No fracture Sinuses/Orbits: Mild mucosal thickening in the ethmoid sinuses. No acute orbital abnormality. Other: Mild right forehead soft tissue swelling IMPRESSION: 1. No definite CT evidence for acute intracranial abnormality. 2. Mild atrophy Electronically Signed   By: Donavan Foil M.D.   On: 12/29/2017 19:17   Dg Knee Complete 4 Views Right  Result Date: 12/30/2017 CLINICAL DATA:  Status post fall, right knee pain EXAM: RIGHT KNEE - COMPLETE 4+ VIEW COMPARISON:  None. FINDINGS: No acute fracture or dislocation. No aggressive osseous lesion. No joint effusion. Joint spaces are maintained. IMPRESSION: No acute osseous injury of the right knee. Electronically Signed   By: Kathreen Devoid   On: 12/30/2017 17:28    Procedures Procedures (including critical care time)  Medications Ordered in ED Medications - No data to display   Initial Impression / Assessment and Plan / ED Course  I have reviewed the triage vital signs and the nursing notes.  Pertinent labs & imaging results that were available during my care of the patient were reviewed by me and considered in my medical decision making (see chart for details).  Iv ns.   Reviewed nursing notes and prior charts for additional history.  Reviewed yesterdays labs.   Xrays.  xrays positive for fx, distal  fib and ?med mall.   Camwalker, walker.  Ultram for pain. Ice/elevate.  Pain improved. Distal pulses palp.   No faintness or dizziness today.  Patient currently appears stable for d/c.      Final Clinical Impressions(s) / ED Diagnoses   Final diagnoses:  None    ED Discharge Orders    None       Lajean Saver, MD 12/30/17 2104

## 2017-12-30 NOTE — ED Notes (Signed)
Signature pad malfunction in room, unable to obtain signature for discharge. Pt departed in NAD.

## 2018-01-02 ENCOUNTER — Encounter (INDEPENDENT_AMBULATORY_CARE_PROVIDER_SITE_OTHER): Payer: Self-pay | Admitting: Orthopaedic Surgery

## 2018-01-02 ENCOUNTER — Ambulatory Visit (INDEPENDENT_AMBULATORY_CARE_PROVIDER_SITE_OTHER): Payer: Medicare Other | Admitting: Orthopaedic Surgery

## 2018-01-02 VITALS — BP 101/59 | HR 63 | Ht 69.0 in | Wt 150.0 lb

## 2018-01-02 DIAGNOSIS — S82891A Other fracture of right lower leg, initial encounter for closed fracture: Secondary | ICD-10-CM | POA: Diagnosis not present

## 2018-01-02 DIAGNOSIS — S82899A Other fracture of unspecified lower leg, initial encounter for closed fracture: Secondary | ICD-10-CM

## 2018-01-02 NOTE — Progress Notes (Signed)
Office Visit Note   Patient: David Booth           Date of Birth: [birth year]           MRN: [id number] Visit Date: 01/02/2018              Requested by: [name], [address] [place], [place] [address] PCP: [name], MD   Assessment & Plan: Visit Diagnoses:  1. Closed malleolar fracture, unspecified laterality, initial encounter     Plan: Prescription for a walker given.  Friend with knee Roller that he can borrow . Short leg fiberglass cast applied..  Foot elevated.  Nonweightbearing with his cast.  Return in 2 weeks for cast off and re-casting since his swelling will be going down.  Cast immobilization for a total of 6 weeks.  Nonweightbearing discussed.  He understands that if he is weightbearing on his ankle it may displace and he will require surgery. Follow-Up Instructions: No Follow-up on file.   Orders:  No orders of the defined types were placed in this encounter.  No orders of the defined types were placed in this encounter.     Procedures: No procedures performed   Clinical Data: No additional findings.   Subjective: Chief Complaint  Patient presents with  . Right Ankle - Fracture    HPI 84 year old male with pacemaker was doing the dishes passed out fell and suffered a right lateral malleolar fracture.  There is minimal displacement.  His pacemaker was checked and he was told it is functioning appropriately.  He is here today with his sister.  He is taking tramadol for the pain.  Past history of peripheral neuropathy, PAT, pacemaker, tacky/bradycardia syndrome.  Review of Systems 14 point review of systems updated and is negative visit pertains to HPI other than as mentioned above.   Objective: Vital Signs: BP (!) 101/59   Pulse 63   Ht 5\' 9"  (1.753 m)   Wt 150 lb (68 kg)   BMI 22.15 kg/m   Physical Exam  Constitutional: He is oriented to person, place, and time. He appears well-developed and well-nourished.  HENT:    Head: Normocephalic and atraumatic.  Eyes: EOM are normal. Pupils are equal, round, and reactive to light.  Neck: No tracheal deviation present. No thyromegaly present.  Cardiovascular: Normal rate.  Pacemaker  Pulmonary/Chest: Effort normal. He has no wheezes.  Abdominal: Soft. Bowel sounds are normal.  Neurological: He is alert and oriented to person, place, and time.  Skin: Skin is warm and dry. Capillary refill takes less than 2 seconds.  Psychiatric: He has a normal mood and affect. His behavior is normal. Judgment and thought content normal.    Ortho Exam mild swelling mild ecchymosis laterally over the fibula.  Minimal tenderness over the medial malleolus.  No medial ecchymosis.  Pulses palpable good capillary refill.  Knee exam shows mild crepitus with knee extension he has some lower extremity weakness bilaterally.  Specialty Comments:  No specialty comments available.  Imaging: No results found.   PMFS History: Patient Active Problem List   Diagnosis Date Noted  . PAT (paroxysmal atrial tachycardia) ([place]) 05/12/2017  . Neuropathy   . Hereditary and idiopathic peripheral neuropathy 09/30/2014  . Numbness in feet   . Paresthesia 07/31/2014  . RBBB 04/08/2013  . Tachy-brady syndrome ([facility]) 04/07/2013  . Pacemaker - Medtronic Revo, dual chamber, MRI-conditional, implanted April 2011 04/07/2013  . Erectile dysfunction 04/07/2013  . 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 Coronary artery disease   . Hypertension   . Neuropathy   . Numbness in feet   . Pacemaker     Family History  Problem Relation Age of Onset  . Alzheimer's disease Mother   . Heart attack Father     Past Surgical History:  Procedure Laterality Date  . pace maker     Social History   Occupational History    Comment: retired  Tobacco Use  . Smoking status: Never Smoker  . Smokeless tobacco: Never Used  Substance and Sexual Activity  . Alcohol use:  No    Alcohol/week: 0.0 oz  . Drug use: No  . Sexual activity: Not on file

## 2018-01-03 DIAGNOSIS — R55 Syncope and collapse: Secondary | ICD-10-CM | POA: Diagnosis not present

## 2018-01-03 DIAGNOSIS — Z950 Presence of cardiac pacemaker: Secondary | ICD-10-CM | POA: Diagnosis not present

## 2018-01-03 DIAGNOSIS — E860 Dehydration: Secondary | ICD-10-CM | POA: Diagnosis not present

## 2018-01-03 DIAGNOSIS — R000 Tachycardia, unspecified: Secondary | ICD-10-CM | POA: Diagnosis not present

## 2018-01-03 DIAGNOSIS — Z6824 Body mass index (BMI) 24.0-24.9, adult: Secondary | ICD-10-CM | POA: Diagnosis not present

## 2018-01-03 DIAGNOSIS — N400 Enlarged prostate without lower urinary tract symptoms: Secondary | ICD-10-CM | POA: Diagnosis not present

## 2018-01-08 ENCOUNTER — Telehealth (INDEPENDENT_AMBULATORY_CARE_PROVIDER_SITE_OTHER): Payer: Self-pay | Admitting: Orthopaedic Surgery

## 2018-01-08 NOTE — Telephone Encounter (Signed)
I called and spoke with patient. He needs to come in for cast change. Could you please add to Dr. Lorin Mercy schedule at 12:45p tomorrow afternoon-thanks. Patient aware.

## 2018-01-08 NOTE — Telephone Encounter (Signed)
Patient called stating that the cast on his ankle is getting loose and is making his ankle sore and he would like to speak with you and ask you some questions. CB # [phone]

## 2018-01-09 ENCOUNTER — Encounter (INDEPENDENT_AMBULATORY_CARE_PROVIDER_SITE_OTHER): Payer: Self-pay | Admitting: Orthopaedic Surgery

## 2018-01-09 ENCOUNTER — Ambulatory Visit (INDEPENDENT_AMBULATORY_CARE_PROVIDER_SITE_OTHER): Payer: Medicare Other | Admitting: Orthopaedic Surgery

## 2018-01-09 DIAGNOSIS — S82899A Other fracture of unspecified lower leg, initial encounter for closed fracture: Secondary | ICD-10-CM

## 2018-01-09 NOTE — Progress Notes (Signed)
Patient returned early casted gotten loose his swelling went down.  New cast was applied he will follow-up as scheduled.

## 2018-01-17 ENCOUNTER — Ambulatory Visit (INDEPENDENT_AMBULATORY_CARE_PROVIDER_SITE_OTHER): Payer: Medicare Other | Admitting: Orthopaedic Surgery

## 2018-01-17 ENCOUNTER — Encounter (INDEPENDENT_AMBULATORY_CARE_PROVIDER_SITE_OTHER): Payer: Self-pay | Admitting: Orthopaedic Surgery

## 2018-01-17 ENCOUNTER — Ambulatory Visit (INDEPENDENT_AMBULATORY_CARE_PROVIDER_SITE_OTHER): Payer: Medicare Other

## 2018-01-17 DIAGNOSIS — S82899A Other fracture of unspecified lower leg, initial encounter for closed fracture: Secondary | ICD-10-CM

## 2018-01-17 NOTE — Progress Notes (Signed)
   Post-Op Visit Note   Patient: David Booth           Date of Birth: 1932-12-25           MRN: [id number] Visit Date: 01/17/2018 PCP: [name], MD   Assessment & Plan: Patient returns bottom of his cast is soft has been nonweightbearing he has mild swelling of the toes and needs to elevate his foot a little bit more.  Cast is reinforced.  2 view x-rays show good position alignment.  Follow-up 4 weeks for cast off x-rays out of cast and likely fracture CAM boot.   Chief Complaint:  Chief Complaint  Patient presents with  . Right Ankle - Fracture, Follow-up   Visit Diagnoses:  1. Closed malleolar fracture, unspecified laterality, initial encounter     Plan: Return visit 4 weeks for cast off and x-rays out of cast likely cam boot.  Follow-Up Instructions: Return in about 4 weeks (around 02/14/2018).   Orders:  Orders Placed This Encounter  Procedures  . XR Ankle Complete Right   No orders of the defined types were placed in this encounter.   Imaging: No results found.  PMFS History: Patient Active Problem List   Diagnosis Date Noted  . Closed malleolar fracture, unspecified laterality, initial encounter 01/02/2018  . PAT (paroxysmal atrial tachycardia) ([facility]) 05/12/2017  . Neuropathy   . Hereditary and idiopathic peripheral neuropathy 09/30/2014  . Numbness in feet   . Paresthesia 07/31/2014  . RBBB 04/08/2013  . Tachy-brady syndrome ([facility]) 04/07/2013  . Pacemaker - Medtronic Revo, dual chamber, MRI-conditional, implanted April 2011 04/07/2013  . Erectile dysfunction 04/07/2013  . Failure of pacemaker lead 04/07/2013   Past Medical History:  Diagnosis Date  . Coronary artery disease   . Hypertension   . Neuropathy   . Numbness in feet   . Pacemaker     Family History  Problem Relation Age of Onset  . Alzheimer's disease Mother   . Heart attack Father     Past Surgical History:  Procedure Laterality Date  . pace maker     Social History    Occupational History    Comment: retired  Tobacco Use  . Smoking status: Never Smoker  . Smokeless tobacco: Never Used  Substance and Sexual Activity  . Alcohol use: No    Alcohol/week: 0.0 oz  . Drug use: No  . Sexual activity: Not on file

## 2018-01-18 DIAGNOSIS — E860 Dehydration: Secondary | ICD-10-CM | POA: Diagnosis not present

## 2018-01-18 DIAGNOSIS — Z950 Presence of cardiac pacemaker: Secondary | ICD-10-CM | POA: Diagnosis not present

## 2018-01-18 DIAGNOSIS — Z6822 Body mass index (BMI) 22.0-22.9, adult: Secondary | ICD-10-CM | POA: Diagnosis not present

## 2018-01-18 DIAGNOSIS — R000 Tachycardia, unspecified: Secondary | ICD-10-CM | POA: Diagnosis not present

## 2018-01-18 DIAGNOSIS — R001 Bradycardia, unspecified: Secondary | ICD-10-CM | POA: Diagnosis not present

## 2018-01-18 DIAGNOSIS — R55 Syncope and collapse: Secondary | ICD-10-CM | POA: Diagnosis not present

## 2018-02-01 DIAGNOSIS — R000 Tachycardia, unspecified: Secondary | ICD-10-CM | POA: Diagnosis not present

## 2018-02-01 DIAGNOSIS — R55 Syncope and collapse: Secondary | ICD-10-CM | POA: Diagnosis not present

## 2018-02-01 DIAGNOSIS — H6121 Impacted cerumen, right ear: Secondary | ICD-10-CM | POA: Diagnosis not present

## 2018-02-01 DIAGNOSIS — J309 Allergic rhinitis, unspecified: Secondary | ICD-10-CM | POA: Diagnosis not present

## 2018-02-01 DIAGNOSIS — Z6824 Body mass index (BMI) 24.0-24.9, adult: Secondary | ICD-10-CM | POA: Diagnosis not present

## 2018-02-07 ENCOUNTER — Ambulatory Visit (INDEPENDENT_AMBULATORY_CARE_PROVIDER_SITE_OTHER): Payer: Medicare Other | Admitting: *Deleted

## 2018-02-07 DIAGNOSIS — I495 Sick sinus syndrome: Secondary | ICD-10-CM | POA: Diagnosis not present

## 2018-02-08 ENCOUNTER — Encounter: Payer: Self-pay | Admitting: Cardiology

## 2018-02-08 NOTE — Progress Notes (Signed)
Remote pacemaker transmission.   

## 2018-02-14 DIAGNOSIS — R000 Tachycardia, unspecified: Secondary | ICD-10-CM | POA: Diagnosis not present

## 2018-02-14 DIAGNOSIS — H6121 Impacted cerumen, right ear: Secondary | ICD-10-CM | POA: Diagnosis not present

## 2018-02-14 DIAGNOSIS — R001 Bradycardia, unspecified: Secondary | ICD-10-CM | POA: Diagnosis not present

## 2018-02-14 DIAGNOSIS — I959 Hypotension, unspecified: Secondary | ICD-10-CM | POA: Diagnosis not present

## 2018-02-20 ENCOUNTER — Ambulatory Visit (INDEPENDENT_AMBULATORY_CARE_PROVIDER_SITE_OTHER): Payer: Medicare Other | Admitting: Orthopaedic Surgery

## 2018-02-20 ENCOUNTER — Ambulatory Visit (INDEPENDENT_AMBULATORY_CARE_PROVIDER_SITE_OTHER): Payer: Medicare Other

## 2018-02-20 VITALS — BP 125/74 | HR 71

## 2018-02-20 DIAGNOSIS — M25571 Pain in right ankle and joints of right foot: Secondary | ICD-10-CM | POA: Diagnosis not present

## 2018-02-20 DIAGNOSIS — S82899A Other fracture of unspecified lower leg, initial encounter for closed fracture: Secondary | ICD-10-CM

## 2018-02-20 NOTE — Progress Notes (Signed)
   Post-Op Visit Note   Patient: David Booth           Date of Birth: 23-Aug-1933           MRN: [id number] Visit Date: 02/20/2018 PCP: [name], MD   Assessment & Plan: Discontinue cast.  We will place him in a cam boot.  He will wear the cam boot with ambulation he can remove it for taking a shower.  He can work on ankle range of motion exercises out of the boot.  I plan to recheck him in 6 weeks for final x-rays.  Chief Complaint:  Chief Complaint  Patient presents with  . Right Ankle - Follow-up   Visit Diagnoses: Follow-up right lateral malleolus fracture. Plan: Return in 6 weeks for final x-rays right ankle.  Cam boot applied.  Follow-Up Instructions: No follow-ups on file.   Orders:  Orders Placed This Encounter  Procedures  . XR Ankle Complete Right   No orders of the defined types were placed in this encounter.   Imaging: Xr Ankle Complete Right  Result Date: 02/20/2018 Three-view x-rays right ankle obtained and reviewed.  This shows distal fibular block fracture with interval healing.  Mortise remains well reduced. Impression: Healing right lateral malleolar fracture.   PMFS History: Patient Active Problem List   Diagnosis Date Noted  . Closed malleolar fracture, unspecified laterality, initial encounter 01/02/2018  . PAT (paroxysmal atrial tachycardia) ([facility]) 05/12/2017  . Neuropathy   . Hereditary and idiopathic peripheral neuropathy 09/30/2014  . Numbness in feet   . Paresthesia 07/31/2014  . RBBB 04/08/2013  . Tachy-brady syndrome ([facility]) 04/07/2013  . Pacemaker - Medtronic Revo, dual chamber, MRI-conditional, implanted April 2011 04/07/2013  . Erectile dysfunction 04/07/2013  . Failure of pacemaker lead 04/07/2013   Past Medical History:  Diagnosis Date  . Coronary artery disease   . Hypertension   . Neuropathy   . Numbness in feet   . Pacemaker     Family History  Problem Relation Age of Onset  . Alzheimer's disease Mother   . Heart  attack Father     Past Surgical History:  Procedure Laterality Date  . pace maker     Social History   Occupational History    Comment: retired  Tobacco Use  . Smoking status: Never Smoker  . Smokeless tobacco: Never Used  Substance and Sexual Activity  . Alcohol use: No    Alcohol/week: 0.0 oz  . Drug use: No  . Sexual activity: Not on file

## 2018-02-27 ENCOUNTER — Telehealth (INDEPENDENT_AMBULATORY_CARE_PROVIDER_SITE_OTHER): Payer: Self-pay | Admitting: Orthopaedic Surgery

## 2018-02-27 DIAGNOSIS — S8264XD Nondisplaced fracture of lateral malleolus of right fibula, subsequent encounter for closed fracture with routine healing: Secondary | ICD-10-CM | POA: Diagnosis not present

## 2018-02-27 DIAGNOSIS — Z950 Presence of cardiac pacemaker: Secondary | ICD-10-CM | POA: Diagnosis not present

## 2018-02-27 DIAGNOSIS — Z9181 History of falling: Secondary | ICD-10-CM | POA: Diagnosis not present

## 2018-02-27 DIAGNOSIS — I2510 Atherosclerotic heart disease of native coronary artery without angina pectoris: Secondary | ICD-10-CM | POA: Diagnosis not present

## 2018-02-27 DIAGNOSIS — R260 Ataxic gait: Secondary | ICD-10-CM | POA: Diagnosis not present

## 2018-02-27 DIAGNOSIS — M25571 Pain in right ankle and joints of right foot: Secondary | ICD-10-CM | POA: Diagnosis not present

## 2018-02-27 NOTE — Telephone Encounter (Signed)
David Booth, with [facility] called stating that they will continue to see him in regards to ROM and GAIT. They will send over the orders to the office.  CB#[phone].  Thank you.

## 2018-02-27 NOTE — Telephone Encounter (Signed)
fyi

## 2018-03-01 DIAGNOSIS — R260 Ataxic gait: Secondary | ICD-10-CM | POA: Diagnosis not present

## 2018-03-01 DIAGNOSIS — I2510 Atherosclerotic heart disease of native coronary artery without angina pectoris: Secondary | ICD-10-CM | POA: Diagnosis not present

## 2018-03-01 DIAGNOSIS — M25571 Pain in right ankle and joints of right foot: Secondary | ICD-10-CM | POA: Diagnosis not present

## 2018-03-01 DIAGNOSIS — Z950 Presence of cardiac pacemaker: Secondary | ICD-10-CM | POA: Diagnosis not present

## 2018-03-01 DIAGNOSIS — S8264XD Nondisplaced fracture of lateral malleolus of right fibula, subsequent encounter for closed fracture with routine healing: Secondary | ICD-10-CM | POA: Diagnosis not present

## 2018-03-01 DIAGNOSIS — Z9181 History of falling: Secondary | ICD-10-CM | POA: Diagnosis not present

## 2018-03-06 DIAGNOSIS — R260 Ataxic gait: Secondary | ICD-10-CM | POA: Diagnosis not present

## 2018-03-06 DIAGNOSIS — M25571 Pain in right ankle and joints of right foot: Secondary | ICD-10-CM | POA: Diagnosis not present

## 2018-03-06 DIAGNOSIS — I2510 Atherosclerotic heart disease of native coronary artery without angina pectoris: Secondary | ICD-10-CM | POA: Diagnosis not present

## 2018-03-06 DIAGNOSIS — Z950 Presence of cardiac pacemaker: Secondary | ICD-10-CM | POA: Diagnosis not present

## 2018-03-06 DIAGNOSIS — Z9181 History of falling: Secondary | ICD-10-CM | POA: Diagnosis not present

## 2018-03-06 DIAGNOSIS — S8264XD Nondisplaced fracture of lateral malleolus of right fibula, subsequent encounter for closed fracture with routine healing: Secondary | ICD-10-CM | POA: Diagnosis not present

## 2018-03-06 LAB — CUP PACEART REMOTE DEVICE CHECK
Battery Voltage: 2.9 V
Brady Statistic AS VS Percent: 4.29 %
Brady Statistic RA Percent Paced: 89.46 %
Date Time Interrogation Session: 20190410185137
Implantable Lead Implant Date: 20110429
Implantable Lead Location: 753859
Implantable Lead Location: 753860
Implantable Pulse Generator Implant Date: 20110429
Lead Channel Impedance Value: 464 Ohm
Lead Channel Setting Pacing Amplitude: 2 V
Lead Channel Setting Pacing Amplitude: 3 V
Lead Channel Setting Pacing Pulse Width: 0.8 ms
MDC IDC LEAD IMPLANT DT: 20110429
MDC IDC MSMT LEADCHNL RA SENSING INTR AMPL: 2.425 mV
MDC IDC MSMT LEADCHNL RV IMPEDANCE VALUE: 316 Ohm
MDC IDC MSMT LEADCHNL RV SENSING INTR AMPL: 2.02 mV
MDC IDC SET LEADCHNL RV SENSING SENSITIVITY: 1.2 mV
MDC IDC STAT BRADY AP VP PERCENT: 43.9 %
MDC IDC STAT BRADY AP VS PERCENT: 45.67 %
MDC IDC STAT BRADY AS VP PERCENT: 6.14 %
MDC IDC STAT BRADY RV PERCENT PACED: 50.15 %

## 2018-03-08 DIAGNOSIS — S8264XD Nondisplaced fracture of lateral malleolus of right fibula, subsequent encounter for closed fracture with routine healing: Secondary | ICD-10-CM | POA: Diagnosis not present

## 2018-03-08 DIAGNOSIS — Z950 Presence of cardiac pacemaker: Secondary | ICD-10-CM | POA: Diagnosis not present

## 2018-03-08 DIAGNOSIS — M25571 Pain in right ankle and joints of right foot: Secondary | ICD-10-CM | POA: Diagnosis not present

## 2018-03-08 DIAGNOSIS — R260 Ataxic gait: Secondary | ICD-10-CM | POA: Diagnosis not present

## 2018-03-08 DIAGNOSIS — I2510 Atherosclerotic heart disease of native coronary artery without angina pectoris: Secondary | ICD-10-CM | POA: Diagnosis not present

## 2018-03-08 DIAGNOSIS — Z9181 History of falling: Secondary | ICD-10-CM | POA: Diagnosis not present

## 2018-03-12 DIAGNOSIS — R260 Ataxic gait: Secondary | ICD-10-CM | POA: Diagnosis not present

## 2018-03-12 DIAGNOSIS — Z950 Presence of cardiac pacemaker: Secondary | ICD-10-CM | POA: Diagnosis not present

## 2018-03-12 DIAGNOSIS — I2510 Atherosclerotic heart disease of native coronary artery without angina pectoris: Secondary | ICD-10-CM | POA: Diagnosis not present

## 2018-03-12 DIAGNOSIS — Z9181 History of falling: Secondary | ICD-10-CM | POA: Diagnosis not present

## 2018-03-12 DIAGNOSIS — S8264XD Nondisplaced fracture of lateral malleolus of right fibula, subsequent encounter for closed fracture with routine healing: Secondary | ICD-10-CM | POA: Diagnosis not present

## 2018-03-12 DIAGNOSIS — M25571 Pain in right ankle and joints of right foot: Secondary | ICD-10-CM | POA: Diagnosis not present

## 2018-03-14 DIAGNOSIS — S8264XD Nondisplaced fracture of lateral malleolus of right fibula, subsequent encounter for closed fracture with routine healing: Secondary | ICD-10-CM | POA: Diagnosis not present

## 2018-03-14 DIAGNOSIS — I2510 Atherosclerotic heart disease of native coronary artery without angina pectoris: Secondary | ICD-10-CM | POA: Diagnosis not present

## 2018-03-14 DIAGNOSIS — R001 Bradycardia, unspecified: Secondary | ICD-10-CM | POA: Diagnosis not present

## 2018-03-14 DIAGNOSIS — Z6822 Body mass index (BMI) 22.0-22.9, adult: Secondary | ICD-10-CM | POA: Diagnosis not present

## 2018-03-14 DIAGNOSIS — Z9181 History of falling: Secondary | ICD-10-CM | POA: Diagnosis not present

## 2018-03-14 DIAGNOSIS — I959 Hypotension, unspecified: Secondary | ICD-10-CM | POA: Diagnosis not present

## 2018-03-14 DIAGNOSIS — R260 Ataxic gait: Secondary | ICD-10-CM | POA: Diagnosis not present

## 2018-03-14 DIAGNOSIS — J309 Allergic rhinitis, unspecified: Secondary | ICD-10-CM | POA: Diagnosis not present

## 2018-03-14 DIAGNOSIS — M25571 Pain in right ankle and joints of right foot: Secondary | ICD-10-CM | POA: Diagnosis not present

## 2018-03-14 DIAGNOSIS — Z950 Presence of cardiac pacemaker: Secondary | ICD-10-CM | POA: Diagnosis not present

## 2018-03-27 DIAGNOSIS — R260 Ataxic gait: Secondary | ICD-10-CM | POA: Diagnosis not present

## 2018-03-27 DIAGNOSIS — I2510 Atherosclerotic heart disease of native coronary artery without angina pectoris: Secondary | ICD-10-CM | POA: Diagnosis not present

## 2018-03-27 DIAGNOSIS — Z950 Presence of cardiac pacemaker: Secondary | ICD-10-CM | POA: Diagnosis not present

## 2018-03-27 DIAGNOSIS — Z9181 History of falling: Secondary | ICD-10-CM | POA: Diagnosis not present

## 2018-03-27 DIAGNOSIS — S8264XD Nondisplaced fracture of lateral malleolus of right fibula, subsequent encounter for closed fracture with routine healing: Secondary | ICD-10-CM | POA: Diagnosis not present

## 2018-03-27 DIAGNOSIS — M25571 Pain in right ankle and joints of right foot: Secondary | ICD-10-CM | POA: Diagnosis not present

## 2018-04-03 ENCOUNTER — Ambulatory Visit (INDEPENDENT_AMBULATORY_CARE_PROVIDER_SITE_OTHER): Payer: Medicare Other

## 2018-04-03 ENCOUNTER — Ambulatory Visit (INDEPENDENT_AMBULATORY_CARE_PROVIDER_SITE_OTHER): Payer: Medicare Other | Admitting: Orthopaedic Surgery

## 2018-04-03 ENCOUNTER — Encounter (INDEPENDENT_AMBULATORY_CARE_PROVIDER_SITE_OTHER): Payer: Self-pay | Admitting: Orthopaedic Surgery

## 2018-04-03 VITALS — BP 120/68 | HR 78 | Ht 69.0 in | Wt 155.0 lb

## 2018-04-03 DIAGNOSIS — S82899A Other fracture of unspecified lower leg, initial encounter for closed fracture: Secondary | ICD-10-CM

## 2018-04-03 NOTE — Progress Notes (Signed)
Office Visit Note   Patient: David Booth           Date of Birth: 1932/11/25           MRN: [id number] Visit Date: 04/03/2018              Requested by: [name], [address] [place], [place] [address] PCP: [name], MD   Assessment & Plan: Visit Diagnoses:  1. Closed malleolar fracture, unspecified laterality, initial encounter     Plan: Postop right lateral malleolar fracture.  He has mild swelling no tenderness at the fracture site and can discontinue the cam boot and go into a regular shoe.  He can weight-bear as tolerated.  He will continue to prop his foot up intermittently for the swelling.  Follow-Up Instructions: No follow-ups on file.   Orders:  Orders Placed This Encounter  Procedures  . XR Ankle Complete Right   No orders of the defined types were placed in this encounter.     Procedures: No procedures performed   Clinical Data: No additional findings.   Subjective: Chief Complaint  Patient presents with  . Right Ankle - Follow-up    HPI 84 year old male returns for follow-up of lateral malleolar fracture treated conservatively.  He was in a cast followed by a cam boot and now has good healing.  He is amatory without limp has good ankle range of motion greater than 75% compared to the opposite ankle.  He can gradually increase his strengthening resume regular work out activities uses regular tennis shoe and follow-up with me on an absence needed basis.  Review of Systems Reviewed 14 point updated unchanged.  Objective: Vital Signs: BP 120/68   Pulse 78   Ht 5\' 9"  (1.753 m)   Wt 155 lb (70.3 kg)   BMI 22.89 kg/m   Physical Exam  Constitutional: He is oriented to person, place, and time. He appears well-developed and well-nourished.  HENT:  Head: Normocephalic and atraumatic.  Eyes: Pupils are equal, round, and reactive to light. EOM are normal.  Neck: No tracheal deviation present. No thyromegaly present.    Cardiovascular: Normal rate.  Pulmonary/Chest: Effort normal. He has no wheezes.  Abdominal: Soft. Bowel sounds are normal.  Neurological: He is alert and oriented to person, place, and time.  Skin: Skin is warm and dry. Capillary refill takes less than 2 seconds.  Psychiatric: He has a normal mood and affect. His behavior is normal. Judgment and thought content normal.    Ortho Exam 75% plus ankle range of motion no tenderness of the fibular fracture site.  There is trace swelling laterally as expected.  X-rays show satisfactory healing.  Specialty Comments:  No specialty comments available.  Imaging: No results found.   PMFS History: Patient Active Problem List   Diagnosis Date Noted  . Closed malleolar fracture, unspecified laterality, initial encounter 01/02/2018  . PAT (paroxysmal atrial tachycardia) ([facility]) 05/12/2017  . Neuropathy   . Hereditary and idiopathic peripheral neuropathy 09/30/2014  . Numbness in feet   . Paresthesia 07/31/2014  . RBBB 04/08/2013  . Tachy-brady syndrome ([facility]) 04/07/2013  . Pacemaker - Medtronic Revo, dual chamber, MRI-conditional, implanted April 2011 04/07/2013  . Erectile dysfunction 04/07/2013  . Failure of pacemaker lead 04/07/2013   Past Medical History:  Diagnosis Date  . Coronary artery disease   . Hypertension   . Neuropathy   . Numbness in feet   . Pacemaker  Family History  Problem Relation Age of Onset  . Alzheimer's disease Mother   . Heart attack Father     Past Surgical History:  Procedure Laterality Date  . pace maker     Social History   Occupational History    Comment: retired  Tobacco Use  . Smoking status: Never Smoker  . Smokeless tobacco: Never Used  Substance and Sexual Activity  . Alcohol use: No    Alcohol/week: 0.0 oz  . Drug use: No  . Sexual activity: Not on file

## 2018-04-20 DIAGNOSIS — Z6822 Body mass index (BMI) 22.0-22.9, adult: Secondary | ICD-10-CM | POA: Diagnosis not present

## 2018-04-20 DIAGNOSIS — Z23 Encounter for immunization: Secondary | ICD-10-CM | POA: Diagnosis not present

## 2018-04-20 DIAGNOSIS — Z0000 Encounter for general adult medical examination without abnormal findings: Secondary | ICD-10-CM | POA: Diagnosis not present

## 2018-04-20 DIAGNOSIS — R000 Tachycardia, unspecified: Secondary | ICD-10-CM | POA: Diagnosis not present

## 2018-04-20 DIAGNOSIS — R001 Bradycardia, unspecified: Secondary | ICD-10-CM | POA: Diagnosis not present

## 2018-04-20 DIAGNOSIS — I959 Hypotension, unspecified: Secondary | ICD-10-CM | POA: Diagnosis not present

## 2018-05-08 ENCOUNTER — Other Ambulatory Visit: Payer: Self-pay | Admitting: Cardiovascular Disease

## 2018-05-08 NOTE — Telephone Encounter (Signed)
Rx sent to pharmacy   

## 2018-05-09 ENCOUNTER — Ambulatory Visit (INDEPENDENT_AMBULATORY_CARE_PROVIDER_SITE_OTHER): Payer: Medicare Other | Admitting: *Deleted

## 2018-05-09 DIAGNOSIS — I495 Sick sinus syndrome: Secondary | ICD-10-CM

## 2018-05-09 DIAGNOSIS — H2513 Age-related nuclear cataract, bilateral: Secondary | ICD-10-CM | POA: Diagnosis not present

## 2018-05-09 DIAGNOSIS — H43813 Vitreous degeneration, bilateral: Secondary | ICD-10-CM | POA: Diagnosis not present

## 2018-05-10 NOTE — Progress Notes (Signed)
Remote pacemaker transmission.   

## 2018-06-04 DIAGNOSIS — Z23 Encounter for immunization: Secondary | ICD-10-CM | POA: Diagnosis not present

## 2018-06-07 LAB — CUP PACEART REMOTE DEVICE CHECK
Battery Voltage: 2.88 V
Brady Statistic AP VS Percent: 55.57 %
Brady Statistic RV Percent Paced: 42.37 %
Date Time Interrogation Session: 20190710180703
Implantable Lead Implant Date: 20110429
Implantable Lead Location: 753860
Lead Channel Impedance Value: 464 Ohm
Lead Channel Sensing Intrinsic Amplitude: 2.693 mV
Lead Channel Setting Pacing Amplitude: 2 V
Lead Channel Setting Sensing Sensitivity: 1.2 mV
MDC IDC LEAD IMPLANT DT: 20110429
MDC IDC LEAD LOCATION: 753859
MDC IDC MSMT LEADCHNL RA SENSING INTR AMPL: 1.992 mV
MDC IDC MSMT LEADCHNL RV IMPEDANCE VALUE: 312 Ohm
MDC IDC PG IMPLANT DT: 20110429
MDC IDC SET LEADCHNL RV PACING AMPLITUDE: 3 V
MDC IDC SET LEADCHNL RV PACING PULSEWIDTH: 0.8 ms
MDC IDC STAT BRADY AP VP PERCENT: 38.28 %
MDC IDC STAT BRADY AS VP PERCENT: 3.95 %
MDC IDC STAT BRADY AS VS PERCENT: 2.2 %
MDC IDC STAT BRADY RA PERCENT PACED: 93.73 %

## 2018-07-04 ENCOUNTER — Ambulatory Visit (INDEPENDENT_AMBULATORY_CARE_PROVIDER_SITE_OTHER): Payer: Medicare Other | Admitting: Cardiovascular Disease

## 2018-07-04 ENCOUNTER — Encounter: Payer: Self-pay | Admitting: Cardiovascular Disease

## 2018-07-04 VITALS — BP 134/72 | HR 61 | Ht 69.0 in | Wt 160.0 lb

## 2018-07-04 DIAGNOSIS — I495 Sick sinus syndrome: Secondary | ICD-10-CM | POA: Diagnosis not present

## 2018-07-04 DIAGNOSIS — I4510 Unspecified right bundle-branch block: Secondary | ICD-10-CM | POA: Diagnosis not present

## 2018-07-04 DIAGNOSIS — R55 Syncope and collapse: Secondary | ICD-10-CM

## 2018-07-04 DIAGNOSIS — I471 Supraventricular tachycardia: Secondary | ICD-10-CM | POA: Diagnosis not present

## 2018-07-04 DIAGNOSIS — Z950 Presence of cardiac pacemaker: Secondary | ICD-10-CM

## 2018-07-04 NOTE — Patient Instructions (Signed)
Dr Sallyanne Kuster recommends that you continue on your current medications as directed. Please refer to the Current Medication list given to you today.  Remote monitoring is used to monitor your Pacemaker or ICD from home. This monitoring reduces the number of office visits required to check your device to one time per year. It allows [place] to keep an eye on the functioning of your device to ensure it is working properly. You are scheduled for a device check from home on Wednesday, October 9th, 2019. You may send your transmission at any time that day. If you have a wireless device, the transmission will be sent automatically. After your physician reviews your transmission, you will receive a postcard with your next transmission date.  To improve our patient care and to more adequately follow your device, [facility] has decided, as a practice, to start following each patient four times a year with your home monitor. This means that you may experience a remote appointment that is close to an in-office appointment with your physician. Your insurance will apply at the same rate as other remote monitoring transmissions.  Dr Sallyanne Kuster recommends that you schedule a follow-up appointment in 6 months with a pacemaker check. You will receive a reminder letter in the mail two months in advance. If you don't receive a letter, please call our office to schedule the follow-up appointment.  If you need a refill on your cardiac medications before your next appointment, please call your pharmacy.

## 2018-07-04 NOTE — Progress Notes (Signed)
Cardiology Office Note    Date:  07/04/2018   ID:  David Booth, DOB [birth year], MRN [id number]  PCP:  David Bien, MD  Cardiologist:   David Klein, MD   Chief Complaint  Patient presents with  . Pacemaker Check    History of Present Illness:  David Booth is a 84 y.o. male with a history of paroxysmal atrial tachycardia and sinus node dysfunction leading to implantation of a dual-chamber permanent pacemaker, here for follow-up.  In March 2019 [name] had a syncopal event at home.  It sounds vasovagal.  It was preceded by 3 days of nausea and poor oral intake.  It reminded him of the feeling he had while in the [employer] working out in the heat.  At that time he was able to prevent syncope taking salt tablets and drinking lots of water.  He he felt flushed and sweaty.  Unfortunately had a ankle fracture as a consequence of his fall.  He has not had any recurrence of syncope since.  His pacemaker was checked in the emergency room on March 2 and showed normal function.  He does have rather poor R wave sensing.  Is always had never been great but over the last for 5 years to have gradually reduced in amplitude and today we recorded R waves as low as 2.1 mV.  There has been a marked increase in the frequency of ventricular pacing to 41% over the last 6 months (compared with 12-15% before).  Sensitivity was set at 1.2 mV and we decreased it today to 0.9 mV.  Otherwise device function is normal.  The Medtronic regular device was implanted by Dr. Elisabeth Cara in 2011.  Both the atrial and ventricular leads are 5086 leads.  At this point the estimated generator longevity is 1.5-2 years, based on generator voltage of 2.88 V (RRT 2.81 V).  There is 94% atrial pacing and 40% ventricular pacing.  Activity level is stable at 1.5 hours/day.  There is no evidence of VT or A. fib.  The patient specifically denies any chest pain at rest exertion, dyspnea at rest or with exertion, orthopnea, paroxysmal  nocturnal dyspnea, syncope, palpitations, focal neurological deficits, intermittent claudication, lower extremity edema, unexplained weight gain, cough, hemoptysis or wheezing.  Past Medical History:  Diagnosis Date  . Coronary artery disease   . Hypertension   . Neuropathy   . Numbness in feet   . Pacemaker     Past Surgical History:  Procedure Laterality Date  . pace maker      Current Medications: Outpatient Medications Prior to Visit  Medication Sig Dispense Refill  . Alpha-Lipoic Acid 100 MG TABS Take 1 tablet by mouth 3 (three) times daily.    Marland Kitchen aspirin 81 MG tablet Take 81 mg by mouth daily.      Marland Kitchen b complex vitamins tablet Take 1 tablet by mouth daily.    . Cholecalciferol (VITAMIN D3) 2000 UNITS TABS Take 1 tablet by mouth daily.     . Coenzyme Q10 (CO Q 10) 100 MG CAPS Take 1 capsule by mouth daily.     Marland Kitchen diltiazem (CARDIZEM SR) 60 MG 12 hr capsule Take 60 mg by mouth 2 (two) times daily.    Marland Kitchen EPINEPHrine (EPI-PEN) 0.3 mg/0.3 mL DEVI Inject into the muscle as needed.    . Glucosamine-Chondroit-Vit C-Mn (GLUCOSAMINE CHONDR 1500 COMPLX) CAPS Take 1 capsule by mouth 2 (two) times daily.    Marland Kitchen loratadine (CLARITIN) 10 MG tablet  Take 10 mg by mouth daily.    . Lycopene 10 MG CAPS Take by mouth daily.    Marland Kitchen MAGNESIUM PO Take 350 mg by mouth daily.    . metoprolol tartrate (LOPRESSOR) 25 MG tablet Take 12.5 mg by mouth 2 (two) times daily.    . Multiple Vitamin (MULTIVITAMIN) tablet Take 1 tablet by mouth daily.    . Omega-3 Fatty Acids (FISH OIL) 1200 MG CAPS Take by mouth daily.    . vitamin C (ASCORBIC ACID) 250 MG tablet Take 250 mg by mouth daily.    . vitamin E (VITAMIN E) 400 UNIT capsule Take 400 Units by mouth 3 (three) times daily.     . traMADol (ULTRAM) 50 MG tablet Take 1 tablet (50 mg total) by mouth every 6 (six) hours as needed. 20 tablet 0  . diltiazem (CARDIZEM CD) 120 MG 24 hr capsule TAKE 1 CAPSULE (120 MG TOTAL) BY MOUTH DAILY. (Patient not taking: Reported  on 07/04/2018) 90 capsule 3  . metoprolol tartrate (LOPRESSOR) 25 MG tablet Take 1 tablet (25 mg total) by mouth 2 (two) times daily. Need OV for further refills. (Patient not taking: Reported on 07/04/2018) 180 tablet 0  . ondansetron (ZOFRAN ODT) 8 MG disintegrating tablet Take 1 tablet (8 mg total) by mouth every 8 (eight) hours as needed for nausea or vomiting. 10 tablet 0   No facility-administered medications prior to visit.      Allergies:   Bee venom   Social History   Socioeconomic History  . Marital status: Divorced    Spouse name: Not on file  . Number of children: 2  . Years of education: college  . Highest education level: Not on file  Occupational History    Comment: retired  Scientific laboratory technician  . Financial resource strain: Not on file  . Food insecurity:    Worry: Not on file    Inability: Not on file  . Transportation needs:    Medical: Not on file    Non-medical: Not on file  Tobacco Use  . Smoking status: Never Smoker  . Smokeless tobacco: Never Used  Substance and Sexual Activity  . Alcohol use: No    Alcohol/week: 0.0 standard drinks  . Drug use: No  . Sexual activity: Not on file  Lifestyle  . Physical activity:    Days per week: Not on file    Minutes per session: Not on file  . Stress: Not on file  Relationships  . Social connections:    Talks on phone: Not on file    Gets together: Not on file    Attends religious service: Not on file    Active member of club or organization: Not on file    Attends meetings of clubs or organizations: Not on file    Relationship status: Not on file  Other Topics Concern  . Not on file  Social History Narrative   Patient lives at home alone with his girlfriend.   Retired.    Education. College education.   Right handed.   Caffeine None.     Family History:  The patient's family history includes Alzheimer's disease in his mother; Heart attack in his father.   ROS:   Please see the history of present illness.      ROS All other systems reviewed and are negative.   PHYSICAL EXAM:   VS:  BP 134/72 (BP Location: Left Arm, Patient Position: Sitting, Cuff Size: Normal)   Pulse 61  Ht 5\' 9"  (1.753 m)   Wt 160 lb (72.6 kg)   BMI 23.63 kg/m      General: Alert, oriented x3, no distress, looks comfortable Head: no evidence of trauma, PERRL, EOMI, no exophtalmos or lid lag, no myxedema, no xanthelasma; normal ears, nose and oropharynx Neck: normal jugular venous pulsations and no hepatojugular reflux; brisk carotid pulses without delay and no carotid bruits Chest: clear to auscultation, no signs of consolidation by percussion or palpation, normal fremitus, symmetrical and full respiratory excursions Cardiovascular: normal position and quality of the apical impulse, regular rhythm, normal first and widely split second heart sounds, no murmurs, rubs or gallops Abdomen: no tenderness or distention, no masses by palpation, no abnormal pulsatility or arterial bruits, normal bowel sounds, no hepatosplenomegaly Extremities: no clubbing, cyanosis or edema; 2+ radial, ulnar and brachial pulses bilaterally; 2+ right femoral, posterior tibial and dorsalis pedis pulses; 2+ left femoral, posterior tibial and dorsalis pedis pulses; no subclavian or femoral bruits Neurological: grossly nonfocal Psych: Normal mood and affect  Wt Readings from Last 3 Encounters:  07/04/18 160 lb (72.6 kg)  04/03/18 155 lb (70.3 kg)  01/02/18 150 lb (68 kg)    Studies/Labs Reviewed:   EKG:  EKG is ordered today.  It shows atrial paced, ventricular sensed rhythm with long AV delay (246 ms), old right bundle branch block (QRS 136 ms), no ischemic repolarization abnormalities, QTC 408 ms. ASSESSMENT:    1. Tachy-brady syndrome ([place])   2. Pacemaker   3. RBBB   4. Vasovagal syncope      PLAN:  In order of problems listed above:  1. SSS: Almost entirely atrially paced rhythm, does have an underlying sinus bradycardia around 40.   Heart rate histogram distribution is appropriate for his activity level.  No changes made to sensor settings today. 2. PPM: He has chronically low right ventricular sensing and a mediocre right ventricular pacing threshold.  He now shows evidence of a fairly substantial increase in the burden of ventricular pacing which could be due to R wave undersensing.  Adjusted ventricular sensing today.  At this point, would still like to delay revision of the ventricular lead until he needs a generator change out, probably in the next couple of years. To keep his device "MRI conditional" it would be necessary to remove the chronic right ventricular lead so that he does not have any "abandoned" leads.  Since device implantation he has not required MRIs and there is no clear reason to suspect he will in the future.  Rather than pursue a potentially dangerous lead extraction, it may be better to simply place a redundant ventricular lead.  We will make that decision closer to the time of generator change out. 3. Chronic RBBB: It is also possible that there is progression of his intraventricular conduction abnormalities and that he will require increased burden of ventricular pacing regardless of her sensing changes. 4. Syncope: The presentation was highly suggestive of a vasovagal event, preceded by GI problems.  No reason to suspect that the right ventricular sensing had anything to do with his syncope.  He has a stable ventricular capture threshold and I doubt that his syncope was related to pacemaker malfunction.    Medication Adjustments/Labs and Tests Ordered: Current medicines are reviewed at length with the patient today.  Concerns regarding medicines are outlined above.  Medication changes, Labs and Tests ordered today are listed in the Patient Instructions below. Patient Instructions  Dr Sallyanne Kuster recommends that you continue on  your current medications as directed. Please refer to the Current Medication list  given to you today.  Remote monitoring is used to monitor your Pacemaker or ICD from home. This monitoring reduces the number of office visits required to check your device to one time per year. It allows [place] to keep an eye on the functioning of your device to ensure it is working properly. You are scheduled for a device check from home on Wednesday, October 9th, 2019. You may send your transmission at any time that day. If you have a wireless device, the transmission will be sent automatically. After your physician reviews your transmission, you will receive a postcard with your next transmission date.  To improve our patient care and to more adequately follow your device, [facility] has decided, as a practice, to start following each patient four times a year with your home monitor. This means that you may experience a remote appointment that is close to an in-office appointment with your physician. Your insurance will apply at the same rate as other remote monitoring transmissions.  Dr Sallyanne Kuster recommends that you schedule a follow-up appointment in 6 months with a pacemaker check. You will receive a reminder letter in the mail two months in advance. If you don't receive a letter, please call our office to schedule the follow-up appointment.  If you need a refill on your cardiac medications before your next appointment, please call your pharmacy.    Signed, David Klein, MD  07/04/2018 3:22 PM    [facility] [place], [place], [place]  [address] Phone: [phone]; Fax: [phone]

## 2018-07-17 DIAGNOSIS — R001 Bradycardia, unspecified: Secondary | ICD-10-CM | POA: Diagnosis not present

## 2018-07-17 DIAGNOSIS — I959 Hypotension, unspecified: Secondary | ICD-10-CM | POA: Diagnosis not present

## 2018-07-20 DIAGNOSIS — I959 Hypotension, unspecified: Secondary | ICD-10-CM | POA: Diagnosis not present

## 2018-07-20 DIAGNOSIS — Z6822 Body mass index (BMI) 22.0-22.9, adult: Secondary | ICD-10-CM | POA: Diagnosis not present

## 2018-07-20 DIAGNOSIS — Z950 Presence of cardiac pacemaker: Secondary | ICD-10-CM | POA: Diagnosis not present

## 2018-07-20 DIAGNOSIS — Z23 Encounter for immunization: Secondary | ICD-10-CM | POA: Diagnosis not present

## 2018-07-20 DIAGNOSIS — E782 Mixed hyperlipidemia: Secondary | ICD-10-CM | POA: Diagnosis not present

## 2018-07-20 DIAGNOSIS — R001 Bradycardia, unspecified: Secondary | ICD-10-CM | POA: Diagnosis not present

## 2018-07-20 DIAGNOSIS — R000 Tachycardia, unspecified: Secondary | ICD-10-CM | POA: Diagnosis not present

## 2018-07-23 DIAGNOSIS — Z23 Encounter for immunization: Secondary | ICD-10-CM | POA: Diagnosis not present

## 2018-08-02 ENCOUNTER — Other Ambulatory Visit: Payer: Self-pay | Admitting: Cardiovascular Disease

## 2018-08-08 ENCOUNTER — Ambulatory Visit (INDEPENDENT_AMBULATORY_CARE_PROVIDER_SITE_OTHER): Payer: Medicare Other | Admitting: *Deleted

## 2018-08-08 DIAGNOSIS — I495 Sick sinus syndrome: Secondary | ICD-10-CM | POA: Diagnosis not present

## 2018-08-08 NOTE — Progress Notes (Signed)
Remote pacemaker transmission.   

## 2018-09-13 LAB — CUP PACEART REMOTE DEVICE CHECK
Battery Voltage: 2.88 V
Brady Statistic AP VP Percent: 0.17 %
Brady Statistic RA Percent Paced: 99.1 %
Brady Statistic RV Percent Paced: 0.23 %
Date Time Interrogation Session: 20191009181125
Implantable Lead Implant Date: 20110429
Implantable Lead Implant Date: 20110429
Implantable Lead Location: 753859
Implantable Lead Location: 753860
Implantable Pulse Generator Implant Date: 20110429
Lead Channel Impedance Value: 464 Ohm
Lead Channel Setting Pacing Amplitude: 2 V
Lead Channel Setting Pacing Amplitude: 3 V
Lead Channel Setting Pacing Pulse Width: 0.8 ms
MDC IDC MSMT LEADCHNL RA SENSING INTR AMPL: 2.338 mV
MDC IDC MSMT LEADCHNL RV IMPEDANCE VALUE: 332 Ohm
MDC IDC MSMT LEADCHNL RV SENSING INTR AMPL: 2.356 mV
MDC IDC SET LEADCHNL RV SENSING SENSITIVITY: 0.9 mV
MDC IDC STAT BRADY AP VS PERCENT: 99 %
MDC IDC STAT BRADY AS VP PERCENT: 0.04 %
MDC IDC STAT BRADY AS VS PERCENT: 0.78 %

## 2018-11-07 ENCOUNTER — Ambulatory Visit (INDEPENDENT_AMBULATORY_CARE_PROVIDER_SITE_OTHER): Payer: Medicare Other

## 2018-11-07 DIAGNOSIS — I495 Sick sinus syndrome: Secondary | ICD-10-CM

## 2018-11-08 LAB — CUP PACEART REMOTE DEVICE CHECK
Battery Voltage: 2.88 V
Brady Statistic AP VP Percent: 0.3 %
Brady Statistic AS VP Percent: 0.04 %
Brady Statistic RA Percent Paced: 98.76 %
Brady Statistic RV Percent Paced: 0.36 %
Implantable Lead Implant Date: 20110429
Implantable Pulse Generator Implant Date: 20110429
Lead Channel Impedance Value: 332 Ohm
Lead Channel Sensing Intrinsic Amplitude: 2.295 mV
Lead Channel Sensing Intrinsic Amplitude: 2.356 mV
Lead Channel Setting Pacing Amplitude: 2 V
Lead Channel Setting Pacing Amplitude: 3 V
Lead Channel Setting Pacing Pulse Width: 0.8 ms
Lead Channel Setting Sensing Sensitivity: 0.9 mV
MDC IDC LEAD IMPLANT DT: 20110429
MDC IDC LEAD LOCATION: 753859
MDC IDC LEAD LOCATION: 753860
MDC IDC MSMT LEADCHNL RA IMPEDANCE VALUE: 456 Ohm
MDC IDC SESS DTM: 20200108163144
MDC IDC STAT BRADY AP VS PERCENT: 98.57 %
MDC IDC STAT BRADY AS VS PERCENT: 1.09 %

## 2018-11-09 NOTE — Progress Notes (Signed)
Remote pacemaker transmission.   

## 2018-11-19 DIAGNOSIS — R001 Bradycardia, unspecified: Secondary | ICD-10-CM | POA: Diagnosis not present

## 2018-11-19 DIAGNOSIS — E782 Mixed hyperlipidemia: Secondary | ICD-10-CM | POA: Diagnosis not present

## 2018-11-19 DIAGNOSIS — Z79899 Other long term (current) drug therapy: Secondary | ICD-10-CM | POA: Diagnosis not present

## 2018-11-23 DIAGNOSIS — R001 Bradycardia, unspecified: Secondary | ICD-10-CM | POA: Diagnosis not present

## 2018-11-23 DIAGNOSIS — R000 Tachycardia, unspecified: Secondary | ICD-10-CM | POA: Diagnosis not present

## 2018-11-23 DIAGNOSIS — E782 Mixed hyperlipidemia: Secondary | ICD-10-CM | POA: Diagnosis not present

## 2018-11-23 DIAGNOSIS — Z6825 Body mass index (BMI) 25.0-25.9, adult: Secondary | ICD-10-CM | POA: Diagnosis not present

## 2018-11-23 DIAGNOSIS — I10 Essential (primary) hypertension: Secondary | ICD-10-CM | POA: Diagnosis not present

## 2018-11-23 DIAGNOSIS — I959 Hypotension, unspecified: Secondary | ICD-10-CM | POA: Diagnosis not present

## 2018-11-30 LAB — CUP PACEART INCLINIC DEVICE CHECK
Date Time Interrogation Session: 20200131151435
Implantable Lead Implant Date: 20110429
Implantable Lead Location: 753860
Implantable Pulse Generator Implant Date: 20110429
MDC IDC LEAD IMPLANT DT: 20110429
MDC IDC LEAD LOCATION: 753859

## 2018-12-14 DIAGNOSIS — I10 Essential (primary) hypertension: Secondary | ICD-10-CM | POA: Diagnosis not present

## 2018-12-14 DIAGNOSIS — R000 Tachycardia, unspecified: Secondary | ICD-10-CM | POA: Diagnosis not present

## 2018-12-14 DIAGNOSIS — Z950 Presence of cardiac pacemaker: Secondary | ICD-10-CM | POA: Diagnosis not present

## 2018-12-14 DIAGNOSIS — R001 Bradycardia, unspecified: Secondary | ICD-10-CM | POA: Diagnosis not present

## 2019-01-21 DIAGNOSIS — R000 Tachycardia, unspecified: Secondary | ICD-10-CM | POA: Diagnosis not present

## 2019-01-21 DIAGNOSIS — I10 Essential (primary) hypertension: Secondary | ICD-10-CM | POA: Diagnosis not present

## 2019-01-21 DIAGNOSIS — R001 Bradycardia, unspecified: Secondary | ICD-10-CM | POA: Diagnosis not present

## 2019-01-21 DIAGNOSIS — Z719 Counseling, unspecified: Secondary | ICD-10-CM | POA: Diagnosis not present

## 2019-02-06 ENCOUNTER — Ambulatory Visit (INDEPENDENT_AMBULATORY_CARE_PROVIDER_SITE_OTHER): Payer: Medicare Other | Admitting: *Deleted

## 2019-02-06 ENCOUNTER — Other Ambulatory Visit: Payer: Self-pay

## 2019-02-06 DIAGNOSIS — I495 Sick sinus syndrome: Secondary | ICD-10-CM | POA: Diagnosis not present

## 2019-02-06 DIAGNOSIS — R55 Syncope and collapse: Secondary | ICD-10-CM

## 2019-02-06 LAB — CUP PACEART REMOTE DEVICE CHECK
Battery Voltage: 2.86 V
Brady Statistic AP VP Percent: 0.1 %
Brady Statistic AP VS Percent: 98 %
Brady Statistic AS VP Percent: 0.02 %
Brady Statistic AS VS Percent: 1.87 %
Brady Statistic RA Percent Paced: 97.88 %
Brady Statistic RV Percent Paced: 0.13 %
Date Time Interrogation Session: 20200408122427
Implantable Lead Implant Date: 20110429
Implantable Lead Implant Date: 20110429
Implantable Lead Location: 753859
Implantable Lead Location: 753860
Implantable Pulse Generator Implant Date: 20110429
Lead Channel Impedance Value: 328 Ohm
Lead Channel Impedance Value: 480 Ohm
Lead Channel Sensing Intrinsic Amplitude: 1.862 mV
Lead Channel Sensing Intrinsic Amplitude: 3.366 mV
Lead Channel Setting Pacing Amplitude: 2 V
Lead Channel Setting Pacing Amplitude: 3 V
Lead Channel Setting Pacing Pulse Width: 0.8 ms
Lead Channel Setting Sensing Sensitivity: 0.9 mV

## 2019-02-15 NOTE — Progress Notes (Signed)
Remote pacemaker transmission.   

## 2019-02-19 ENCOUNTER — Telehealth: Payer: Self-pay

## 2019-02-19 NOTE — Telephone Encounter (Signed)
Patient returned your call.

## 2019-02-19 NOTE — Telephone Encounter (Signed)
Left message for patient to contact the office to give consent for tomorrow's virtual visit. Called patients daughter and she stated she would have her dad give the office a call.

## 2019-02-19 NOTE — Telephone Encounter (Signed)
Virtual Visit Pre-Appointment Phone Call  "David Booth, I am calling you today to discuss your upcoming appointment. We are currently trying to limit exposure to the virus that causes COVID-19 by seeing patients at home rather than in the office."  1. "What is the BEST phone number to call the day of the visit?" - include this in appointment notes  2. "Do you have or have access to (through a family member/friend) a smartphone with video capability that we can use for your visit?" a. If yes - list this number in appt notes as "cell" (if different from BEST phone #) and list the appointment type as a VIDEO visit in appointment notes b. If no - list the appointment type as a PHONE visit in appointment notes  3. Confirm consent - "In the setting of the current Covid19 crisis, you are scheduled for a VIDEO visit with your provider on 02/20/2019 at 11:20AM.  Just as we do with many in-office visits, in order for you to participate in this visit, we must obtain consent.  If you'd like, I can send this to your mychart (if signed up) or email for you to review.  Otherwise, I can obtain your verbal consent now.  All virtual visits are billed to your insurance company just like a normal visit would be.  By agreeing to a virtual visit, we'd like you to understand that the technology does not allow for your provider to perform an examination, and thus may limit your provider's ability to fully assess your condition. If your provider identifies any concerns that need to be evaluated in person, we will make arrangements to do so.  Finally, though the technology is pretty good, we cannot assure that it will always work on either your or our end, and in the setting of a video visit, we may have to convert it to a phone-only visit.  In either situation, we cannot ensure that we have a secure connection.  Are you willing to proceed?" STAFF: Did the patient verbally acknowledge consent to telehealth visit? Document YES/NO  here: YES  4. Advise patient to be prepared - "Two hours prior to your appointment, go ahead and check your blood pressure, pulse, oxygen saturation, and your weight (if you have the equipment to check those) and write them all down. When your visit starts, your provider will ask you for this information. If you have an Apple Watch or Kardia device, please plan to have heart rate information ready on the day of your appointment. Please have a pen and paper handy nearby the day of the visit as well."  5. Give patient instructions for MyChart download to smartphone OR Doximity/[email or web address].me as below if video visit (depending on what platform provider is using)  6. Inform patient they will receive a phone call 15 minutes prior to their appointment time (may be from unknown caller ID) so they should be prepared to answer    TELEPHONE CALL NOTE  David Booth has been deemed a candidate for a follow-up tele-health visit to limit community exposure during the Covid-19 pandemic. I spoke with the patient via phone to ensure availability of phone/video source, confirm preferred email & phone number, and discuss instructions and expectations.  I reminded David Booth to be prepared with any vital sign and/or heart rhythm information that could potentially be obtained via home monitoring, at the time of his visit. I reminded David Booth to expect a phone call prior to his  visit.  Harold Hedge, CMA 02/19/2019 1:34 PM   INSTRUCTIONS FOR DOWNLOADING THE MYCHART APP TO SMARTPHONE  - The patient must first make sure to have activated MyChart and know their login information - If Apple, go to [employer] and type in MyChart in the search bar and download the app. If Android, ask patient to go to [employer] and type in [place] in the search bar and download the app. The app is free but as with any other app downloads, their phone may require them to verify saved payment information or Apple/Android password.   - The patient will need to then log into the app with their MyChart username and password, and select [facility] as their healthcare provider to link the account. When it is time for your visit, go to the MyChart app, find appointments, and click Begin Video Visit. Be sure to Select Allow for your device to access the Microphone and Camera for your visit. You will then be connected, and your provider will be with you shortly.  **If they have any issues connecting, or need assistance please contact MyChart service desk [phone])**  **If using a computer, in order to ensure the best quality for their visit they will need to use either of the following Internet Browsers: [employer], or Google Chrome**  IF USING DOXIMITY or DOXY.ME - The patient will receive a link just prior to their visit by text.     FULL LENGTH CONSENT FOR TELE-HEALTH VISIT   I hereby voluntarily request, consent and authorize [facility] and its employed or contracted physicians, physician assistants, nurse practitioners or other licensed health care professionals (the Practitioner), to provide me with telemedicine health care services (the "Services") as deemed necessary by the treating Practitioner. I acknowledge and consent to receive the Services by the Practitioner via telemedicine. I understand that the telemedicine visit will involve communicating with the Practitioner through live audiovisual communication technology and the disclosure of certain medical information by electronic transmission. I acknowledge that I have been given the opportunity to request an in-person assessment or other available alternative prior to the telemedicine visit and am voluntarily participating in the telemedicine visit.  I understand that I have the right to withhold or withdraw my consent to the use of telemedicine in the course of my care at any time, without affecting my right to future care or treatment, and that  the Practitioner or I may terminate the telemedicine visit at any time. I understand that I have the right to inspect all information obtained and/or recorded in the course of the telemedicine visit and may receive copies of available information for a reasonable fee.  I understand that some of the potential risks of receiving the Services via telemedicine include:  Marland Kitchen Delay or interruption in medical evaluation due to technological equipment failure or disruption; . Information transmitted may not be sufficient (e.g. poor resolution of images) to allow for appropriate medical decision making by the Practitioner; and/or  . In rare instances, security protocols could fail, causing a breach of personal health information.  Furthermore, I acknowledge that it is my responsibility to provide information about my medical history, conditions and care that is complete and accurate to the best of my ability. I acknowledge that Practitioner's advice, recommendations, and/or decision may be based on factors not within their control, such as incomplete or inaccurate data provided by me or distortions of diagnostic images or specimens that may result from electronic transmissions. I  understand that the practice of medicine is not an exact science and that Practitioner makes no warranties or guarantees regarding treatment outcomes. I acknowledge that I will receive a copy of this consent concurrently upon execution via email to the email address I last provided but may also request a printed copy by calling the office of [facility].    I understand that my insurance will be billed for this visit.   I have read or had this consent read to me. . I understand the contents of this consent, which adequately explains the benefits and risks of the Services being provided via telemedicine.  . I have been provided ample opportunity to ask questions regarding this consent and the Services and have had my questions answered to  my satisfaction. . I give my informed consent for the services to be provided through the use of telemedicine in my medical care  By participating in this telemedicine visit I agree to the above.

## 2019-02-20 ENCOUNTER — Encounter: Payer: Self-pay | Admitting: Cardiovascular Disease

## 2019-02-20 ENCOUNTER — Telehealth (INDEPENDENT_AMBULATORY_CARE_PROVIDER_SITE_OTHER): Payer: Medicare Other | Admitting: Cardiovascular Disease

## 2019-02-20 DIAGNOSIS — I471 Supraventricular tachycardia: Secondary | ICD-10-CM

## 2019-02-20 DIAGNOSIS — I495 Sick sinus syndrome: Secondary | ICD-10-CM

## 2019-02-20 DIAGNOSIS — I4510 Unspecified right bundle-branch block: Secondary | ICD-10-CM

## 2019-02-20 DIAGNOSIS — T82110D Breakdown (mechanical) of cardiac electrode, subsequent encounter: Secondary | ICD-10-CM | POA: Diagnosis not present

## 2019-02-20 DIAGNOSIS — Z950 Presence of cardiac pacemaker: Secondary | ICD-10-CM

## 2019-02-20 NOTE — Patient Instructions (Addendum)
STOP taking diltiazem. Continue remote downloads every 3 months. Office visit pacemaker check in 9 months.Call 3 months before to schedule.

## 2019-02-20 NOTE — Progress Notes (Signed)
Virtual Visit via Video Note   This visit type was conducted due to national recommendations for restrictions regarding the COVID-19 Pandemic (e.g. social distancing) in an effort to limit this patient's exposure and mitigate transmission in our community.  Due to his co-morbid illnesses, this patient is at least at moderate risk for complications without adequate follow up.  This format is felt to be most appropriate for this patient at this time.  All issues noted in this document were discussed and addressed.  A limited physical exam was performed with this format.  Please refer to the patient's chart for his consent to telehealth for [facility]   Evaluation Performed:  Follow-up visit  Date:  02/20/2019   ID:  David, Booth [birth year], MRN [id number]  Patient Location: Home Provider Location: Home  PCP:  [name], MD  Cardiologist:  Rutger Salton Electrophysiologist:  None   Chief Complaint:  Pacemaker followup, history of syncope  History of Present Illness:    David Booth is a 85 y.o. male with a history of tachycardia-bradycardia syndrome, paroxysmal atrial tachycardia, dual-chamber permanent pacemaker, chronic right bundle branch block, history of vasovagal syncope, essential hypertension.  Roughly a year ago he had an episode of syncope with symptoms strongly suggestive of hypotension/vasovagal event.  He had an ankle fracture at that time.  That problem has not recurred, but he sometimes notices that his blood pressure is exceedingly low about 2 hours after he takes his medications (as low as 90 mmHg).  He rarely has symptoms of dizziness.  He is not aware of any palpitations.  He denies chest pain or dyspnea at rest or with activity.  He walks daily in his neighborhood, carefully avoiding contact with other people.  He is having his groceries delivered to his home.  His pacemaker was implanted by Dr. Elisabeth Cara in 2011.  Battery voltage is 2.86 V (RRT at 2.81 V) and I  suspect he will require generator change out in the next 1-1.5 years.  R wave sensing have been mediocre for the last several years, but last year his R waves were as small as 2.1 mV in the office and he had a marked increase in ventricular pacing to 40%.  We decreased the sensitivity on the ventricular channel to 0.9 mV and no longer has virtually any ventricular pacing (0.1%).  The pacing impedance remains normal at 328 ohms.  Over the last 3 months the R waves have been as low as 1.7 mV.  He continues to have normal parameters on the atrial lead, 98% atrial pacing with appropriate heart rate histograms.  The patient does not have symptoms concerning for COVID-19 infection (fever, chills, cough, or new shortness of breath).    Past Medical History:  Diagnosis Date  . Coronary artery disease   . Hypertension   . Neuropathy   . Numbness in feet   . Pacemaker    Past Surgical History:  Procedure Laterality Date  . pace maker       Current Meds  Medication Sig  . Alpha-Lipoic Acid 100 MG TABS Take 1 tablet by mouth 3 (three) times daily.  Marland Kitchen aspirin 81 MG tablet Take 81 mg by mouth daily.    Marland Kitchen b complex vitamins tablet Take 1 tablet by mouth daily.  . Cholecalciferol (VITAMIN D3) 2000 UNITS TABS Take 1 tablet by mouth daily.   . Coenzyme Q10 (CO Q 10) 100 MG CAPS Take 1 capsule by mouth daily.   Marland Kitchen  diltiazem (CARDIZEM) 30 MG tablet Take 30 mg by mouth 2 (two) times daily.  Marland Kitchen EPINEPHrine (EPI-PEN) 0.3 mg/0.3 mL DEVI Inject into the muscle as needed.  . Glucosamine-Chondroit-Vit C-Mn (GLUCOSAMINE CHONDR 1500 COMPLX) CAPS Take 1 capsule by mouth 2 (two) times daily.  Marland Kitchen loratadine (CLARITIN) 10 MG tablet Take 10 mg by mouth daily.  . Lycopene 10 MG CAPS Take by mouth daily.  Marland Kitchen MAGNESIUM PO Take 350 mg by mouth daily.  . metoprolol tartrate (LOPRESSOR) 25 MG tablet TAKE 1 TABLET (25 MG TOTAL) BY MOUTH 2 (TWO) TIMES DAILY. NEED OV FOR FURTHER REFILLS.  . Multiple Vitamin (MULTIVITAMIN) tablet  Take 1 tablet by mouth daily.  . Omega-3 Fatty Acids (FISH OIL) 1200 MG CAPS Take by mouth daily.  . vitamin C (ASCORBIC ACID) 250 MG tablet Take 250 mg by mouth daily.  . vitamin E (VITAMIN E) 400 UNIT capsule Take 400 Units by mouth 3 (three) times daily.      Allergies:   Bee venom   Social History   Tobacco Use  . Smoking status: Never Smoker  . Smokeless tobacco: Never Used  Substance Use Topics  . Alcohol use: No    Alcohol/week: 0.0 standard drinks  . Drug use: No     Family Hx: The patient's family history includes Alzheimer's disease in his mother; Heart attack in his father.  ROS:   Please see the history of present illness.     All other systems reviewed and are negative.   Prior CV studies:   The following studies were reviewed today:  Comprehensive pacemaker download from February 06, 2019  Labs/Other Tests and Data Reviewed:    EKG:  An ECG dated July 04, 2018 was personally reviewed today and demonstrated:  Atrial paced, ventricular sensed rhythm with long AV delay (246 ms) and chronic right bundle branch block  Recent Labs: No results found for requested labs within last 8760 hours.   Recent Lipid Panel No results found for: CHOL, TRIG, HDL, CHOLHDL, LDLCALC, LDLDIRECT  Wt Readings from Last 3 Encounters:  07/04/18 160 lb (72.6 kg)  04/03/18 155 lb (70.3 kg)  01/02/18 150 lb (68 kg)     Objective:    Vital Signs:  BP 131/81   Pulse 68   Ht 5\' 9"  (1.753 m)   BMI 23.63 kg/m    VITAL SIGNS:  reviewed GEN:  no acute distress EYES:  sclerae anicteric, EOMI - Extraocular Movements Intact RESPIRATORY:  normal respiratory effort, symmetric expansion CARDIOVASCULAR:  no peripheral edema SKIN:  no rash, lesions or ulcers. MUSCULOSKELETAL:  no obvious deformities. NEURO:  alert and oriented x 3, no obvious focal deficit PSYCH:  normal affect  ASSESSMENT & PLAN:    1. SSS: Sensor settings appear appropriate.  No symptoms of chronotropic  incompetence. 2. PM: After changes in R wave sensing, ventricular pacing has been virtually abolished.  He does have conduction abnormalities with a long AV delay and right bundle branch block and may eventually require ventricular pacing.  When the time comes from his generator change out he should receive a new right ventricular lead.  He has never required MRIs.  I think performing a ventricular lead extraction would be unnecessarily risky.  Plan to place a new RV lead and abandon the old one, which unfortunately makes his pacing system no longer MRI-Conditional . 3. PAT: This has not been a problem since his last pacemaker download or really over the last year.  Stop diltiazem. 4.  HTN: Blood pressure sometimes trending low and he has a history of hypotensive syncope.  We will stop the diltiazem.  COVID-19 Education: The signs and symptoms of COVID-19 were discussed with the patient and how to seek care for testing (follow up with PCP or arrange E-visit).  The importance of social distancing was discussed today.  Time:   Today, I have spent 16 minutes with the patient with telehealth technology discussing the above problems.     Medication Adjustments/Labs and Tests Ordered: Current medicines are reviewed at length with the patient today.  Concerns regarding medicines are outlined above.   Tests Ordered: No orders of the defined types were placed in this encounter.   Medication Changes: No orders of the defined types were placed in this encounter.   Disposition:  Follow up 9 months in-office pacemaker check  Signed, Sanda Klein, MD  02/20/2019 11:16 AM    [place]

## 2019-03-31 IMAGING — DX DG TIBIA/FIBULA 2V*R*
3 series · 3 of 3 positions shown · non-contrast
Comparison: None.

CLINICAL DATA: Status post fall, pain

EXAM:
RIGHT TIBIA AND FIBULA - 2 VIEW

[tibia ap]
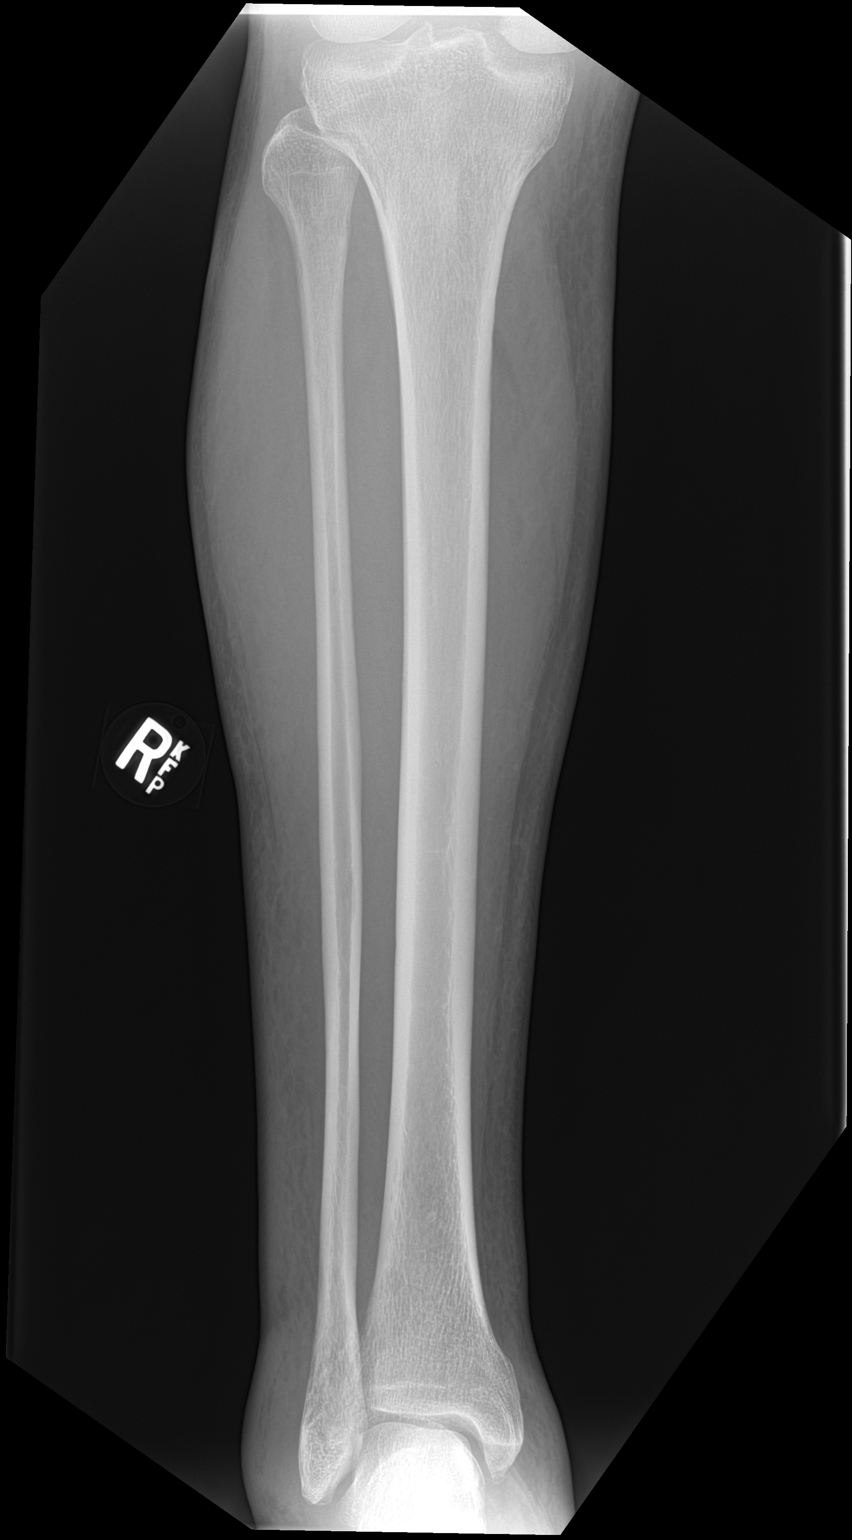

[tibia lat (1 of 2)]
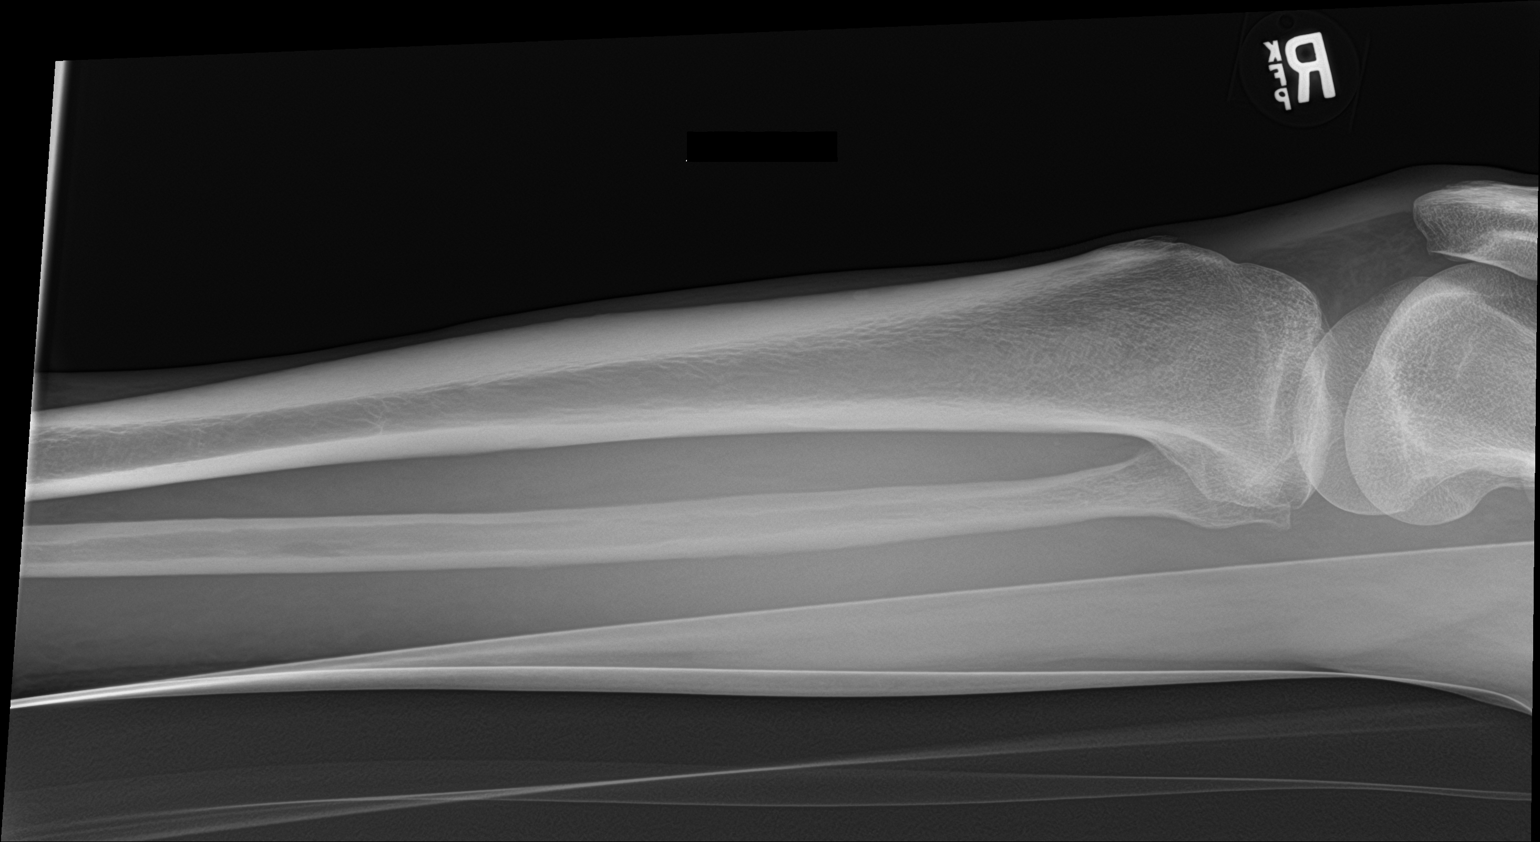

[tibia lat (2 of 2)]
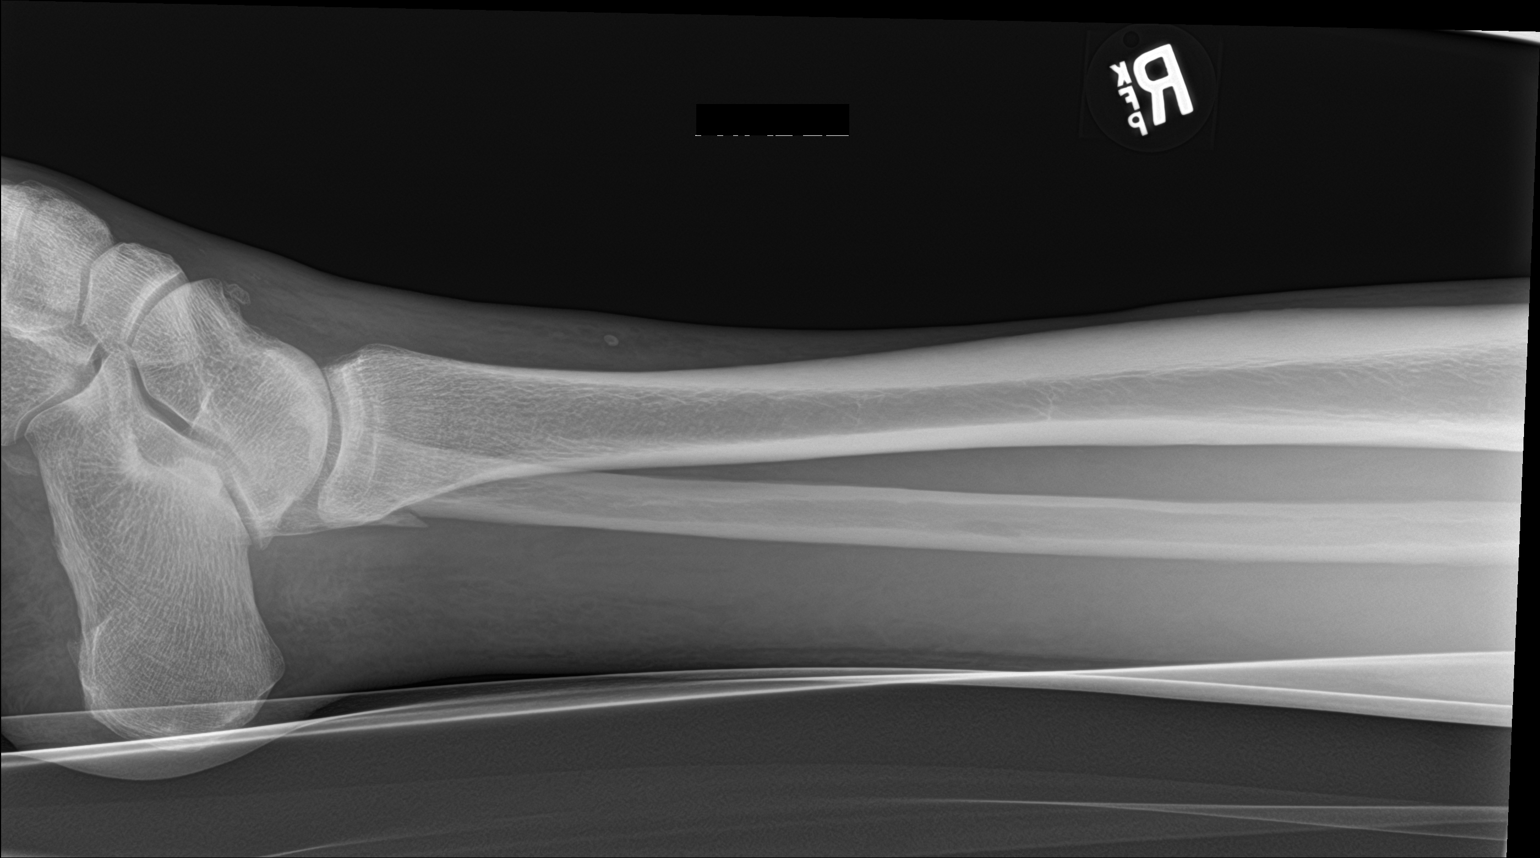

[3 of 3 positions shown; findings below may reference images not displayed]

FINDINGS: Oblique fracture of the distal fibular metaphysis without
significant displacement or angulation. No other fracture or
dislocation. Soft tissue swelling circumferentially around the
ankle.
IMPRESSION: 1. Oblique fracture of the distal fibular metaphysis without
significant displacement or angulation with overlying soft tissue
swelling.

## 2019-03-31 IMAGING — DX DG ANKLE COMPLETE 3+V*R*
3 series · 3 of 3 positions shown · non-contrast
Comparison: None.

CLINICAL DATA: Status post fall, ankle pain and swelling

EXAM:
RIGHT ANKLE - COMPLETE 3+ VIEW

[ankle ap]
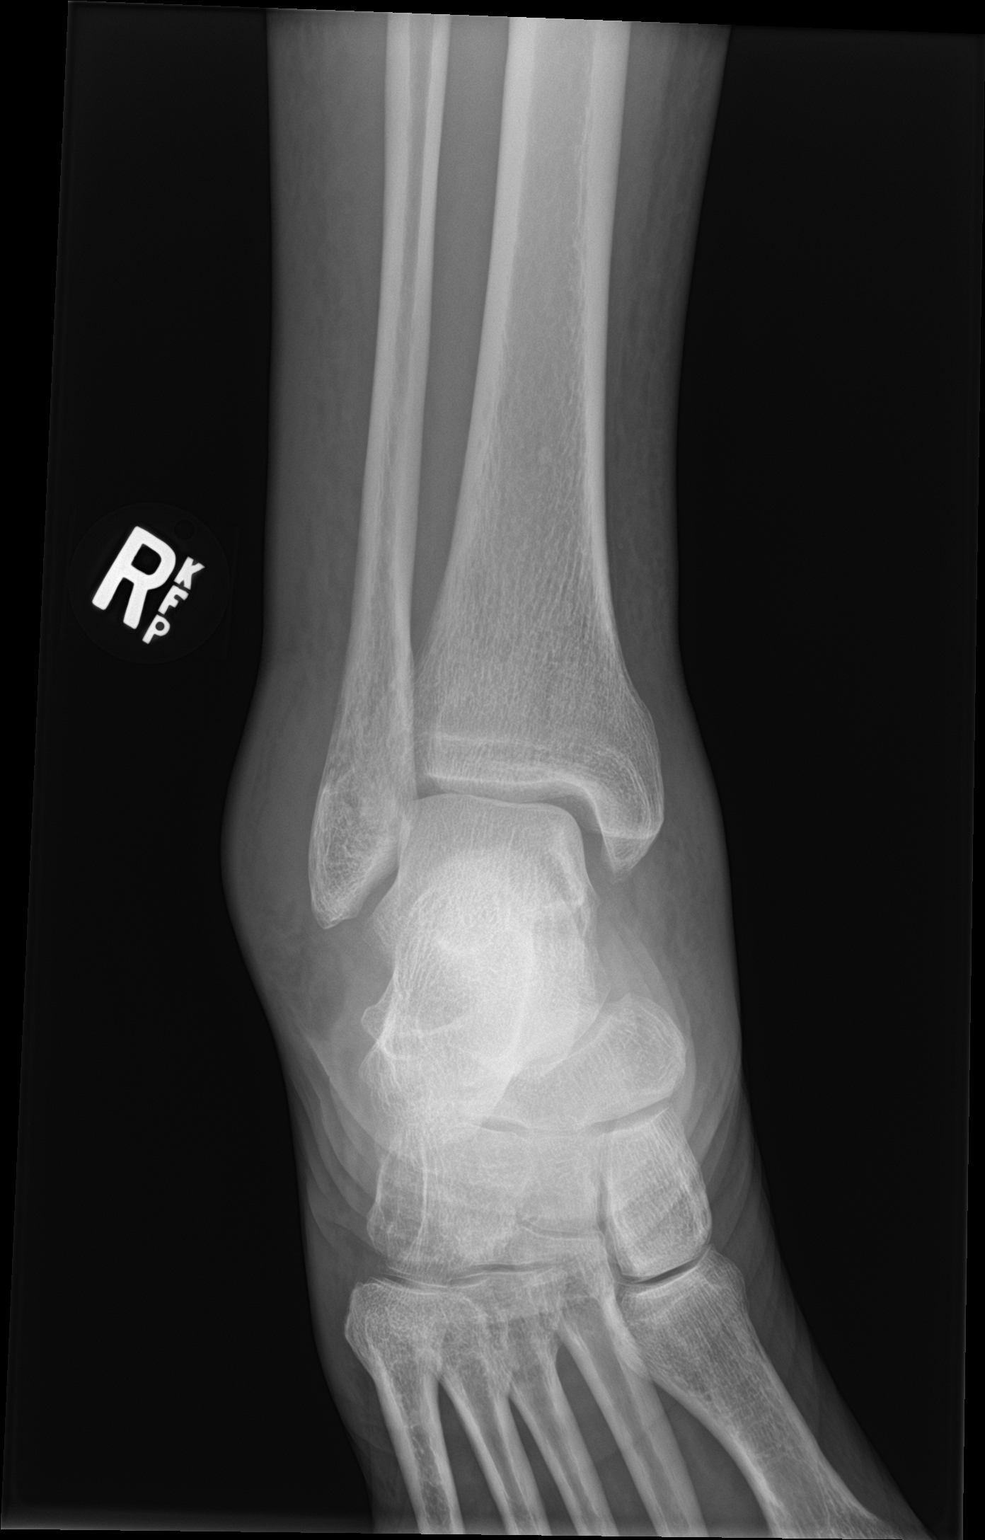

[ankle obl]
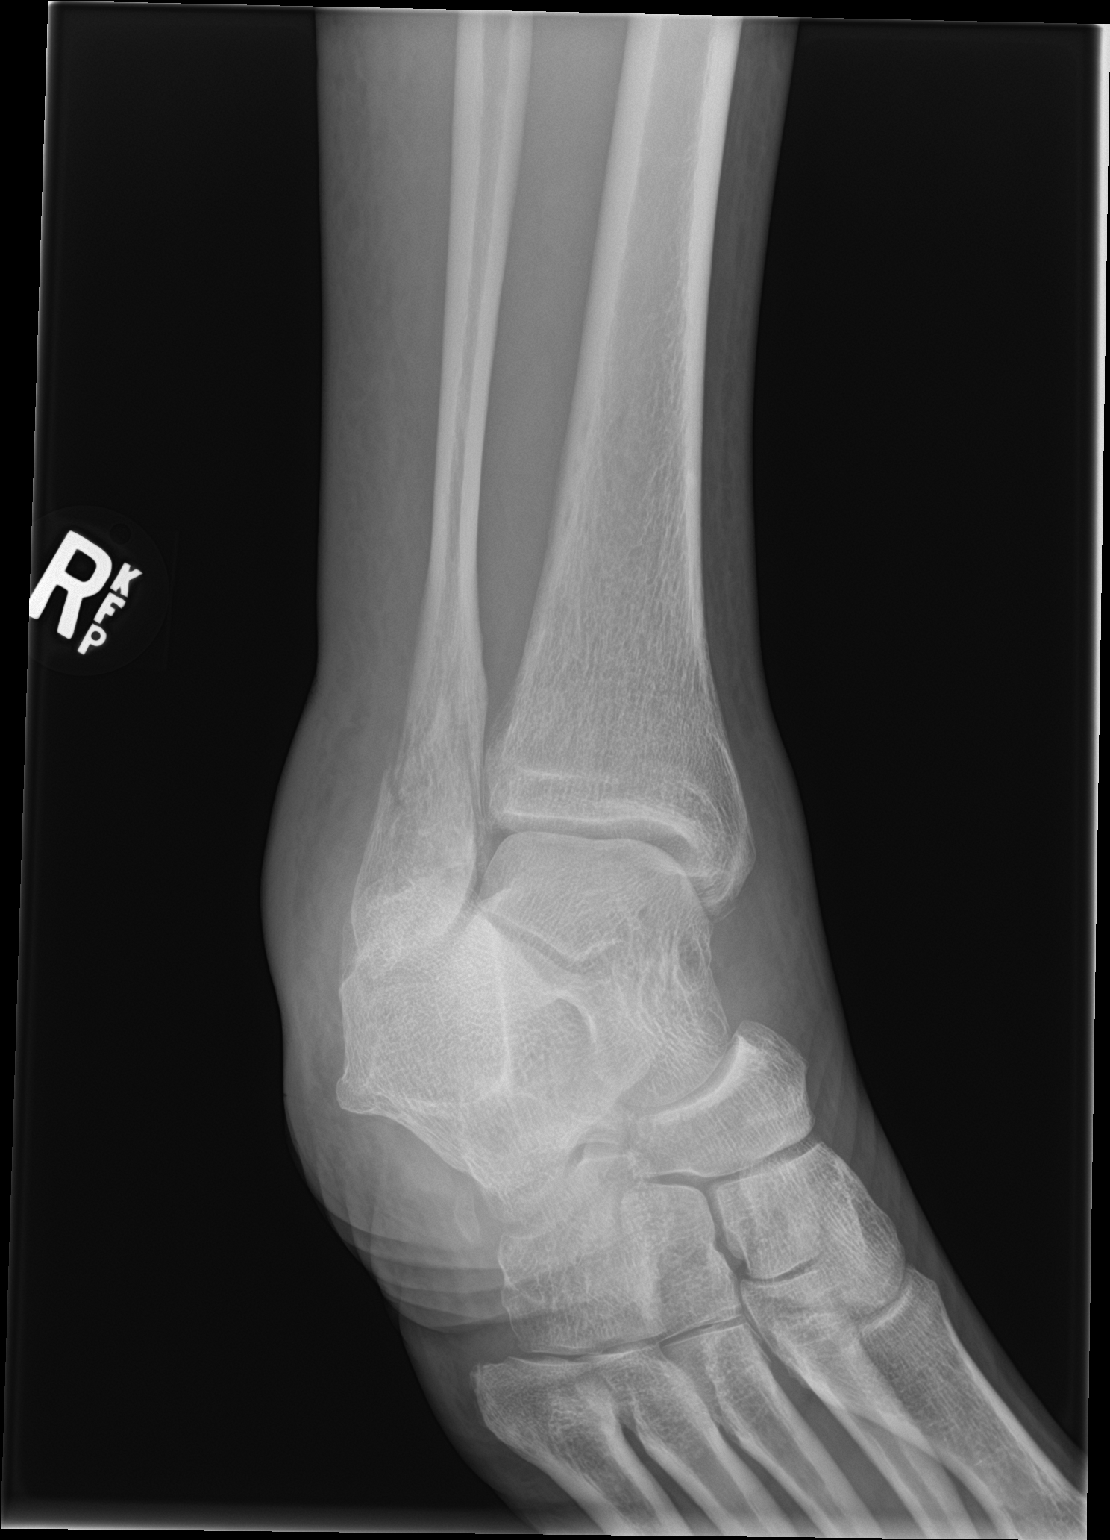

[ankle lat]
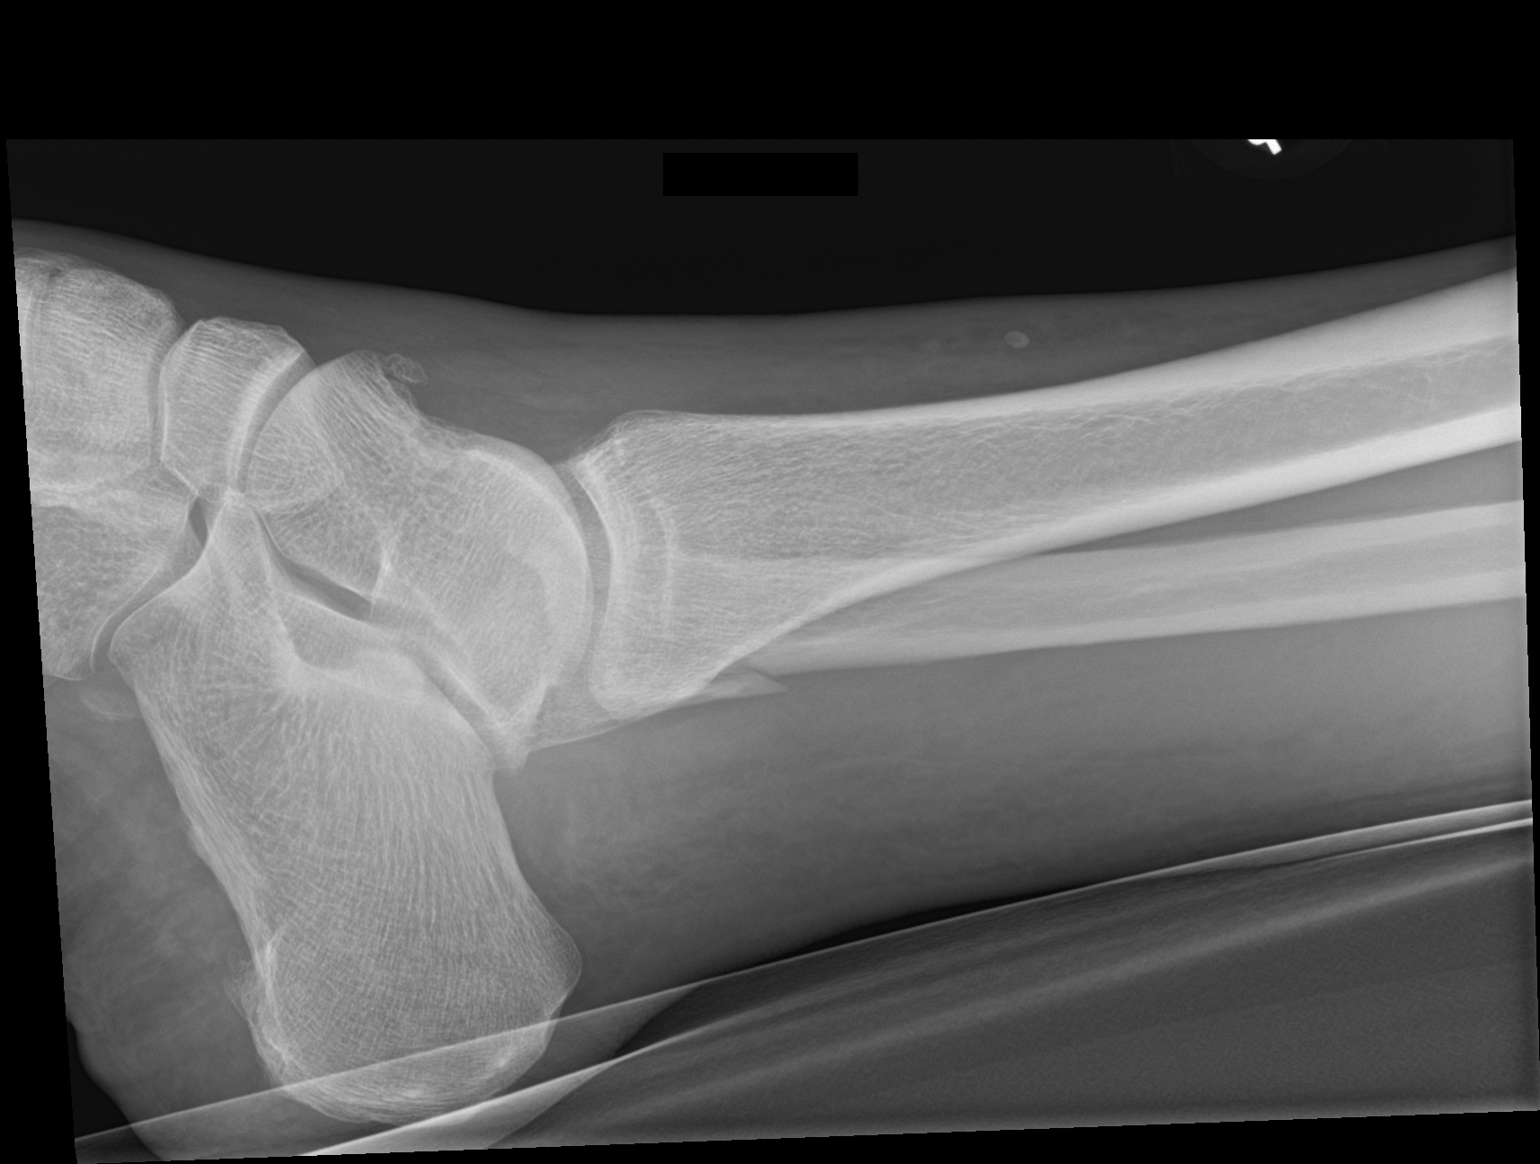

[3 of 3 positions shown; findings below may reference images not displayed]

FINDINGS: Oblique fracture of the distal fibular metaphysis without
significant displacement or angulation. Subtle linear lucency on the
oblique view involving the medial malleolus which may be
projectional versus a subtle nondisplaced fracture. Correlate with
point tenderness. No other fracture or dislocation. Soft tissue
swelling circumferentially around the ankle.
IMPRESSION: 1. Oblique fracture of the distal fibular metaphysis without
significant displacement or angulation with overlying soft tissue
swelling.
2. Subtle linear lucency on the oblique view involving the medial
malleolus which may be projectional versus a subtle nondisplaced
fracture.

## 2019-04-24 DIAGNOSIS — Z0000 Encounter for general adult medical examination without abnormal findings: Secondary | ICD-10-CM | POA: Diagnosis not present

## 2019-04-24 DIAGNOSIS — M1611 Unilateral primary osteoarthritis, right hip: Secondary | ICD-10-CM | POA: Diagnosis not present

## 2019-04-24 DIAGNOSIS — R001 Bradycardia, unspecified: Secondary | ICD-10-CM | POA: Diagnosis not present

## 2019-04-24 DIAGNOSIS — I10 Essential (primary) hypertension: Secondary | ICD-10-CM | POA: Diagnosis not present

## 2019-04-24 DIAGNOSIS — R000 Tachycardia, unspecified: Secondary | ICD-10-CM | POA: Diagnosis not present

## 2019-04-25 DIAGNOSIS — Z23 Encounter for immunization: Secondary | ICD-10-CM | POA: Diagnosis not present

## 2019-05-08 ENCOUNTER — Ambulatory Visit (INDEPENDENT_AMBULATORY_CARE_PROVIDER_SITE_OTHER): Payer: Medicare Other | Admitting: *Deleted

## 2019-05-08 ENCOUNTER — Telehealth: Payer: Self-pay | Admitting: *Deleted

## 2019-05-08 DIAGNOSIS — I495 Sick sinus syndrome: Secondary | ICD-10-CM

## 2019-05-08 LAB — CUP PACEART REMOTE DEVICE CHECK
Battery Voltage: 2.85 V
Brady Statistic AP VP Percent: 0.58 %
Brady Statistic AP VS Percent: 97.61 %
Brady Statistic AS VP Percent: 0.05 %
Brady Statistic AS VS Percent: 1.77 %
Brady Statistic RA Percent Paced: 98 %
Brady Statistic RV Percent Paced: 0.65 %
Date Time Interrogation Session: 20200708134439
Implantable Lead Implant Date: 20110429
Implantable Lead Implant Date: 20110429
Implantable Lead Location: 753859
Implantable Lead Location: 753860
Implantable Pulse Generator Implant Date: 20110429
Lead Channel Impedance Value: 344 Ohm
Lead Channel Impedance Value: 472 Ohm
Lead Channel Sensing Intrinsic Amplitude: 1.992 mV
Lead Channel Sensing Intrinsic Amplitude: 2.02 mV
Lead Channel Setting Pacing Amplitude: 2 V
Lead Channel Setting Pacing Amplitude: 3 V
Lead Channel Setting Pacing Pulse Width: 0.8 ms
Lead Channel Setting Sensing Sensitivity: 0.9 mV

## 2019-05-08 NOTE — Telephone Encounter (Signed)
Spoke with patient. Advised that per 7/8 scheduled remote transmission, PPM battery voltage 2.85V, ERI at 2.81V. Pt agreeable to monthly battery checks, next on 06/10/19. Advised pacing mode will revert to VVI @ 65bpm when ERI, discussed potential for fluid retention symptoms, palpitations, etc. Pt agrees to call our office for any symptoms between checks. He denies additional questions or concerns at this time and thanked me for my call.

## 2019-05-20 ENCOUNTER — Encounter: Payer: Self-pay | Admitting: Cardiology

## 2019-05-20 NOTE — Progress Notes (Signed)
Remote pacemaker transmission.   

## 2019-05-22 DIAGNOSIS — D696 Thrombocytopenia, unspecified: Secondary | ICD-10-CM | POA: Diagnosis not present

## 2019-05-22 DIAGNOSIS — R946 Abnormal results of thyroid function studies: Secondary | ICD-10-CM | POA: Diagnosis not present

## 2019-05-22 DIAGNOSIS — Z79899 Other long term (current) drug therapy: Secondary | ICD-10-CM | POA: Diagnosis not present

## 2019-05-22 DIAGNOSIS — R251 Tremor, unspecified: Secondary | ICD-10-CM | POA: Diagnosis not present

## 2019-05-22 DIAGNOSIS — E559 Vitamin D deficiency, unspecified: Secondary | ICD-10-CM | POA: Diagnosis not present

## 2019-05-22 DIAGNOSIS — E785 Hyperlipidemia, unspecified: Secondary | ICD-10-CM | POA: Diagnosis not present

## 2019-05-22 DIAGNOSIS — Z13220 Encounter for screening for lipoid disorders: Secondary | ICD-10-CM | POA: Diagnosis not present

## 2019-05-24 ENCOUNTER — Telehealth: Payer: Self-pay | Admitting: Cardiovascular Disease

## 2019-05-24 DIAGNOSIS — E782 Mixed hyperlipidemia: Secondary | ICD-10-CM | POA: Diagnosis not present

## 2019-05-24 DIAGNOSIS — Z950 Presence of cardiac pacemaker: Secondary | ICD-10-CM | POA: Diagnosis not present

## 2019-05-24 DIAGNOSIS — D696 Thrombocytopenia, unspecified: Secondary | ICD-10-CM | POA: Diagnosis not present

## 2019-05-24 DIAGNOSIS — R000 Tachycardia, unspecified: Secondary | ICD-10-CM | POA: Diagnosis not present

## 2019-05-24 DIAGNOSIS — I10 Essential (primary) hypertension: Secondary | ICD-10-CM | POA: Diagnosis not present

## 2019-05-24 NOTE — Telephone Encounter (Signed)
Patient's BP is running low, he has had to increase his salt intake, Dr. Rachell Cipro would like know if it's ok to stop patient his metoprolol tartrate (LOPRESSOR) 25 MG tablet. Dr. Ernie Hew can be reached at [phone]

## 2019-05-24 NOTE — Telephone Encounter (Signed)
Attempted to return call to # provided and it went to VM with no name-verification that it was Dr. Ernie Hew

## 2019-05-29 NOTE — Telephone Encounter (Signed)
Stop metoprolol

## 2019-05-29 NOTE — Telephone Encounter (Signed)
No answer at below number, voicemail does not identify as  Dr. Ernie Hew. Phoned patient who reports some BP fluctuation. 92/69 was his lowest recently and normal BP's run 115/67 range. Currently taking metoprolol 12.5 mg BID. He notices BP drops very soon after taking am and pm dose. He denies dizziness or nausea when BP's are low, but reports feeling low energy, "like being hungry."  pls advise, tx

## 2019-05-29 NOTE — Telephone Encounter (Signed)
Let's stop the metoprolol completely please.

## 2019-05-30 NOTE — Telephone Encounter (Signed)
Phoned patient, instructed to stop taking metoprolol and to call us if he has any further questions or concerns.  He verbalizes understanding.

## 2019-06-03 NOTE — Telephone Encounter (Signed)
Follow up   Dr. Ival Bible office is returning call. Please call.

## 2019-06-03 NOTE — Telephone Encounter (Signed)
Dr. Ival Bible office has been called and made aware that the Metoprolol was stopped 4 days ago and the patient was made aware.

## 2019-06-03 NOTE — Addendum Note (Signed)
Addended by: Ricci Barker on: 06/03/2019 03:06 PM   Modules accepted: Orders

## 2019-06-10 ENCOUNTER — Ambulatory Visit (INDEPENDENT_AMBULATORY_CARE_PROVIDER_SITE_OTHER): Payer: Medicare Other | Admitting: *Deleted

## 2019-06-10 DIAGNOSIS — I495 Sick sinus syndrome: Secondary | ICD-10-CM | POA: Diagnosis not present

## 2019-06-10 DIAGNOSIS — I471 Supraventricular tachycardia: Secondary | ICD-10-CM

## 2019-06-10 LAB — CUP PACEART REMOTE DEVICE CHECK
Battery Voltage: 2.83 V
Brady Statistic AP VP Percent: 5.91 %
Brady Statistic AP VS Percent: 89.13 %
Brady Statistic AS VP Percent: 1.1 %
Brady Statistic AS VS Percent: 3.87 %
Brady Statistic RA Percent Paced: 94.93 %
Brady Statistic RV Percent Paced: 7.1 %
Date Time Interrogation Session: 20200810163458
Implantable Lead Implant Date: 20110429
Implantable Lead Implant Date: 20110429
Implantable Lead Location: 753859
Implantable Lead Location: 753860
Implantable Pulse Generator Implant Date: 20110429
Lead Channel Impedance Value: 340 Ohm
Lead Channel Impedance Value: 432 Ohm
Lead Channel Sensing Intrinsic Amplitude: 1.862 mV
Lead Channel Setting Pacing Amplitude: 2 V
Lead Channel Setting Pacing Amplitude: 3 V
Lead Channel Setting Pacing Pulse Width: 0.8 ms
Lead Channel Setting Sensing Sensitivity: 0.9 mV

## 2019-06-17 DIAGNOSIS — I10 Essential (primary) hypertension: Secondary | ICD-10-CM | POA: Diagnosis not present

## 2019-06-17 DIAGNOSIS — I959 Hypotension, unspecified: Secondary | ICD-10-CM | POA: Diagnosis not present

## 2019-06-17 DIAGNOSIS — R001 Bradycardia, unspecified: Secondary | ICD-10-CM | POA: Diagnosis not present

## 2019-06-17 DIAGNOSIS — R000 Tachycardia, unspecified: Secondary | ICD-10-CM | POA: Diagnosis not present

## 2019-06-17 DIAGNOSIS — Z950 Presence of cardiac pacemaker: Secondary | ICD-10-CM | POA: Diagnosis not present

## 2019-06-18 NOTE — Progress Notes (Signed)
Remote pacemaker transmission.   

## 2019-07-10 DIAGNOSIS — Z23 Encounter for immunization: Secondary | ICD-10-CM | POA: Diagnosis not present

## 2019-07-11 ENCOUNTER — Ambulatory Visit (INDEPENDENT_AMBULATORY_CARE_PROVIDER_SITE_OTHER): Payer: Medicare Other | Admitting: *Deleted

## 2019-07-11 ENCOUNTER — Telehealth: Payer: Self-pay | Admitting: Emergency Medicine

## 2019-07-11 DIAGNOSIS — I4510 Unspecified right bundle-branch block: Secondary | ICD-10-CM

## 2019-07-11 DIAGNOSIS — I495 Sick sinus syndrome: Secondary | ICD-10-CM

## 2019-07-11 LAB — CUP PACEART REMOTE DEVICE CHECK
Battery Voltage: 2.83 V
Brady Statistic AP VP Percent: 3.99 %
Brady Statistic AP VS Percent: 87.05 %
Brady Statistic AS VP Percent: 0.87 %
Brady Statistic AS VS Percent: 8.09 %
Brady Statistic RA Percent Paced: 90.91 %
Brady Statistic RV Percent Paced: 4.98 %
Date Time Interrogation Session: 20200910125350
Implantable Lead Implant Date: 20110429
Implantable Lead Implant Date: 20110429
Implantable Lead Location: 753859
Implantable Lead Location: 753860
Implantable Pulse Generator Implant Date: 20110429
Lead Channel Impedance Value: 324 Ohm
Lead Channel Impedance Value: 456 Ohm
Lead Channel Sensing Intrinsic Amplitude: 2.035 mV
Lead Channel Sensing Intrinsic Amplitude: 2.356 mV
Lead Channel Setting Pacing Amplitude: 2 V
Lead Channel Setting Pacing Amplitude: 3 V
Lead Channel Setting Pacing Pulse Width: 0.8 ms
Lead Channel Setting Sensing Sensitivity: 0.9 mV

## 2019-07-11 NOTE — Telephone Encounter (Signed)
Made aware that will be having remote checks every month because ppm near ERI.

## 2019-07-22 NOTE — Addendum Note (Signed)
Addended by: Douglass Rivers D on: 07/22/2019 09:27 AM   Modules accepted: Level of Service

## 2019-07-22 NOTE — Progress Notes (Signed)
Remote pacemaker transmission.   

## 2019-08-12 ENCOUNTER — Ambulatory Visit (INDEPENDENT_AMBULATORY_CARE_PROVIDER_SITE_OTHER): Payer: Medicare Other | Admitting: *Deleted

## 2019-08-12 DIAGNOSIS — I495 Sick sinus syndrome: Secondary | ICD-10-CM

## 2019-08-12 LAB — CUP PACEART REMOTE DEVICE CHECK
Battery Voltage: 2.82 V
Brady Statistic AP VP Percent: 1.98 %
Brady Statistic AP VS Percent: 90.79 %
Brady Statistic AS VP Percent: 0.57 %
Brady Statistic AS VS Percent: 6.66 %
Brady Statistic RA Percent Paced: 92.28 %
Brady Statistic RV Percent Paced: 2.62 %
Date Time Interrogation Session: 20201012154936
Implantable Lead Implant Date: 20110429
Implantable Lead Implant Date: 20110429
Implantable Lead Location: 753859
Implantable Lead Location: 753860
Implantable Pulse Generator Implant Date: 20110429
Lead Channel Impedance Value: 348 Ohm
Lead Channel Impedance Value: 496 Ohm
Lead Channel Sensing Intrinsic Amplitude: 1.992 mV
Lead Channel Sensing Intrinsic Amplitude: 2.693 mV
Lead Channel Setting Pacing Amplitude: 2 V
Lead Channel Setting Pacing Amplitude: 3 V
Lead Channel Setting Pacing Pulse Width: 0.8 ms
Lead Channel Setting Sensing Sensitivity: 0.9 mV

## 2019-08-21 NOTE — Progress Notes (Signed)
Remote pacemaker transmission.   

## 2019-09-12 ENCOUNTER — Encounter: Payer: Medicare Other | Admitting: *Deleted

## 2019-10-14 ENCOUNTER — Ambulatory Visit (INDEPENDENT_AMBULATORY_CARE_PROVIDER_SITE_OTHER): Payer: Medicare Other | Admitting: *Deleted

## 2019-10-14 ENCOUNTER — Telehealth: Payer: Self-pay | Admitting: Cardiovascular Disease

## 2019-10-14 DIAGNOSIS — I495 Sick sinus syndrome: Secondary | ICD-10-CM

## 2019-10-14 DIAGNOSIS — Z01818 Encounter for other preprocedural examination: Secondary | ICD-10-CM

## 2019-10-14 LAB — CUP PACEART REMOTE DEVICE CHECK
Battery Voltage: 2.81 V
Brady Statistic AP VP Percent: 2.87 %
Brady Statistic AP VS Percent: 78.23 %
Brady Statistic AS VP Percent: 7.2 %
Brady Statistic AS VS Percent: 11.7 %
Brady Statistic RA Percent Paced: 80.65 %
Brady Statistic RV Percent Paced: 10.38 %
Date Time Interrogation Session: 20201214134204
Implantable Lead Implant Date: 20110429
Implantable Lead Implant Date: 20110429
Implantable Lead Location: 753859
Implantable Lead Location: 753860
Implantable Pulse Generator Implant Date: 20110429
Lead Channel Impedance Value: 348 Ohm
Lead Channel Impedance Value: 496 Ohm
Lead Channel Sensing Intrinsic Amplitude: 2.208 mV
Lead Channel Setting Pacing Amplitude: 3 V
Lead Channel Setting Pacing Pulse Width: 0.8 ms
Lead Channel Setting Sensing Sensitivity: 0.9 mV

## 2019-10-14 NOTE — Telephone Encounter (Signed)
  1. Has your device fired? no  2. Is you device beeping? no  3. Are you experiencing draining or swelling at device site? no  4. Are you calling to see if we received your device transmission? yes  5. Have you passed out? No   Patient calling stating he got a letter about missing his home remote pacer check on 09/12/19. He says he sent it in, but there may be something wrong with the machine.  Please route to [place]

## 2019-10-14 NOTE — Telephone Encounter (Signed)
Thanks, Raquel Sarna. Will try to go straight to procedure on Monday Dec 21.

## 2019-10-14 NOTE — Telephone Encounter (Signed)
Spoke with patient. Transmission received 10/14/19 shows ERI as of 10/10/19 with subsequent reversion to VVI at 65bpm. Pt unaware of mode/rate change, appears he conducts intrinsically at times per presenting rhythm. Advised patient to monitor for symptoms of fatigue, SOB, and fluid retention going forward, call if any noted. Able to move 12/10/19 f/u with Dr. Sallyanne Kuster to 11/11/19 at 08:40. Advised pt that message will be forwarded to Dr. Sallyanne Kuster and Lattie Haw, RN. Will advise if any new recommendations. Pt in agreement with plan, no further questions at this time.

## 2019-10-15 ENCOUNTER — Encounter: Payer: Self-pay | Admitting: *Deleted

## 2019-10-15 NOTE — Telephone Encounter (Signed)
The patient has been made aware and verbalized his understanding. The letter of instructions has been to [place].    [facility] at [address], [address]  [place], [place] [address]  Phone: [phone] Fax: [phone]    Generator Change Procedure Instructions  You are scheduled for a Generator Change (battery change) on  10/21/2019  with Dr. Sallyanne Kuster.  1. Please arrive at the [facility], Entrance "A"  at [facility] at 1:30 pm on the day of your procedure. (The address is [address])  2. DIET: Do not eat or drink after midnight the night before your procedure.  3. LABS: Your provider would like for you to return on 10/17/2019 to have the following labs drawn: CBC and BMET. You do not need an appointment for the lab. Once in our office lobby there is a podium where you can sign in and ring the doorbell to alert [place] that you are here. The lab is open from 8:00 am to 4:30 pm; closed for lunch from 12:45pm-1:45pm.  You will need to have the coronavirus test completed prior to your procedure. An appointment has been made at 2:15 on 10/17/2019. This is a Drive Up Visit at the [facility] [address] Please tell them that you are there for pre procedure testing. Someone will direct you to the appropriate testing line. Stay in your car and someone will be with you shortly. Please make sure to have all other labs completed before this test because you will need to stay quarantined until your procedure.  4. MEDICATIONS: Nothing to hold.  5.  Plan for an overnight stay.  Bring your insurance cards and a list of you medications.  6.  Wash your chest and neck with surgical scrub the evening before and the morning of your procedure.  Rinse well. Please review the surgical scrub instruction sheet given to you.   7. Your chest will need to be shaved prior to this procedure (if needed). We ask that you do this yourself at  home 1 to 2 days before or if uncomfortable/unable to do yourself, then it will be performed by the hospital staff the day of.  * Special note:  Every effort is made to have your procedure done on time.  Occasionally there are emergencies that present themselves at the hospital that may cause delays.  Please be patient if a delay does occur.                                                                                                           * If you have any questions after you get home, please call Lattie Haw, RN at [phone].    [facility] - Preparing For Surgery  Before surgery, you can play an important role. Because skin is not sterile, your skin needs to be as free of germs as possible. You can reduce the number of germs on your skin by washing with CHG (chlorahexidine gluconate) Soap before surgery.  CHG  is an antiseptic cleaner which kills germs and bonds with the skin to continue killing germs even after washing.   Please do not use if you have an allergy to CHG or antibacterial soaps.  If your skin becomes reddened/irritated stop using the CHG.   Do not shave (including legs and underarms) for at least 48 hours prior to first CHG shower.  It is OK to shave your face.  Please follow these instructions carefully:  1.  Shower the night before surgery and the morning of surgery with CHG.  2.  If you choose to wash your hair, wash your hair first as usual with your normal shampoo.  3.  After you shampoo, rinse your hair and body thoroughly to remove the shampoo.  4.  Use CHG as you would any other liquid soap.  You can apply CHG directly to the skin and wash gently with a clean washcloth. 5.  Apply the CHG Soap to your body ONLY FROM THE NECK DOWN.  Do not use on open wounds or open sores.  Avoid contact with your eyes, ears, mouth and genitals (private parts).  Wash genitals (private parts) with your normal soap.  6.  Wash thoroughly, paying special attention to the area where your  surgery will be performed.  7.  Thoroughly rise your body with warm water from the neck down.   8.  DO NOT shower/wash with your normal soap after using and rinsing off the CHG soap.  9.  Pat yourself dry with a clean towel.           10.  Wear clean pajamas.           11.  Place clean sheets on your bed the night of your first shower and do not sleep with pets.  Day of Surgery: Do not apply any deodorants/lotions.  Please wear clean clothes to the hospital/surgery center.

## 2019-10-17 ENCOUNTER — Other Ambulatory Visit (HOSPITAL_COMMUNITY)
Admission: RE | Admit: 2019-10-17 | Discharge: 2019-10-17 | Disposition: A | Discharge status: Home / Self Care | Payer: Medicare Other | Source: Ambulatory Visit | Attending: Cardiovascular Disease | Admitting: Cardiovascular Disease

## 2019-10-17 DIAGNOSIS — Z01812 Encounter for preprocedural laboratory examination: Secondary | ICD-10-CM | POA: Insufficient documentation

## 2019-10-17 DIAGNOSIS — Z20828 Contact with and (suspected) exposure to other viral communicable diseases: Secondary | ICD-10-CM | POA: Diagnosis not present

## 2019-10-18 LAB — CBC
Hematocrit: 40.6 % (ref 37.5–51.0)
Hemoglobin: 13.7 g/dL (ref 13.0–17.7)
MCH: 30.4 pg (ref 26.6–33.0)
MCHC: 33.7 g/dL (ref 31.5–35.7)
MCV: 90 fL (ref 79–97)
Platelets: 150 10*3/uL (ref 150–450)
RBC: 4.5 x10E6/uL (ref 4.14–5.80)
RDW: 13.5 % (ref 11.6–15.4)
WBC: 4.4 10*3/uL (ref 3.4–10.8)

## 2019-10-18 LAB — NOVEL CORONAVIRUS, NAA (HOSP ORDER, SEND-OUT TO REF LAB; TAT 18-24 HRS): SARS-CoV-2, NAA: NOT DETECTED

## 2019-10-18 LAB — BASIC METABOLIC PANEL
BUN/Creatinine Ratio: 14 (ref 10–24)
BUN: 15 mg/dL (ref 8–27)
CO2: 24 mmol/L (ref 20–29)
Calcium: 9.3 mg/dL (ref 8.6–10.2)
Chloride: 107 mmol/L — ABNORMAL HIGH (ref 96–106)
Creatinine, Ser: 1.11 mg/dL (ref 0.76–1.27)
GFR calc Af Amer: 70 mL/min/{1.73_m2} (ref >59)
GFR calc non Af Amer: 61 mL/min/{1.73_m2} (ref >59)
Glucose: 73 mg/dL (ref 65–99)
Potassium: 4.3 mmol/L (ref 3.5–5.2)
Sodium: 145 mmol/L — ABNORMAL HIGH (ref 134–144)

## 2019-10-19 ENCOUNTER — Other Ambulatory Visit: Payer: Self-pay | Admitting: *Deleted

## 2019-10-19 DIAGNOSIS — I495 Sick sinus syndrome: Secondary | ICD-10-CM

## 2019-10-21 ENCOUNTER — Encounter (HOSPITAL_COMMUNITY): Payer: Self-pay | Admitting: Cardiovascular Disease

## 2019-10-21 ENCOUNTER — Ambulatory Visit (HOSPITAL_COMMUNITY)
Admission: RE | Admit: 2019-10-21 | Discharge: 2019-10-22 | Disposition: A | Discharge status: Home / Self Care | Payer: Medicare Other | Attending: Cardiovascular Disease | Admitting: Cardiovascular Disease

## 2019-10-21 ENCOUNTER — Encounter (HOSPITAL_COMMUNITY)
Admission: RE | Disposition: A | Discharge status: Home / Self Care | Payer: Medicare Other | Source: Home / Self Care | Attending: Cardiovascular Disease

## 2019-10-21 ENCOUNTER — Other Ambulatory Visit: Payer: Self-pay

## 2019-10-21 DIAGNOSIS — Z79899 Other long term (current) drug therapy: Secondary | ICD-10-CM | POA: Diagnosis not present

## 2019-10-21 DIAGNOSIS — I471 Supraventricular tachycardia: Secondary | ICD-10-CM | POA: Diagnosis not present

## 2019-10-21 DIAGNOSIS — I2510 Atherosclerotic heart disease of native coronary artery without angina pectoris: Secondary | ICD-10-CM | POA: Diagnosis not present

## 2019-10-21 DIAGNOSIS — I495 Sick sinus syndrome: Secondary | ICD-10-CM | POA: Insufficient documentation

## 2019-10-21 DIAGNOSIS — I10 Essential (primary) hypertension: Secondary | ICD-10-CM | POA: Diagnosis not present

## 2019-10-21 DIAGNOSIS — Z45010 Encounter for checking and testing of cardiac pacemaker pulse generator [battery]: Secondary | ICD-10-CM | POA: Diagnosis present

## 2019-10-21 DIAGNOSIS — T82110D Breakdown (mechanical) of cardiac electrode, subsequent encounter: Secondary | ICD-10-CM | POA: Diagnosis not present

## 2019-10-21 DIAGNOSIS — T82110A Breakdown (mechanical) of cardiac electrode, initial encounter: Secondary | ICD-10-CM

## 2019-10-21 DIAGNOSIS — Z950 Presence of cardiac pacemaker: Secondary | ICD-10-CM | POA: Diagnosis present

## 2019-10-21 DIAGNOSIS — G629 Polyneuropathy, unspecified: Secondary | ICD-10-CM | POA: Insufficient documentation

## 2019-10-21 DIAGNOSIS — Z7982 Long term (current) use of aspirin: Secondary | ICD-10-CM | POA: Diagnosis not present

## 2019-10-21 DIAGNOSIS — Z8249 Family history of ischemic heart disease and other diseases of the circulatory system: Secondary | ICD-10-CM | POA: Diagnosis not present

## 2019-10-21 HISTORY — PX: PPM GENERATOR CHANGEOUT: EP1233

## 2019-10-21 HISTORY — PX: LEAD REVISION/REPAIR: EP1213

## 2019-10-21 SURGERY — PPM GENERATOR CHANGEOUT

## 2019-10-21 MED ORDER — FENTANYL CITRATE (PF) 100 MCG/2ML IJ SOLN
INTRAMUSCULAR | Status: DC | PRN
  Administered 2019-10-21: 25 ug via INTRAVENOUS

## 2019-10-21 MED ORDER — MIDAZOLAM HCL 5 MG/5ML IJ SOLN
INTRAMUSCULAR | Status: DC | PRN
  Administered 2019-10-21: 1 mg via INTRAVENOUS

## 2019-10-21 MED ORDER — CEFAZOLIN SODIUM-DEXTROSE 2-4 GM/100ML-% IV SOLN
INTRAVENOUS | Status: AC
  Filled 2019-10-21: qty 100

## 2019-10-21 MED ORDER — LIDOCAINE HCL (PF) 1 % IJ SOLN
INTRAMUSCULAR | Status: DC | PRN
  Administered 2019-10-21: 60 mL

## 2019-10-21 MED ORDER — SODIUM CHLORIDE 0.9 % IV SOLN
80.0000 mg | INTRAVENOUS | Status: AC
  Administered 2019-10-21: 80 mg

## 2019-10-21 MED ORDER — SODIUM CHLORIDE 0.9 % IV SOLN
INTRAVENOUS | Status: DC | PRN
  Administered 2019-10-21 – 2019-10-22 (×2): 1000 mL via INTRAVENOUS

## 2019-10-21 MED ORDER — CEFAZOLIN SODIUM-DEXTROSE 2-4 GM/100ML-% IV SOLN
2.0000 g | INTRAVENOUS | Status: AC
  Administered 2019-10-21: 2 g via INTRAVENOUS

## 2019-10-21 MED ORDER — ONDANSETRON HCL 4 MG/2ML IJ SOLN: 4.0000 mg | Freq: Four times a day (QID) | INTRAMUSCULAR | Status: DC | PRN

## 2019-10-21 MED ORDER — CEFAZOLIN SODIUM-DEXTROSE 1-4 GM/50ML-% IV SOLN
1.0000 g | Freq: Four times a day (QID) | INTRAVENOUS | Status: AC
  Administered 2019-10-21 – 2019-10-22 (×3): 1 g via INTRAVENOUS
  Filled 2019-10-21 (×5): qty 50

## 2019-10-21 MED ORDER — SODIUM CHLORIDE 0.9 % IV SOLN
INTRAVENOUS | Status: AC
  Filled 2019-10-21: qty 2

## 2019-10-21 MED ORDER — LORATADINE 10 MG PO TABS
10.0000 mg | ORAL_TABLET | Freq: Every day | ORAL | Status: DC
  Administered 2019-10-21 – 2019-10-22 (×2): 10 mg via ORAL
  Filled 2019-10-21 (×2): qty 1

## 2019-10-21 MED ORDER — ACETAMINOPHEN 325 MG PO TABS: 325.0000 mg | ORAL_TABLET | ORAL | Status: DC | PRN

## 2019-10-21 MED ORDER — HEPARIN (PORCINE) IN NACL 1000-0.9 UT/500ML-% IV SOLN
INTRAVENOUS | Status: AC
  Filled 2019-10-21: qty 500

## 2019-10-21 MED ORDER — SODIUM CHLORIDE 0.9 % IV SOLN
INTRAVENOUS | Status: DC
  Administered 2019-10-21: 250 mL via INTRAVENOUS

## 2019-10-21 MED ORDER — CHLORHEXIDINE GLUCONATE 4 % EX LIQD
4.0000 "application " | Freq: Once | CUTANEOUS | Status: DC
  Filled 2019-10-21: qty 60

## 2019-10-21 MED ORDER — HEPARIN (PORCINE) IN NACL 1000-0.9 UT/500ML-% IV SOLN
INTRAVENOUS | Status: DC | PRN
  Administered 2019-10-21: 500 mL

## 2019-10-21 MED ORDER — MIDAZOLAM HCL 5 MG/5ML IJ SOLN
INTRAMUSCULAR | Status: AC
  Filled 2019-10-21: qty 5

## 2019-10-21 MED ORDER — FENTANYL CITRATE (PF) 100 MCG/2ML IJ SOLN
INTRAMUSCULAR | Status: AC
  Filled 2019-10-21: qty 2

## 2019-10-21 MED ORDER — IOHEXOL 350 MG/ML SOLN
INTRAVENOUS | Status: DC | PRN
  Administered 2019-10-21: 15 mL

## 2019-10-21 MED ORDER — LIDOCAINE HCL (PF) 1 % IJ SOLN
INTRAMUSCULAR | Status: AC
  Filled 2019-10-21: qty 60

## 2019-10-21 SURGICAL SUPPLY — 10 items
CABLE SURGICAL S-101-97-12 (CABLE) ×3 IMPLANT
IPG PACE AZUR XT DR MRI W1DR01 (Pacemaker) ×1 IMPLANT
KIT LEAD END CAP (Cap) ×1 IMPLANT
LEAD CAPSURE NOVUS 5076-58CM (Lead) ×3 IMPLANT
LEAD END CAP (Cap) ×2 IMPLANT
PACE AZURE XT DR MRI W1DR01 (Pacemaker) ×3 IMPLANT
PAD PRO RADIOLUCENT 2001M-C (PAD) ×3 IMPLANT
SHEATH 7FR PRELUDE SNAP 13 (SHEATH) ×3 IMPLANT
SHEATH CLASSIC 7F 25CM (SHEATH) ×3 IMPLANT
TRAY PACEMAKER INSERTION (PACKS) ×3 IMPLANT

## 2019-10-21 NOTE — H&P (Signed)
Cardiology Admission History and Physical:   Patient ID: David Booth MRN: [id number]; DOB: [birth year]   Admission date: 10/21/2019  Primary Care Provider: [name], MD Primary Cardiologist: Kiyani Jernigan Primary Electrophysiologist:  None   Chief Complaint:  Pacemaker generator at ERI  Patient Profile:   David Booth is a 85 y.o. male with history of tachycardia-bradycardia syndrome, paroxysmal atrial tachycardia, chronic right bundle branch block and long PR, history of vasovagal syncope, dual-chamber permanent pacemaker at RRT, here for pacemaker generator change out.  His chronic right ventricular lead has poor sensing and capture threshold parameters and he is due to have an RV lead revision.  History of Present Illness:   David Booth initially received a pacemaker in 2011 from Dr. Elisabeth Cara.  Over the years there has been steady decline in sensed R waves and worsening RV pacing threshold.  This has not yet interfered with device function and he does not require right ventricular pacing.  His pacemaker has reached ERI and he is here today for implantation of a new generator and a new right ventricular lead.  The patient specifically denies any chest pain at rest exertion, dyspnea at rest or with exertion, orthopnea, paroxysmal nocturnal dyspnea, syncope, palpitations, focal neurological deficits, intermittent claudication, lower extremity edema, unexplained weight gain, cough, hemoptysis or wheezing.   Heart Pathway Score:     Past Medical History:  Diagnosis Date  . Coronary artery disease   . Hypertension   . Neuropathy   . Numbness in feet   . Pacemaker     Past Surgical History:  Procedure Laterality Date  . pace maker       Medications Prior to Admission: Prior to Admission medications   Medication Sig Start Date End Date Taking? Authorizing Provider  Alpha-Lipoic Acid 100 MG TABS Take 100 mg by mouth 3 (three) times daily.    Yes [provider]  aspirin  81 MG tablet Take 81 mg by mouth daily.     Yes [provider]  b complex vitamins tablet Take 1 tablet by mouth daily.   Yes [provider]  Cholecalciferol (VITAMIN D3) 2000 UNITS TABS Take 2,000 Units by mouth daily.    Yes [provider]  Coenzyme Q10 (CO Q 10) 100 MG CAPS Take 100 mg by mouth daily.    Yes [provider]  EPINEPHrine (EPI-PEN) 0.3 mg/0.3 mL DEVI Inject 0.3 mg into the muscle as needed for anaphylaxis.    Yes [provider]  Garlic 123XX123 MG CAPS Take 1,000 mg by mouth daily.   Yes [provider]  Glucosamine-Chondroit-Vit C-Mn (GLUCOSAMINE CHONDR 1500 COMPLX) CAPS Take 1 capsule by mouth 2 (two) times daily.   Yes [provider]  loratadine (CLARITIN) 10 MG tablet Take 10 mg by mouth daily.   Yes [provider]  Lycopene 10 MG CAPS Take 10 mg by mouth daily.    Yes [provider]  MAGNESIUM PO Take 1 Dose by mouth 2 (two) times daily. Powder form   Yes [provider]  Melatonin 10 MG CAPS Take 10 mg by mouth at bedtime.    Yes [provider]  Misc Natural Products (PROSTATE HEALTH) CAPS Take 1 capsule by mouth 3 (three) times daily.   Yes [provider]  Multiple Vitamin (MULTIVITAMIN) tablet Take 1 tablet by mouth daily.   Yes [provider]  Omega-3 Fatty Acids (FISH OIL) 1200 MG CAPS Take 1,200 mg by mouth daily.  Yes [provider]  vitamin C (ASCORBIC ACID) 250 MG tablet Take 250 mg by mouth 3 (three) times daily.    Yes [provider]  vitamin E (VITAMIN E) 400 UNIT capsule Take 400 Units by mouth 3 (three) times daily.    Yes [provider]     Allergies:    Allergies  Allergen Reactions  . Bee Venom     swelling    Social History:   Social History   Socioeconomic History  . Marital status: Divorced    Spouse name: Not on file  . Number of children: 2  . Years of education: college  . Highest  education level: Not on file  Occupational History    Comment: retired  Tobacco Use  . Smoking status: Never Smoker  . Smokeless tobacco: Never Used  Substance and Sexual Activity  . Alcohol use: No    Alcohol/week: 0.0 standard drinks  . Drug use: No  . Sexual activity: Not on file  Other Topics Concern  . Not on file  Social History Narrative   Patient lives at home alone with his girlfriend.   Retired.    Education. College education.   Right handed.   Caffeine None.   Social Determinants of Health   Financial Resource Strain:   . Difficulty of Paying Living Expenses: Not on file  Food Insecurity:   . Worried About Charity fundraiser in the Last Year: Not on file  . Ran Out of Food in the Last Year: Not on file  Transportation Needs:   . Lack of Transportation (Medical): Not on file  . Lack of Transportation (Non-Medical): Not on file  Physical Activity:   . Days of Exercise per Week: Not on file  . Minutes of Exercise per Session: Not on file  Stress:   . Feeling of Stress : Not on file  Social Connections:   . Frequency of Communication with Friends and Family: Not on file  . Frequency of Social Gatherings with Friends and Family: Not on file  . Attends Religious Services: Not on file  . Active Member of Clubs or Organizations: Not on file  . Attends Archivist Meetings: Not on file  . Marital Status: Not on file  Intimate Partner Violence:   . Fear of Current or Ex-Partner: Not on file  . Emotionally Abused: Not on file  . Physically Abused: Not on file  . Sexually Abused: Not on file    Family History:   The patient's family history includes Alzheimer's disease in his mother; Heart attack in his father.    ROS:  Please see the history of present illness.  All other ROS reviewed and negative.     Physical Exam/Data:   Vitals:   10/21/19 1325  BP: (!) 162/69  Pulse: 68  Resp: 16  Temp: 98.4 F (36.9 C)  SpO2: 100%  Weight: 68 kg    Height: 5\' 9"  (1.753 m)   No intake or output data in the 24 hours ending 10/21/19 1441 Last 3 Weights 10/21/2019 07/04/2018 04/03/2018  Weight (lbs) 150 lb 160 lb 155 lb  Weight (kg) 68.04 kg 72.576 kg 70.308 kg     Body mass index is 22.15 kg/m.  General:  Well nourished, well developed, in no acute distress.  Healthy pacemaker surgical site. HEENT: normal Lymph: no adenopathy Neck: no JVD Endocrine:  No thryomegaly Vascular: No carotid bruits; FA pulses 2+ bilaterally without bruits  Cardiac:  normal  S1, widely split S2; RRR; no murmur  Lungs:  clear to auscultation bilaterally, no wheezing, rhonchi or rales  Abd: soft, nontender, no hepatomegaly  Ext: no edema Musculoskeletal:  No deformities, BUE and BLE strength normal and equal Skin: warm and dry  Neuro:  CNs 2-12 intact, no focal abnormalities noted Psych:  Normal affect    EKG:  The ECG that was done 07/04/2018 was personally reviewed and demonstrates atrial paced, ventricular sensed rhythm, long AV delay, right bundle branch block  Relevant CV Studies:   Laboratory Data:  High Sensitivity Troponin:  No results for input(s): TROPONINIHS in the last 720 hours.    Chemistry Recent Labs  Lab 10/17/19 1408  NA 145*  K 4.3  CL 107*  CO2 24  GLUCOSE 73  BUN 15  CREATININE 1.11  CALCIUM 9.3  GFRNONAA 61  GFRAA 70    No results for input(s): PROT, ALBUMIN, AST, ALT, ALKPHOS, BILITOT in the last 168 hours. Hematology Recent Labs  Lab 10/17/19 1408  WBC 4.4  RBC 4.50  HGB 13.7  HCT 40.6  MCV 90  MCH 30.4  MCHC 33.7  RDW 13.5  PLT 150   BNPNo results for input(s): BNP, PROBNP in the last 168 hours.  DDimer No results for input(s): DDIMER in the last 168 hours.   Radiology/Studies:  No results found. {   Assessment and Plan:   1. Pacemaker battery depletion: For generator change out today. 2. RV lead dysfunction: To place a new right ventricular lead.  We will check a venogram before we start the  procedure. 3. Tachycardia-bradycardia syndrome/paroxysmal atrial tachycardia and SSS: Also has a history of vasovagal syncope.  Has not had rhythmic or vagal syncope since device implantation.  This procedure has been fully reviewed with the patient and written informed consent has been obtained.   For questions or updates, please contact [place] Please consult [email or web address] for contact info under        Signed, Sanda Klein, MD  10/21/2019 2:41 PM

## 2019-10-21 NOTE — Op Note (Signed)
Procedure report  Procedure performed:  1. Implantation of new right ventricular lead and generator changeout of dual chamber permanent pacemaker  2. Left upper extremity venogram  3. Light sedation    Reason for procedure:  Symptomatic bradycardia due to: Sinus node dysfunction Tachycardia-bradycardia syndrome RV lead malfunction (poor sensing and worsening capture threshold)  Procedure performed by: Sanda Klein, MD  Complications: None  Estimated blood loss: <10 mL  Medications administered during procedure: Ancef 2 g intravenously Vancomycin 1 g intravenously Lidocaine 1% 30 mL locally,  Fentanyl 25 mcg intravenously Versed 1 mg intravenously 20 mL Omnipaque  During this procedure the patient is administered a total of Versed 1 mg and Fentanyl 25 mcg IV to achieve and maintain moderate conscious sedation.  The patient's heart rate, blood pressure, and oxygen saturation are monitored continuously during the procedure. The period of conscious sedation is 68 minutes, of which I was present face-to-face 100% of this time.   Device details: Generator Medtronic [place] DR model A3080252 serial number [id number] Right atrial lead Medtronic 5086 serial number [id number] (02/06/2010) Right ventricular lead (new) Medtronic [id number] serial number [id number]  Abandoned (capped) chronic RV lead Medtronic 5086, [id number]  Procedure details:  After the risks and benefits of the procedure were discussed the patient provided informed consent and was brought to the cardiac cath lab in the fasting state. The patient was prepped and draped in usual sterile fashion. Local anesthesia with 1% lidocaine was administered to to the left infraclavicular area. A 5-6 cm horizontal incision was made parallel with and 2-3 cm caudal to the left clavicle, immediately underneath the previous scar. Using electrocautery and blunt dissection the prepectoral pocket was created down to the level of  the device, which was easily explanted. The pocket was carefully inspected for hemostasis.  A venogram was performed and showed that the left subclavian vein was widely patent to the SVC junction.  Under fluoroscopic guidance and using the modified Seldinger technique 2 separate venipunctures were performed to access the left subclavian vein. Some difficulty was encountered accessing the vein.  A J-tip guidewires were subsequently exchanged for 7 French safe sheaths. The new RV pacing lead would not cross the brachiocephalic-SVC junction, so the sheath was exchanged over the wire for a long 59F sheath, after which the pacing lead was advanced without difficulty.  Under fluoroscopic guidance the ventricular lead was advanced to level of the mid to apical right ventricular septum and the active-fixation helix was deployed. Prominent current of injury was seen. Satisfactory pacing threshold was found, but sensing was variable. The helix was retracted and the lead was repositioned. Good pacing and sensing parameters were recorded. There was no evidence of diaphragmatic stimulation at maximum device output. The safe sheath was peeled away and the lead was secured in place with 2-0 silk.  The pocket was flushed with copious amounts of antibiotic solution. Reinspection showed excellent hemostasis..  The ventricular lead was connected to the generator and appropriate ventricular pacing was seen. Subsequently the old atrial lead was also connected. Repeat testing of the lead parameters via telemetry showed excellent values. The chronic RV lead was capped.  The entire system was then carefully inserted in the pocket with care been taking that the leads and device assumed a comfortable position without pressure on the incision. Great care was taken that the leads be located deep to the generator. The pocket was then closed in layers using 2 layers of 2-0 Vicryl, one layer of 3-0 Vicryl and  Steristrips, after which a  sterile dressing was applied.  At the end of the procedure the following lead parameters were encountered:  Right atrial lead  sensed P waves 2.8 mV, impedance 437 ohms, threshold 0.75 V at 0.4 ms pulse width.  Right ventricular lead sensed R waves 6.3 mV, impedance 627 ohms, threshold 0.75 V at 0.4 ms pulse width.   Sanda Klein, MD, [facility] [phone] office [phone] pager

## 2019-10-22 ENCOUNTER — Ambulatory Visit (HOSPITAL_COMMUNITY): Payer: Medicare Other

## 2019-10-22 DIAGNOSIS — Z45010 Encounter for checking and testing of cardiac pacemaker pulse generator [battery]: Secondary | ICD-10-CM | POA: Diagnosis not present

## 2019-10-22 DIAGNOSIS — I2510 Atherosclerotic heart disease of native coronary artery without angina pectoris: Secondary | ICD-10-CM | POA: Diagnosis not present

## 2019-10-22 DIAGNOSIS — I10 Essential (primary) hypertension: Secondary | ICD-10-CM | POA: Diagnosis not present

## 2019-10-22 DIAGNOSIS — I495 Sick sinus syndrome: Secondary | ICD-10-CM | POA: Diagnosis not present

## 2019-10-22 NOTE — Discharge Summary (Signed)
Discharge Summary    Patient ID: David Booth,  MRN: [id number], DOB/AGE: 12-04-1932 85 y.o.  Admit date: 10/21/2019 Discharge date: 10/22/2019  Primary Care Provider: [name] Primary Cardiologist: Sanda Klein, MD  Discharge Diagnoses    Principal Problem:   Pacemaker battery depletion Active Problems:   SSS (sick sinus syndrome) [facility])   Pacemaker - Medtronic Revo, dual chamber, MRI-conditional, implanted April 2011   Pacemaker lead malfunction   Allergies Allergies  Allergen Reactions   Bee Venom     swelling    Diagnostic Studies/Procedures    PPM Generator changeout 10/21/2019: 1. Implantation of new right ventricular lead and generator changeout of dual chamber permanent pacemaker  2. Left upper extremity venogram  3. Light sedation  Device details: Generator Medtronic [place] XT DR model A3080252 serial number [id number] H Right atrial lead Medtronic [id number] serial number [id number] (02/06/2010) Right ventricular lead (new) Medtronic [id number] serial number [id number]  Abandoned (capped) chronic RV lead Medtronic 5086, [id number]  _____________   History of Present Illness     David Booth is a 85 y.o. male with history of tachycardia-bradycardia syndrome, paroxysmal atrial tachycardia, chronic right bundle branch block and long PR, history of vasovagal syncope, dual-chamber permanent pacemaker at [facility], here for pacemaker generator change out.  His chronic right ventricular lead has poor sensing and capture threshold parameters and he is due to have an RV lead revision.  David Booth initially received a pacemaker in 2011 from Dr. Elisabeth Cara.  Over the years there has been steady decline in sensed R waves and worsening RV pacing threshold.  This has not yet interfered with device function and he does not require right ventricular pacing.  His pacemaker has reached ERI and he is here today for implantation of a new generator and a new right ventricular  lead.  The patient specifically denies any chest pain at rest exertion, dyspnea at rest or with exertion, orthopnea, paroxysmal nocturnal dyspnea, syncope, palpitations, focal neurological deficits, intermittent claudication, lower extremity edema, unexplained weight gain, cough, hemoptysis or wheezing.  Hospital Course     Consultants: None   Pacemaker battery depletion: patient presented for generator change out 10/21/2019. Procedure went well without complications. Post-op CXR without PTX with pacer lead tips over the RA/RV. Surgical site checked by Dr. Sallyanne Kuster on the day of discharge with plans for wound check in 2 weeks. He will follow-up with Dr. Loletha Grayer in 3 months. Deemed stable for discharge home 10/22/2019. _____________  Discharge Vitals Blood pressure 110/62, pulse 61, temperature 98 F (36.7 C), temperature source Oral, resp. rate 20, height 5\' 9"  (1.753 m), weight 74.7 kg, SpO2 96 %.  Filed Weights   10/21/19 1325 10/22/19 0352  Weight: 68 kg 74.7 kg    Labs & Radiologic Studies    CBC No results for input(s): WBC, NEUTROABS, HGB, HCT, MCV, PLT in the last 72 hours. Basic Metabolic Panel No results for input(s): NA, K, CL, CO2, GLUCOSE, BUN, CREATININE, CALCIUM, MG, PHOS in the last 72 hours. Liver Function Tests No results for input(s): AST, ALT, ALKPHOS, BILITOT, PROT, ALBUMIN in the last 72 hours. No results for input(s): LIPASE, AMYLASE in the last 72 hours. Cardiac Enzymes No results for input(s): CKTOTAL, CKMB, CKMBINDEX, TROPONINI in the last 72 hours. BNP Invalid input(s): POCBNP D-Dimer No results for input(s): DDIMER in the last 72 hours. Hemoglobin A1C No results for input(s): HGBA1C in the last 72 hours. Fasting Lipid Panel No results for input(s): CHOL, HDL,  LDLCALC, TRIG, CHOLHDL, LDLDIRECT in the last 72 hours. Thyroid Function Tests No results for input(s): TSH, T4TOTAL, T3FREE, THYROIDAB in the last 72 hours.  Invalid input(s): FREET3 _____________   DG Chest 2 View  Result Date: 10/22/2019 CLINICAL DATA:  Pacemaker insertion. EXAM: CHEST - 2 VIEW COMPARISON:  Chest x-ray 12/30/2017. FINDINGS: Cardiac pacer with lead tips over the right atrium right ventricle. Mild cardiomegaly. No pulmonary venous congestion. Low lung volumes. Small left pleural effusion cannot be excluded. No pneumothorax. IMPRESSION: 1. Cardiac pacer with lead tips over the right atrium and right ventricle. No pneumothorax. Mild cardiomegaly. No pulmonary venous congestion. 2. Low lung volumes. Small left pleural effusion cannot be excluded. Electronically Signed   By: Marcello Moores  Register   On: 10/22/2019 08:03   EP PPM/ICD IMPLANT  Result Date: 10/21/2019 1. Implantation of new right ventricular lead and generator changeout of dual chamber permanent pacemaker 2. Left upper extremity venogram 3. Light sedation Reason for procedure: Symptomatic bradycardia due to: Sinus node dysfunction Tachycardia-bradycardia syndrome RV lead malfunction (poor sensing and worsening capture threshold) Procedure performed by: Sanda Klein, MD Complications: None Estimated blood loss: <10 mL Medications administered during procedure: Ancef 2 g intravenously Vancomycin 1 g intravenously Lidocaine 1% 30 mL locally, Fentanyl 25 mcg intravenously Versed 1 mg intravenously 20 mL Omnipaque During this procedure the patient is administered a total of Versed 1 mg and Fentanyl 25 mcg IV to achieve and maintain moderate conscious sedation.  The patient's heart rate, blood pressure, and oxygen saturation are monitored continuously during the procedure. The period of conscious sedation is 68 minutes, of which I was present face-to-face 100% of this time. Device details: Generator Medtronic [place] DR model [id number] serial number [id number] H Right atrial lead Medtronic 5086 serial number [id number] (02/06/2010) Right ventricular lead (new) Medtronic [id number] serial number [id number] Abandoned (capped) chronic RV lead  Medtronic 5086, [id number] Procedure details: After the risks and benefits of the procedure were discussed the patient provided informed consent and was brought to the cardiac cath lab in the fasting state. The patient was prepped and draped in usual sterile fashion. Local anesthesia with 1% lidocaine was administered to to the left infraclavicular area. A 5-6 cm horizontal incision was made parallel with and 2-3 cm caudal to the left clavicle, immediately underneath the previous scar. Using electrocautery and blunt dissection the prepectoral pocket was created down to the level of the device, which was easily explanted. The pocket was carefully inspected for hemostasis. A venogram was performed and showed that the left subclavian vein was widely patent to the SVC junction. Under fluoroscopic guidance and using the modified Seldinger technique 2 separate venipunctures were performed to access the left subclavian vein. Some difficulty was encountered accessing the vein.  A J-tip guidewires were subsequently exchanged for 7 French safe sheaths. The new RV pacing lead would not cross the brachiocephalic-SVC junction, so the sheath was exchanged over the wire for a long 32F sheath, after which the pacing lead was advanced without difficulty. Under fluoroscopic guidance the ventricular lead was advanced to level of the mid to apical right ventricular septum and the active-fixation helix was deployed. Prominent current of injury was seen. Satisfactory pacing threshold was found, but sensing was variable. The helix was retracted and the lead was repositioned. Good pacing and sensing parameters were recorded. There was no evidence of diaphragmatic stimulation at maximum device output. The safe sheath was peeled away and the lead was secured in place with 2-0 silk. The pocket  was flushed with copious amounts of antibiotic solution. Reinspection showed excellent hemostasis.. The ventricular lead was connected to the  generator and appropriate ventricular pacing was seen. Subsequently the old atrial lead was also connected. Repeat testing of the lead parameters via telemetry showed excellent values. The chronic RV lead was capped. The entire system was then carefully inserted in the pocket with care been taking that the leads and device assumed a comfortable position without pressure on the incision. Great care was taken that the leads be located deep to the generator. The pocket was then closed in layers using 2 layers of 2-0 Vicryl, one layer of 3-0 Vicryl and Steristrips, after which a sterile dressing was applied. At the end of the procedure the following lead parameters were encountered: Right atrial lead sensed P waves 2.8 mV, impedance 437 ohms, threshold 0.75 V at 0.4 ms pulse width. Right ventricular lead sensed R waves 6.3 mV, impedance 627 ohms, threshold 0.75 V at 0.4 ms pulse width.  CUP PACEART REMOTE DEVICE CHECK  Result Date: 10/14/2019 1 month battery check. Device at [facility] since 10/10/19, routed for review  Disposition   Patient was seen and examined by Dr. Sallyanne Kuster who deemed patient as stable for discharge. Follow-up has been arranged. Discharge medications as listed below.   Follow-up Plans & Appointments    Follow-up Information    [facility] [place] [facility] Follow up on 11/05/2019.   Specialty: Cardiology Why: Please arrive 15 minutes early for your 8:30am wound check appointment Contact information: [address] [place] [phone]       Sanda Klein, MD Follow up on 11/11/2019.   Specialty: Cardiology Why: Please arrive 15 minutes early for your 8:40am post-hospital cardiology follow-up appointment Contact information: [address] [place] [address] [phone]          Discharge Instructions    Diet - low sodium heart healthy   Complete by: As directed    Increase activity slowly   Complete by: As  directed       Discharge Medications   Allergies as of 10/22/2019      Reactions   Bee Venom    swelling      Medication List    TAKE these medications   Alpha-Lipoic Acid 100 MG Tabs Take 100 mg by mouth 3 (three) times daily.   aspirin 81 MG tablet Take 81 mg by mouth daily.   b complex vitamins tablet Take 1 tablet by mouth daily.   Co Q 10 100 MG Caps Take 100 mg by mouth daily.   EPINEPHrine 0.3 mg/0.3 mL Soaj injection Commonly known as: EPI-PEN Inject 0.3 mg into the muscle as needed for anaphylaxis.   Fish Oil 1200 MG Caps Take 1,200 mg by mouth daily.   Garlic 123XX123 MG Caps Take 1,000 mg by mouth daily.   Glucosamine Chondr 1500 Complx Caps Take 1 capsule by mouth 2 (two) times daily.   loratadine 10 MG tablet Commonly known as: CLARITIN Take 10 mg by mouth daily.   Lycopene 10 MG Caps Take 10 mg by mouth daily.   MAGNESIUM PO Take 1 Dose by mouth 2 (two) times daily. Powder form   Melatonin 10 MG Caps Take 10 mg by mouth at bedtime.   multivitamin tablet Take 1 tablet by mouth daily.   Prostate Health Caps Take 1 capsule by mouth 3 (three) times daily.   vitamin C 250 MG tablet Commonly known as:  ASCORBIC ACID Take 250 mg by mouth 3 (three) times daily.   Vitamin D3 50 MCG (2000 UT) Tabs Take 2,000 Units by mouth daily.   vitamin E 400 UNIT capsule Generic drug: vitamin E Take 400 Units by mouth 3 (three) times daily.         Outstanding Labs/Studies   None  Duration of Discharge Encounter   Greater than 30 minutes including physician time.  Signed, Abigail Butts PA-C 10/22/2019, 10:08 AM

## 2019-10-22 NOTE — Progress Notes (Signed)
Patient given discharge instructions and follow up appointment requirements.  IV removed and telemetry removed. Patient drove to the hospital for pacemaker procedure and will be driving home. Patient aware of limited driving recommendations and states MD aware he is driving home.  Randell Loop, RN

## 2019-10-22 NOTE — Discharge Instructions (Signed)
° °  Elsevier Patient Education  2020 [place] Discharge Instructions for  Pacemaker/Defibrillator Patients  Activity Do not raise your left/right arm above shoulder level or extend it backward beyond shoulder level for 2 weeks. Wear the arm sling as a reminder or as needed for comfort for 2 weeks. No heavy lifting or vigorous activity with your left/right arm for 6-8 weeks.    NO DRIVING is preferable for 2 weeks; If absolutely necessary, drive only short, familiar routes. DO wear your seatbelt, even if it crosses over the pacemaker site.  WOUND CARE - Keep the wound area clean and dry.  Remove the dressing the day after you return home (usually 48 hours after the procedure). - DO NOT SUBMERGE UNDER WATER UNTIL FULLY HEALED (no tub baths, hot tubs, swimming pools, etc.).  - You  may shower or take a sponge bath after the dressing is removed. DO NOT SOAK the area and do not allow the shower to directly spray on the site. - If you have tape/steri-strips on your wound, these will fall off; do not pull them off prematurely.   - No bandage is needed on the site.  DO  NOT apply any creams, oils, or ointments to the wound area. - If you notice any drainage or discharge from the wound, any swelling, excessive redness or bruising at the site, or if you develop a fever > 101? F after you are discharged home, call the office at once.  Special Instructions - You are still able to use cellular telephones.  Avoid carrying your cellular phone near your device. - When traveling through airports, show security personnel your identification card to avoid being screened in the metal detectors.  - Avoid arc welding equipment, MRI testing (magnetic resonance imaging), TENS units (transcutaneous nerve stimulators).  Call the office for questions about other devices. - Avoid electrical appliances that are in poor condition or are not properly grounded. - Microwave ovens are safe to be near or to  operate.

## 2019-10-22 NOTE — Progress Notes (Signed)
Progress Note  Patient Name: David Booth Date of Encounter: 10/22/2019  Primary Cardiologist:  Evalyn Shultis  Subjective   No complaints, no tenderness at site.  Inpatient Medications    Scheduled Meds: . loratadine  10 mg Oral Daily   Continuous Infusions: . sodium chloride 1,000 mL (10/22/19 0515)  .  ceFAZolin (ANCEF) IV 1 g (10/22/19 0516)   PRN Meds: sodium chloride, acetaminophen, ondansetron (ZOFRAN) IV   Vital Signs    Vitals:   10/21/19 1830 10/21/19 2045 10/22/19 0047 10/22/19 0352  BP: 140/69 (!) 115/55 118/69 128/65  Pulse: 60 (!) 59 60 (!) 59  Resp:  20 20 20   Temp:  97.6 F (36.4 C) 98.6 F (37 C) 97.8 F (36.6 C)  TempSrc:  Oral Oral Oral  SpO2:  96% 97%   Weight:    74.7 kg  Height:        Intake/Output Summary (Last 24 hours) at 10/22/2019 0755 Last data filed at 10/22/2019 0352 Gross per 24 hour  Intake 240 ml  Output 875 ml  Net -635 ml   Last 3 Weights 10/22/2019 10/21/2019 07/04/2018  Weight (lbs) 164 lb 9.6 oz 150 lb 160 lb  Weight (kg) 74.662 kg 68.04 kg 72.576 kg      Telemetry    A paced, V sensed  - Personally Reviewed  ECG    A paced V sensed , old RBBB - Personally Reviewed  Physical Exam  Appears well. No oozing or swelling at surgical site GEN: No acute distress.   Neck: No JVD Cardiac: RRR, widely split S2, no murmurs, rubs, or gallops.  Respiratory: Clear to auscultation bilaterally. GI: Soft, nontender, non-distended  MS: No edema; No deformity. Neuro:  Nonfocal  Psych: Normal affect   Labs    High Sensitivity Troponin:  No results for input(s): TROPONINIHS in the last 720 hours.    Chemistry Recent Labs  Lab 10/17/19 1408  NA 145*  K 4.3  CL 107*  CO2 24  GLUCOSE 73  BUN 15  CREATININE 1.11  CALCIUM 9.3  GFRNONAA 61  GFRAA 70     Hematology Recent Labs  Lab 10/17/19 1408  WBC 4.4  RBC 4.50  HGB 13.7  HCT 40.6  MCV 90  MCH 30.4  MCHC 33.7  RDW 13.5  PLT 150    BNPNo results for  input(s): BNP, PROBNP in the last 168 hours.   DDimer No results for input(s): DDIMER in the last 168 hours.   Radiology    EP PPM/ICD IMPLANT  Result Date: 10/21/2019 1. Implantation of new right ventricular lead and generator changeout of dual chamber permanent pacemaker 2. Left upper extremity venogram 3. Light sedation Reason for procedure: Symptomatic bradycardia due to: Sinus node dysfunction Tachycardia-bradycardia syndrome RV lead malfunction (poor sensing and worsening capture threshold) Procedure performed by: Sanda Klein, MD Complications: None Estimated blood loss: <10 mL Medications administered during procedure: Ancef 2 g intravenously Vancomycin 1 g intravenously Lidocaine 1% 30 mL locally, Fentanyl 25 mcg intravenously Versed 1 mg intravenously 20 mL Omnipaque During this procedure the patient is administered a total of Versed 1 mg and Fentanyl 25 mcg IV to achieve and maintain moderate conscious sedation.  The patient's heart rate, blood pressure, and oxygen saturation are monitored continuously during the procedure. The period of conscious sedation is 68 minutes, of which I was present face-to-face 100% of this time. Device details: Generator Medtronic Burgess DR model [id number] serial number [id number] Right atrial lead Medtronic  [id number] serial number [id number] (02/06/2010) Right ventricular lead (new) Medtronic [id number] serial number [id number] Abandoned (capped) chronic RV lead Medtronic 5086, [id number] Procedure details: After the risks and benefits of the procedure were discussed the patient provided informed consent and was brought to the cardiac cath lab in the fasting state. The patient was prepped and draped in usual sterile fashion. Local anesthesia with 1% lidocaine was administered to to the left infraclavicular area. A 5-6 cm horizontal incision was made parallel with and 2-3 cm caudal to the left clavicle, immediately underneath the previous scar. Using electrocautery and  blunt dissection the prepectoral pocket was created down to the level of the device, which was easily explanted. The pocket was carefully inspected for hemostasis. A venogram was performed and showed that the left subclavian vein was widely patent to the SVC junction. Under fluoroscopic guidance and using the modified Seldinger technique 2 separate venipunctures were performed to access the left subclavian vein. Some difficulty was encountered accessing the vein.  A J-tip guidewires were subsequently exchanged for 7 French safe sheaths. The new RV pacing lead would not cross the brachiocephalic-SVC junction, so the sheath was exchanged over the wire for a long 75F sheath, after which the pacing lead was advanced without difficulty. Under fluoroscopic guidance the ventricular lead was advanced to level of the mid to apical right ventricular septum and the active-fixation helix was deployed. Prominent current of injury was seen. Satisfactory pacing threshold was found, but sensing was variable. The helix was retracted and the lead was repositioned. Good pacing and sensing parameters were recorded. There was no evidence of diaphragmatic stimulation at maximum device output. The safe sheath was peeled away and the lead was secured in place with 2-0 silk. The pocket was flushed with copious amounts of antibiotic solution. Reinspection showed excellent hemostasis.. The ventricular lead was connected to the generator and appropriate ventricular pacing was seen. Subsequently the old atrial lead was also connected. Repeat testing of the lead parameters via telemetry showed excellent values. The chronic RV lead was capped. The entire system was then carefully inserted in the pocket with care been taking that the leads and device assumed a comfortable position without pressure on the incision. Great care was taken that the leads be located deep to the generator. The pocket was then closed in layers using 2 layers of 2-0 Vicryl,  one layer of 3-0 Vicryl and Steristrips, after which a sterile dressing was applied. At the end of the procedure the following lead parameters were encountered: Right atrial lead sensed P waves 2.8 mV, impedance 437 ohms, threshold 0.75 V at 0.4 ms pulse width. Right ventricular lead sensed R waves 6.3 mV, impedance 627 ohms, threshold 0.75 V at 0.4 ms pulse width.   Cardiac Studies   10/22/2019 device check P waves 2.6 mV, impedance 418 ohm, threshold 0.75@0 .4 ms R waves 5/0 mV, impedance 494 ohm, threshold 0.5V@0 .4 ms   Patient Profile     85 y.o. male with SSS and tachy-brady sd. S/p dual chamber PPM generator change (for RRT) and new RV lead (for poor sensing and deteriorating threshold)  Assessment & Plan    Surgical site looks healthy. CXR without pneumothorax, appropriate lead position (my review). DC home today. Activity restrictions and wound care reviewed. Wound check 14 days and office visit for RV lead setting reprogramming in 3 months.  For questions or updates, please contact [facility] Please consult [email or web address] for contact info under        Signed,  Sanda Klein, MD  10/22/2019, 7:55 AM

## 2019-10-22 NOTE — Progress Notes (Signed)
Responded to Spiritual Consult for notary on an Scientist, physiological.  Delivered form to Mr. Raus and explained it to him.  He indicated he understood how to complete the form.  Assured Mr. Harcum we would attend to getting it notarized once he filled it out.  Invited him to let his nurse when the form is ready and we will return with notary.  De Burrs Chaplain Resident

## 2019-10-22 NOTE — Plan of Care (Signed)
Patient is aware of pacemaker procedure and discharge instructions as well as follow up needs.  Problem: Education: Goal: Knowledge of cardiac device and self-care will improve Outcome: Adequate for Discharge Goal: Ability to safely manage health related needs after discharge will improve Outcome: Adequate for Discharge Goal: Individualized Educational Video(s) Outcome: Adequate for Discharge   Problem: Cardiac: Goal: Ability to achieve and maintain adequate cardiopulmonary perfusion will improve Outcome: Adequate for Discharge

## 2019-11-05 ENCOUNTER — Ambulatory Visit (INDEPENDENT_AMBULATORY_CARE_PROVIDER_SITE_OTHER): Payer: Medicare Other | Admitting: *Deleted

## 2019-11-05 ENCOUNTER — Other Ambulatory Visit: Payer: Self-pay

## 2019-11-05 DIAGNOSIS — I495 Sick sinus syndrome: Secondary | ICD-10-CM | POA: Diagnosis not present

## 2019-11-05 LAB — CUP PACEART INCLINIC DEVICE CHECK
Battery Remaining Longevity: 155 mo
Battery Voltage: 3.11 V
Brady Statistic AP VP Percent: 0.09 %
Brady Statistic AP VS Percent: 93.04 %
Brady Statistic AS VP Percent: 0 %
Brady Statistic AS VS Percent: 6.87 %
Brady Statistic RA Percent Paced: 93.41 %
Brady Statistic RV Percent Paced: 0.09 %
Date Time Interrogation Session: 20210105083800
Implantable Lead Implant Date: 20110429
Implantable Lead Implant Date: 20201221
Implantable Lead Location: 753859
Implantable Lead Location: 753860
Implantable Lead Model: 5076
Implantable Pulse Generator Implant Date: 20201221
Lead Channel Impedance Value: 342 Ohm
Lead Channel Impedance Value: 399 Ohm
Lead Channel Impedance Value: 437 Ohm
Lead Channel Impedance Value: 532 Ohm
Lead Channel Pacing Threshold Amplitude: 0.5 V
Lead Channel Pacing Threshold Amplitude: 0.75 V
Lead Channel Pacing Threshold Pulse Width: 0.4 ms
Lead Channel Pacing Threshold Pulse Width: 0.4 ms
Lead Channel Sensing Intrinsic Amplitude: 2.125 mV
Lead Channel Sensing Intrinsic Amplitude: 6.375 mV
Lead Channel Setting Pacing Amplitude: 1.75 V
Lead Channel Setting Pacing Amplitude: 3.5 V
Lead Channel Setting Pacing Pulse Width: 0.4 ms
Lead Channel Setting Sensing Sensitivity: 1.2 mV

## 2019-11-05 NOTE — Patient Instructions (Signed)
Call the office if you have any increased swelling, drainage, or redness at wound site.

## 2019-11-05 NOTE — Progress Notes (Signed)
Wound check appointment. Steri-strips removed. Wound without redness or edema. Incision edges approximated, wound well healed. Normal device function. Thresholds, sensing, and impedances consistent with implant measurements. Device programmed at 3.5V/auto capture programmed on for extra safety margin until 3 month visit. Histogram distribution appropriate for patient and level of activity. No mode switches or high ventricular rates noted. Patient educated about wound care, arm mobility, lifting restrictions. ROV with Dr Recardo Evangelist on 01/20/20 and next remote transmission on 01/21/20.

## 2019-11-11 ENCOUNTER — Encounter: Payer: Medicare Other | Admitting: Cardiovascular Disease

## 2019-11-16 NOTE — Progress Notes (Signed)
PPM remote 

## 2019-11-22 ENCOUNTER — Telehealth: Payer: Self-pay | Admitting: Emergency Medicine

## 2019-11-22 NOTE — Telephone Encounter (Signed)
The patient has an appointment 1/25 with Dr. Sallyanne Kuster.

## 2019-11-22 NOTE — Telephone Encounter (Signed)
Received my chart message. Patient was concerned about a high HR reading this morning after doing his stretching exercises around 1030. Patient reports he has been asymptomatic . He reports no CP, SOB, dizziness or syncope. Had patient send manual transmission. Transmission showed intermittent episodes of AF with RVR from 1010 to 1249 today. The longest episode was at 1011 and was 1 hr 49 minutes long.HX AF,  AT/AF burden 1.4%. No OAC.Marland Kitchen

## 2019-11-25 ENCOUNTER — Encounter: Payer: Self-pay | Admitting: Cardiovascular Disease

## 2019-11-25 ENCOUNTER — Ambulatory Visit (INDEPENDENT_AMBULATORY_CARE_PROVIDER_SITE_OTHER): Payer: Medicare Other | Admitting: Cardiovascular Disease

## 2019-11-25 ENCOUNTER — Other Ambulatory Visit: Payer: Self-pay

## 2019-11-25 VITALS — BP 122/68 | HR 85 | Ht 69.0 in | Wt 167.6 lb

## 2019-11-25 DIAGNOSIS — I453 Trifascicular block: Secondary | ICD-10-CM

## 2019-11-25 DIAGNOSIS — Z950 Presence of cardiac pacemaker: Secondary | ICD-10-CM

## 2019-11-25 DIAGNOSIS — I495 Sick sinus syndrome: Secondary | ICD-10-CM | POA: Diagnosis not present

## 2019-11-25 DIAGNOSIS — I480 Paroxysmal atrial fibrillation: Secondary | ICD-10-CM

## 2019-11-25 MED ORDER — APIXABAN 5 MG PO TABS: 5.0000 mg | ORAL_TABLET | Freq: Two times a day (BID) | ORAL | 11 refills | Status: DC

## 2019-11-25 MED ORDER — METOPROLOL TARTRATE 25 MG PO TABS: 12.5000 mg | ORAL_TABLET | Freq: Two times a day (BID) | ORAL | 3 refills | Status: DC

## 2019-11-25 NOTE — Progress Notes (Addendum)
Cardiology Office Note    Date:  11/25/2019   ID:  David Booth, David Booth May 11, 1933, MRN [id number]  PCP:  David Bien, MD  Cardiologist:   David Klein, MD   Chief Complaint  Patient presents with  . Atrial Fibrillation  . Pacemaker Check    History of Present Illness:  David Booth is a 86 y.o. male with a history of tachycardia-bradycardia syndrome (paroxysmal atrial tachycardia and sinus node dysfunction), chronic right bundle branch block, left anterior fascicular block and long PR interval (trifascicular block) leading to implantation of a dual-chamber permanent pacemaker, now approximately 1 month status post generator change out (Medtronic Azure dual-chamber) and implantation of a new right ventricular lead (Medtronic 5076) due to poor sensing/capture threshold on the initial RV lead (which was capped and left in place).  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  His blood pressure is consistently in the 110s-130/60s-70 range, with very rare exception.  Prior to the generator change out his typical heart rate at rest was in the low 60s, but in the last couple of weeks his rate has been in the 80s and even 90s.  On at least one occasion on January 22 his ventricular rate was 139 and this was associated with a low blood pressure (94/60) and slight dizziness.  He was not aware of palpitations.  Interrogation of his pacemaker shows that he has 87% atrial paced rhythm and does not require ventricular pacing.  He has had 2 distinct episodes of paroxysmal atrial fibrillation about the ventricular response lasting for 3 hours and feels respectively on January 22 and January 24.  Device parameters are otherwise good.  Right ventricular sensing and capture thresholds are excellent, but right ventricular lead output is still at original "high" settings.  Estimated generator longevity is 13 years.  He keeps meticulous control  over his dietary sodium intake as well.  He takes no more than 1500 mg of sodium daily and this has allowed him to stop all of his antihypertensive medications.  The patient specifically denies any chest pain at rest exertion, dyspnea at rest or with exertion, orthopnea, paroxysmal nocturnal dyspnea, syncope, palpitations, focal neurological deficits, intermittent claudication, lower extremity edema, unexplained weight gain, cough, hemoptysis or wheezing.  He has occasional orthostatic dizziness.  He has not had any bleeding problems, falls or injuries.  His daughter David Booth is a chiropractic.  We had her on a conference call today while we are discussing changes in treatment.   Past Medical History:  Diagnosis Date  . Coronary artery disease   . Hypertension   . Neuropathy   . Numbness in feet   . Pacemaker     Past Surgical History:  Procedure Laterality Date  . LEAD REVISION/REPAIR N/A 10/21/2019   Procedure: LEAD REVISION/REPAIR;  Surgeon: David Klein, MD;  Location: [facility];  Service: Cardiovascular;  Laterality: N/A;  . pace maker    . PPM GENERATOR CHANGEOUT N/A 10/21/2019   Procedure: PPM GENERATOR CHANGEOUT;  Surgeon: David Klein, MD;  Location: [facility];  Service: Cardiovascular;  Laterality: N/A;    Current Medications: Outpatient Medications Prior to Visit  Medication Sig Dispense Refill  . Alpha-Lipoic Acid 100 MG TABS Take 100 mg by mouth 3 (three) times daily.     Marland Kitchen b complex vitamins tablet Take 1 tablet by mouth daily.    . Cholecalciferol (VITAMIN D3) 2000  UNITS TABS Take 2,000 Units by mouth daily.     . Coenzyme Q10 (CO Q 10) 100 MG CAPS Take 100 mg by mouth daily.     Marland Kitchen EPINEPHrine (EPI-PEN) 0.3 mg/0.3 mL DEVI Inject 0.3 mg into the muscle as needed for anaphylaxis.     . Garlic 123XX123 MG CAPS Take 1,000 mg by mouth daily.    . Glucosamine-Chondroit-Vit C-Mn (GLUCOSAMINE CHONDR 1500 COMPLX) CAPS Take 1 capsule by mouth 2 (two)  times daily.    Marland Kitchen loratadine (CLARITIN) 10 MG tablet Take 10 mg by mouth daily.    . Lycopene 10 MG CAPS Take 10 mg by mouth daily.     Marland Kitchen MAGNESIUM PO Take 1 Dose by mouth 2 (two) times daily. Powder form    . Melatonin 10 MG CAPS Take 10 mg by mouth at bedtime.     . Misc Natural Products (PROSTATE HEALTH) CAPS Take 1 capsule by mouth 3 (three) times daily.    . Multiple Vitamin (MULTIVITAMIN) tablet Take 1 tablet by mouth daily.    . Omega-3 Fatty Acids (FISH OIL) 1200 MG CAPS Take 1,200 mg by mouth daily.     . vitamin C (ASCORBIC ACID) 250 MG tablet Take 250 mg by mouth 3 (three) times daily.     . vitamin E (VITAMIN E) 400 UNIT capsule Take 400 Units by mouth 3 (three) times daily.     Marland Kitchen aspirin 81 MG tablet Take 81 mg by mouth daily.       No facility-administered medications prior to visit.     Allergies:   Bee venom   Social History   Socioeconomic History  . Marital status: Divorced    Spouse name: Not on file  . Number of children: 2  . Years of education: college  . Highest education level: Not on file  Occupational History    Comment: retired  Tobacco Use  . Smoking status: Never Smoker  . Smokeless tobacco: Never Used  Substance and Sexual Activity  . Alcohol use: No    Alcohol/week: 0.0 standard drinks  . Drug use: No  . Sexual activity: Not on file  Other Topics Concern  . Not on file  Social History Narrative   Patient lives at home alone with his girlfriend.   Retired.    Education. College education.   Right handed.   Caffeine None.   Social Determinants of Health   Financial Resource Strain:   . Difficulty of Paying Living Expenses: Not on file  Food Insecurity:   . Worried About Charity fundraiser in the Last Year: Not on file  . Ran Out of Food in the Last Year: Not on file  Transportation Needs:   . Lack of Transportation (Medical): Not on file  . Lack of Transportation (Non-Medical): Not on file  Physical Activity:   . Days of Exercise  per Week: Not on file  . Minutes of Exercise per Session: Not on file  Stress:   . Feeling of Stress : Not on file  Social Connections:   . Frequency of Communication with Friends and Family: Not on file  . Frequency of Social Gatherings with Friends and Family: Not on file  . Attends Religious Services: Not on file  . Active Member of Clubs or Organizations: Not on file  . Attends Archivist Meetings: Not on file  . Marital Status: Not on file     Family History:  The patient's family history includes Alzheimer's  disease in his mother; Heart attack in his father.   ROS:   Please see the history of present illness.    All other systems are reviewed and are negative.   PHYSICAL EXAM:   VS:  BP 122/68   Pulse 85   Ht 5\' 9"  (1.753 m)   Wt 167 lb 9.6 oz (76 kg)   BMI 24.75 kg/m     General: Alert, oriented x3, no distress, lean.  Healthy left subclavian pacemaker site. Head: no evidence of trauma, PERRL, EOMI, no exophtalmos or lid lag, no myxedema, no xanthelasma; normal ears, nose and oropharynx Neck: normal jugular venous pulsations and no hepatojugular reflux; brisk carotid pulses without delay and no carotid bruits Chest: clear to auscultation, no signs of consolidation by percussion or palpation, normal fremitus, symmetrical and full respiratory excursions Cardiovascular: normal position and quality of the apical impulse, regular rhythm, normal first and widely split second heart sounds, no murmurs, rubs or gallops Abdomen: no tenderness or distention, no masses by palpation, no abnormal pulsatility or arterial bruits, normal bowel sounds, no hepatosplenomegaly Extremities: no clubbing, cyanosis or edema; 2+ radial, ulnar and brachial pulses bilaterally; 2+ right femoral, posterior tibial and dorsalis pedis pulses; 2+ left femoral, posterior tibial and dorsalis pedis pulses; no subclavian or femoral bruits Neurological: grossly nonfocal Psych: Normal mood and  affect   Wt Readings from Last 3 Encounters:  11/25/19 167 lb 9.6 oz (76 kg)  10/22/19 164 lb 9.6 oz (74.7 kg)  07/04/18 160 lb (72.6 kg)    Studies/Labs Reviewed:   EKG:  EKG is ordered today.  It shows atrial paced, ventricular sensed rhythm with right bundle branch block and left axis deviation consistent with left anterior fascicular block, long AV delay.  QRS is 126 ms, QTC 454 ms.. ASSESSMENT:    1. Paroxysmal atrial fibrillation (HCC)   2. SSS (sick sinus syndrome) ([facility])   3. Pacemaker   4. Trifascicular block      PLAN:  In order of problems listed above:  1. Paroxysmal A. fib : This was detected many years in the past, but he was not on anticoagulation due to the very low burden of arrhythmia.  He has had 2 episodes in excess of 2 hours in the last 3 days.  CHA2DS2-VASc score is at least 2 (he no longer requires antihypertensive medications, but did have high blood pressure in the past).  Embolic risk is elevated and I recommended starting anticoagulation with Eliquis 5 mg twice daily.  Reviewed the risk of bleeding complications.  Also recommended metoprolol tartrate 12.5 mg twice daily.  Since his blood pressure is so well controlled I asked him to liberalize his sodium intake a little bit. 2. SSS: Underlying rhythm is sinus bradycardia around 40 bpm.  His heart rate histogram distribution appears appropriate, but he is not accustomed to these increased heart rates at rest.  Decreased the rate response sensor for both ADL and activity from "3" to "2". 3. PPM: Normal device function.  Need to reduce the output in his right ventricular lead to chronic levels in several more weeks.  He does have an abandoned/5086 RV lead and therefore his pacemaker system is not MRI conditional.  We will see him back in a month since we may have to adjust rate response settings again. ADDENDUM 11/27/2019: heart rate was still fast and changed the activity threshold from 'low" to "medium-high"  today. 4. Chronic "trifascicular" block: RBBB, left axis deviation, long AV delay.  So  far without need for ventricular pacing.     Medication Adjustments/Labs and Tests Ordered: Current medicines are reviewed at length with the patient today.  Concerns regarding medicines are outlined above.  Medication changes, Labs and Tests ordered today are listed in the Patient Instructions below. Patient Instructions  Medication Instructions:  START Eliquis 5 mg twice daily START Metoprolol Tartrate 12. 5 mg (half a tablet) twice daily  *If you need a refill on your cardiac medications before your next appointment, please call your pharmacy*  Lab Work: None ordered If you have labs (blood work) drawn today and your tests are completely normal, you will receive your results only by: Marland Kitchen MyChart Message (if you have MyChart) OR . A paper copy in the mail If you have any lab test that is abnormal or we need to change your treatment, we will call you to review the results.  Testing/Procedures: None ordered  Follow-Up: At [facility], you and your health needs are our priority.  As part of our continuing mission to provide you with exceptional heart care, we have created designated Provider Care Teams.  These Care Teams include your primary Cardiologist (physician) and Advanced Practice Providers (APPs -  Physician Assistants and Nurse Practitioners) who all work together to provide you with the care you need, when you need it.  Your next appointment:   1 month(s) on a pacer day  The format for your next appointment:   In Person  Provider:   Sanda Klein, MD     Signed, David Klein, MD  11/25/2019 2:42 PM    [address], [place], [place]  [address] Phone: [phone]; Fax: [phone]

## 2019-11-25 NOTE — Patient Instructions (Addendum)
Medication Instructions:  STOP the Aspirin START Eliquis 5 mg twice daily START Metoprolol Tartrate 12. 5 mg (half a tablet) twice daily  *If you need a refill on your cardiac medications before your next appointment, please call your pharmacy*  Lab Work: None ordered If you have labs (blood work) drawn today and your tests are completely normal, you will receive your results only by: Marland Kitchen MyChart Message (if you have MyChart) OR . A paper copy in the mail If you have any lab test that is abnormal or we need to change your treatment, we will call you to review the results.  Testing/Procedures: None ordered  Follow-Up: At [facility], you and your health needs are our priority.  As part of our continuing mission to provide you with exceptional heart care, we have created designated Provider Care Teams.  These Care Teams include your primary Cardiologist (physician) and Advanced Practice Providers (APPs -  Physician Assistants and Nurse Practitioners) who all work together to provide you with the care you need, when you need it.  Your next appointment:   1 month(s) on a pacer day  The format for your next appointment:   In Person  Provider:   Sanda Klein, MD

## 2019-11-28 NOTE — Addendum Note (Signed)
Addended by: Therisa Doyne on: 11/28/2019 02:41 PM   Modules accepted: Orders

## 2019-11-29 ENCOUNTER — Other Ambulatory Visit: Payer: Self-pay | Admitting: Cardiovascular Disease

## 2019-12-04 ENCOUNTER — Other Ambulatory Visit: Payer: Self-pay | Admitting: Cardiovascular Disease

## 2019-12-06 ENCOUNTER — Other Ambulatory Visit: Payer: Self-pay | Admitting: *Deleted

## 2019-12-06 MED ORDER — APIXABAN 5 MG PO TABS: 5.0000 mg | ORAL_TABLET | Freq: Two times a day (BID) | ORAL | 3 refills | Status: DC

## 2019-12-08 ENCOUNTER — Ambulatory Visit: Payer: Medicare Other | Attending: Internal Medicine

## 2019-12-08 DIAGNOSIS — Z23 Encounter for immunization: Secondary | ICD-10-CM

## 2019-12-08 NOTE — Progress Notes (Signed)
   Covid-19 Vaccination Clinic  Name:  David Booth    MRN: [id number] DOB: 1932/11/28  12/08/2019  [name] was observed post Covid-19 immunization for 30 minutes based on pre-vaccination screening without incidence. He was provided with Vaccine Information Sheet and instruction to access the V-Safe system.   [name] was instructed to call 911 with any severe reactions post vaccine: Marland Kitchen Difficulty breathing  . Swelling of your face and throat  . A fast heartbeat  . A bad rash all over your body  . Dizziness and weakness    Immunizations Administered    Name Date Dose VIS Date Route   Pfizer COVID-19 Vaccine 12/08/2019  4:38 PM 0.3 mL 10/11/2019 Intramuscular   Manufacturer: [place]   Lot: [id number]   [place]: [id number]

## 2019-12-09 ENCOUNTER — Telehealth: Payer: Self-pay | Admitting: *Deleted

## 2019-12-09 NOTE — Telephone Encounter (Signed)
Eliquis assistance forms have been faxed.

## 2019-12-10 ENCOUNTER — Ambulatory Visit: Payer: Medicare Other | Admitting: Cardiovascular Disease

## 2019-12-11 DIAGNOSIS — H2513 Age-related nuclear cataract, bilateral: Secondary | ICD-10-CM | POA: Diagnosis not present

## 2019-12-11 DIAGNOSIS — H02051 Trichiasis without entropian right upper eyelid: Secondary | ICD-10-CM | POA: Diagnosis not present

## 2019-12-11 DIAGNOSIS — H02403 Unspecified ptosis of bilateral eyelids: Secondary | ICD-10-CM | POA: Diagnosis not present

## 2019-12-11 DIAGNOSIS — H43813 Vitreous degeneration, bilateral: Secondary | ICD-10-CM | POA: Diagnosis not present

## 2019-12-16 DIAGNOSIS — Z91030 Bee allergy status: Secondary | ICD-10-CM | POA: Diagnosis not present

## 2019-12-16 DIAGNOSIS — J309 Allergic rhinitis, unspecified: Secondary | ICD-10-CM | POA: Diagnosis not present

## 2019-12-16 DIAGNOSIS — I10 Essential (primary) hypertension: Secondary | ICD-10-CM | POA: Diagnosis not present

## 2019-12-23 NOTE — Telephone Encounter (Signed)
The Eliquis assistance has been approved through 10/30/2020.

## 2019-12-25 ENCOUNTER — Ambulatory Visit: Payer: Medicare Other

## 2019-12-30 ENCOUNTER — Encounter: Payer: Self-pay | Admitting: Cardiovascular Disease

## 2019-12-30 ENCOUNTER — Other Ambulatory Visit: Payer: Self-pay

## 2019-12-30 ENCOUNTER — Ambulatory Visit (INDEPENDENT_AMBULATORY_CARE_PROVIDER_SITE_OTHER): Payer: Medicare Other | Admitting: Cardiovascular Disease

## 2019-12-30 VITALS — BP 140/86 | HR 66 | Ht 69.0 in | Wt 170.6 lb

## 2019-12-30 DIAGNOSIS — Z7901 Long term (current) use of anticoagulants: Secondary | ICD-10-CM

## 2019-12-30 DIAGNOSIS — I453 Trifascicular block: Secondary | ICD-10-CM

## 2019-12-30 DIAGNOSIS — I495 Sick sinus syndrome: Secondary | ICD-10-CM | POA: Diagnosis not present

## 2019-12-30 DIAGNOSIS — I480 Paroxysmal atrial fibrillation: Secondary | ICD-10-CM | POA: Diagnosis not present

## 2019-12-30 DIAGNOSIS — Z950 Presence of cardiac pacemaker: Secondary | ICD-10-CM

## 2019-12-30 NOTE — Progress Notes (Signed)
Cardiology Office Note    Date:  12/30/2019   ID:  HAWKINS DUYCK, DOB 05-01-1933, MRN [id number]  PCP:  [name], MD  Cardiologist:   Sanda Klein, MD   No chief complaint on file.   History of Present Illness:  David Booth is a 86 y.o. male with a history of tachycardia-bradycardia syndrome (paroxysmal atrial tachycardia and sinus node dysfunction), chronic right bundle branch block, left anterior fascicular block and long PR interval (trifascicular block) leading to implantation of a dual-chamber permanent pacemaker (recent generator change out with Medtronic Azure) and implantation of a new right ventricular lead Medtronic 5076, due to poor sensing/capture threshold on the initial RV lead (which was capped and left in place).  Recently added metoprolol.  He had also noticed some problems with excessively rapid heartbeats and we made his device sensor settings less aggressive.  Interrogation of his device now still shows 96% atrial pacing and almost no ventricular pacing (0.2%).  The histograms have shifted substantially to the left, as is expected with less aggressive sensor settings.  He feels better with the slower heart rates.  He is not pacemaker dependent and has underlying sinus bradycardia with 1: 1 AV conduction at 45 bpm.  His current generator is essentially at beginning of life with an estimated longevity of 12.5 years (Medtronic Azure implanted October 2020).  Right ventricular lead parameters are good.  He has not had any atrial fibrillation since his last device check a month ago.  He keeps meticulous control over his dietary sodium intake as well.  He takes no more than 1500 mg of sodium daily and this has allowed him to stop all of his antihypertensive medications.  The patient specifically denies any chest pain at rest exertion, dyspnea at rest or with exertion, orthopnea, paroxysmal nocturnal dyspnea, syncope, palpitations, focal neurological deficits,  intermittent claudication, lower extremity edema, unexplained weight gain, cough, hemoptysis or wheezing.  He has not had any falls, injuries or bleeding problems.  Mr. [name] has received both doses of his Covid vaccine and is looking forward to being more socially active  His daughter Rhyan Ammann is a chiropractic.    Past Medical History:  Diagnosis Date  . Coronary artery disease   . Hypertension   . Neuropathy   . Numbness in feet   . Pacemaker     Past Surgical History:  Procedure Laterality Date  . LEAD REVISION/REPAIR N/A 10/21/2019   Procedure: LEAD REVISION/REPAIR;  Surgeon: Sanda Klein, MD;  Location: [facility];  Service: Cardiovascular;  Laterality: N/A;  . pace maker    . PPM GENERATOR CHANGEOUT N/A 10/21/2019   Procedure: PPM GENERATOR CHANGEOUT;  Surgeon: Sanda Klein, MD;  Location: [place];  Service: Cardiovascular;  Laterality: N/A;    Current Medications: Outpatient Medications Prior to Visit  Medication Sig Dispense Refill  . Alpha-Lipoic Acid 100 MG TABS Take 100 mg by mouth 3 (three) times daily.     Marland Kitchen apixaban (ELIQUIS) 5 MG TABS tablet Take 1 tablet (5 mg total) by mouth 2 (two) times daily. 180 tablet 3  . b complex vitamins tablet Take 1 tablet by mouth daily.    . Cholecalciferol (VITAMIN D3) 2000 UNITS TABS Take 2,000 Units by mouth daily.     . Coenzyme Q10 (CO Q 10) 100 MG CAPS Take 100 mg by mouth daily.     Marland Kitchen EPINEPHrine (EPI-PEN) 0.3 mg/0.3 mL DEVI Inject 0.3 mg into the muscle as  needed for anaphylaxis.     . Garlic 123XX123 MG CAPS Take 1,000 mg by mouth daily.    . Glucosamine-Chondroit-Vit C-Mn (GLUCOSAMINE CHONDR 1500 COMPLX) CAPS Take 1 capsule by mouth 2 (two) times daily.    Marland Kitchen loratadine (CLARITIN) 10 MG tablet Take 10 mg by mouth daily.    . Lycopene 10 MG CAPS Take 10 mg by mouth daily.     Marland Kitchen MAGNESIUM PO Take 1 Dose by mouth 2 (two) times daily. Powder form    . Melatonin 10 MG CAPS Take 10 mg by mouth at bedtime.      . metoprolol tartrate (LOPRESSOR) 25 MG tablet Take 0.5 tablets (12.5 mg total) by mouth 2 (two) times daily. 30 tablet 3  . Misc Natural Products (PROSTATE HEALTH) CAPS Take 1 capsule by mouth 3 (three) times daily.    . Multiple Vitamin (MULTIVITAMIN) tablet Take 1 tablet by mouth daily.    . Omega-3 Fatty Acids (FISH OIL) 1200 MG CAPS Take 1,200 mg by mouth daily.     . vitamin C (ASCORBIC ACID) 250 MG tablet Take 250 mg by mouth 3 (three) times daily.     . vitamin E (VITAMIN E) 400 UNIT capsule Take 400 Units by mouth 3 (three) times daily.      No facility-administered medications prior to visit.     Allergies:   Bee venom   Social History   Socioeconomic History  . Marital status: Divorced    Spouse name: Not on file  . Number of children: 2  . Years of education: college  . Highest education level: Not on file  Occupational History    Comment: retired  Tobacco Use  . Smoking status: Never Smoker  . Smokeless tobacco: Never Used  Substance and Sexual Activity  . Alcohol use: No    Alcohol/week: 0.0 standard drinks  . Drug use: No  . Sexual activity: Not on file  Other Topics Concern  . Not on file  Social History Narrative   Patient lives at home alone with his girlfriend.   Retired.    Education. College education.   Right handed.   Caffeine None.   Social Determinants of Health   Financial Resource Strain:   . Difficulty of Paying Living Expenses: Not on file  Food Insecurity:   . Worried About Charity fundraiser in the Last Year: Not on file  . Ran Out of Food in the Last Year: Not on file  Transportation Needs:   . Lack of Transportation (Medical): Not on file  . Lack of Transportation (Non-Medical): Not on file  Physical Activity:   . Days of Exercise per Week: Not on file  . Minutes of Exercise per Session: Not on file  Stress:   . Feeling of Stress : Not on file  Social Connections:   . Frequency of Communication with Friends and Family: Not  on file  . Frequency of Social Gatherings with Friends and Family: Not on file  . Attends Religious Services: Not on file  . Active Member of Clubs or Organizations: Not on file  . Attends Archivist Meetings: Not on file  . Marital Status: Not on file     Family History:  The patient's family history includes Alzheimer's disease in his mother; Heart attack in his father.   ROS:   Please see the history of present illness.    All other systems are reviewed and are negative.  PHYSICAL EXAM:   VS:  BP 140/86   Pulse 66   Ht 5\' 9"  (1.753 m)   Wt 170 lb 9.6 oz (77.4 kg)   SpO2 96%   BMI 25.19 kg/m      General: Alert, oriented x3, no distress, appears lean and fit Head: no evidence of trauma, PERRL, EOMI, no exophtalmos or lid lag, no myxedema, no xanthelasma; normal ears, nose and oropharynx Neck: normal jugular venous pulsations and no hepatojugular reflux; brisk carotid pulses without delay and no carotid bruits Chest: clear to auscultation, no signs of consolidation by percussion or palpation, normal fremitus, symmetrical and full respiratory excursions Cardiovascular: normal position and quality of the apical impulse, regular rhythm, normal first and widely split second heart sounds, no murmurs, rubs or gallops.  Well-healed left subclavian pacemaker site Abdomen: no tenderness or distention, no masses by palpation, no abnormal pulsatility or arterial bruits, normal bowel sounds, no hepatosplenomegaly Extremities: no clubbing, cyanosis or edema; 2+ radial, ulnar and brachial pulses bilaterally; 2+ right femoral, posterior tibial and dorsalis pedis pulses; 2+ left femoral, posterior tibial and dorsalis pedis pulses; no subclavian or femoral bruits Neurological: grossly nonfocal Psych: Normal mood and affect    Wt Readings from Last 3 Encounters:  12/30/19 170 lb 9.6 oz (77.4 kg)  11/25/19 167 lb 9.6 oz (76 kg)  10/22/19 164 lb 9.6 oz (74.7 kg)    Studies/Labs  Reviewed:   EKG:  EKG is not ordered today.  Previous tracing showed atrial paced, ventricular sensed rhythm with right bundle branch block and left axis deviation consistent with left anterior fascicular block, long AV delay.  QRS is 126 ms, QTC 454 ms.  Lipid Panel  No results found for: CHOL, TRIG, HDL, CHOLHDL, VLDL, LDLCALC, LDLDIRECT, LABVLDL 05/22/2019 Total cholesterol 171, HDL 48, triglycerides 70 10/17/2019 Hemoglobin 13.7, creatinine 1.11, potassium 4.3, normal liver function tests, TSH 2.45  ASSESSMENT:    1. Paroxysmal atrial fibrillation (HCC)   2. Long term current use of anticoagulant   3. SSS (sick sinus syndrome) ([place])   4. Pacemaker   5. Trifascicular block      PLAN:  In order of problems listed above:  1. Paroxysmal A. fib : Burden of arrhythmia is very low.  CHA2DS2-VASc score is at least 2 (he no longer requires antihypertensive medications, but did have high blood pressure in the past).   2. Anticoagulation: Well-tolerated without bleeding complications 3. SSS: Underlying rhythm is sinus bradycardia around 40 bpm.  He prefers a slower heart rate so we made his rate response sensor less aggressive at his last appointment and he feels better. 4. PPM: Normal device function.  He does have an abandoned/5086 RV lead and therefore his pacemaker system is not MRI conditional.  To reprogram the right ventricular output to chronic levels later this month. 5. Chronic "trifascicular" block: RBBB, left axis deviation, long AV delay.  So far without need for ventricular pacing.     Medication Adjustments/Labs and Tests Ordered: Current medicines are reviewed at length with the patient today.  Concerns regarding medicines are outlined above.  Medication changes, Labs and Tests ordered today are listed in the Patient Instructions below. Patient Instructions  Medication Instructions:  No changes If you need a refill on your cardiac medications before your next  appointment, please call your pharmacy.   Lab work: None ordered If you have labs (blood work) drawn today and your tests are completely normal, you will receive your results only by: [place] (if you have MyChart) OR A paper  copy in the mail If you have any lab test that is abnormal or we need to change your treatment, we will call you to review the results.  Testing/Procedures: None ordered  Follow-Up: At [facility], you and your health needs are our priority.  As part of our continuing mission to provide you with exceptional heart care, we have created designated Provider Care Teams.  These Care Teams include your primary Cardiologist (physician) and Advanced Practice Providers (APPs -  Physician Assistants and Nurse Practitioners) who all work together to provide you with the care you need, when you need it.  We recommend signing up for the patient portal called "MyChart".  Sign up information is provided on this After Visit Summary.  MyChart is used to connect with patients for Virtual Visits (Telemedicine).  Patients are able to view lab/test results, encounter notes, upcoming appointments, etc.  Non-urgent messages can be sent to your provider as well.   To learn more about what you can do with MyChart, go to [email or web address].    Your next appointment:   12 month(s)  The format for your next appointment:   In Person  Provider:   Sanda Klein, MD   Remote monitoring is used to monitor your pacemaker from home. This monitoring reduces the number of office visits required to check your device to one time per year. It allows [place] to keep an eye on the functioning of your device to ensure it is working properly. You are scheduled for a device check from home on 01/21/20. You may send your transmission at any time that day. If you have a wireless device, the transmission will be sent automatically.      Signed, Sanda Klein, MD  12/30/2019 4:50 PM    [facility] [address], [place], [place]  [address] Phone: [phone]; Fax: [phone]

## 2019-12-30 NOTE — Patient Instructions (Signed)
Medication Instructions:  No changes If you need a refill on your cardiac medications before your next appointment, please call your pharmacy.   Lab work: None ordered If you have labs (blood work) drawn today and your tests are completely normal, you will receive your results only by: [place] (if you have MyChart) OR A paper copy in the mail If you have any lab test that is abnormal or we need to change your treatment, we will call you to review the results.  Testing/Procedures: None ordered  Follow-Up: At [facility], you and your health needs are our priority.  As part of our continuing mission to provide you with exceptional heart care, we have created designated Provider Care Teams.  These Care Teams include your primary Cardiologist (physician) and Advanced Practice Providers (APPs -  Physician Assistants and Nurse Practitioners) who all work together to provide you with the care you need, when you need it.  We recommend signing up for the patient portal called "MyChart".  Sign up information is provided on this After Visit Summary.  MyChart is used to connect with patients for Virtual Visits (Telemedicine).  Patients are able to view lab/test results, encounter notes, upcoming appointments, etc.  Non-urgent messages can be sent to your provider as well.   To learn more about what you can do with MyChart, go to [email or web address].    Your next appointment:   12 month(s)  The format for your next appointment:   In Person  Provider:   Sanda Klein, MD   Remote monitoring is used to monitor your pacemaker from home. This monitoring reduces the number of office visits required to check your device to one time per year. It allows [place] to keep an eye on the functioning of your device to ensure it is working properly. You are scheduled for a device check from home on 01/21/20. You may send your transmission at any time that day. If you have a wireless device, the  transmission will be sent automatically.

## 2020-01-02 ENCOUNTER — Ambulatory Visit: Payer: Medicare Other | Attending: Internal Medicine

## 2020-01-02 DIAGNOSIS — Z23 Encounter for immunization: Secondary | ICD-10-CM

## 2020-01-02 NOTE — Progress Notes (Signed)
   Covid-19 Vaccination Clinic  Name:  JARIC BENETTI    MRN: [id number] DOB: 1933/08/01  01/02/2020  Mr. Venditto was observed post Covid-19 immunization for 15 minutes without incident. He was provided with Vaccine Information Sheet and instruction to access the V-Safe system.   Mr. Anastasiou was instructed to call 911 with any severe reactions post vaccine: Marland Kitchen Difficulty breathing  . Swelling of face and throat  . A fast heartbeat  . A bad rash all over body  . Dizziness and weakness   Immunizations Administered    Name Date Dose VIS Date Route   Pfizer COVID-19 Vaccine 01/02/2020  4:14 PM 0.3 mL 10/11/2019 Intramuscular   Manufacturer: [place]   Lot: [id number]   [place]: [id number]

## 2020-01-20 ENCOUNTER — Encounter: Payer: Medicare Other | Admitting: Cardiovascular Disease

## 2020-01-21 ENCOUNTER — Ambulatory Visit (INDEPENDENT_AMBULATORY_CARE_PROVIDER_SITE_OTHER): Payer: Medicare Other | Admitting: *Deleted

## 2020-01-21 DIAGNOSIS — I495 Sick sinus syndrome: Secondary | ICD-10-CM

## 2020-01-21 LAB — CUP PACEART REMOTE DEVICE CHECK
Battery Remaining Longevity: 155 mo
Battery Voltage: 3.1 V
Brady Statistic AP VP Percent: 0.38 %
Brady Statistic AP VS Percent: 93.34 %
Brady Statistic AS VP Percent: 0 %
Brady Statistic AS VS Percent: 6.28 %
Brady Statistic RA Percent Paced: 94.05 %
Brady Statistic RV Percent Paced: 0.39 %
Date Time Interrogation Session: 20210323001522
Implantable Lead Implant Date: 20110429
Implantable Lead Implant Date: 20201221
Implantable Lead Location: 753859
Implantable Lead Location: 753860
Implantable Lead Model: 5076
Implantable Pulse Generator Implant Date: 20201221
Lead Channel Impedance Value: 323 Ohm
Lead Channel Impedance Value: 437 Ohm
Lead Channel Impedance Value: 475 Ohm
Lead Channel Impedance Value: 703 Ohm
Lead Channel Pacing Threshold Amplitude: 0.625 V
Lead Channel Pacing Threshold Amplitude: 0.75 V
Lead Channel Pacing Threshold Pulse Width: 0.4 ms
Lead Channel Pacing Threshold Pulse Width: 0.4 ms
Lead Channel Sensing Intrinsic Amplitude: 2.125 mV
Lead Channel Sensing Intrinsic Amplitude: 2.125 mV
Lead Channel Sensing Intrinsic Amplitude: 7.375 mV
Lead Channel Sensing Intrinsic Amplitude: 7.375 mV
Lead Channel Setting Pacing Amplitude: 1.5 V
Lead Channel Setting Pacing Amplitude: 3.5 V
Lead Channel Setting Pacing Pulse Width: 0.4 ms
Lead Channel Setting Sensing Sensitivity: 1.2 mV

## 2020-01-22 NOTE — Progress Notes (Signed)
PPM Remote  

## 2020-01-27 ENCOUNTER — Encounter: Payer: Medicare Other | Admitting: Cardiovascular Disease

## 2020-02-25 MED ORDER — SILDENAFIL CITRATE 50 MG PO TABS: 50.0000 mg | ORAL_TABLET | Freq: Every day | ORAL | 0 refills | Status: AC | PRN

## 2020-02-25 NOTE — Telephone Encounter (Signed)
Yes. He can and I can Rx sildenafil 50 mg daily as needed, just give #5 and we can refill if he finds the prescription beneficial and without side effects. Please warn about nitrate interaction (I do not anticipate he will be prescribed nitrates, but he needs to be aware of the risk).

## 2020-02-27 DIAGNOSIS — Z20822 Contact with and (suspected) exposure to covid-19: Secondary | ICD-10-CM | POA: Diagnosis not present

## 2020-03-02 ENCOUNTER — Ambulatory Visit: Payer: Medicare Other | Attending: Internal Medicine

## 2020-03-02 DIAGNOSIS — Z20822 Contact with and (suspected) exposure to covid-19: Secondary | ICD-10-CM | POA: Diagnosis not present

## 2020-03-03 LAB — NOVEL CORONAVIRUS, NAA: SARS-CoV-2, NAA: NOT DETECTED

## 2020-03-03 LAB — SARS-COV-2, NAA 2 DAY TAT

## 2020-04-21 ENCOUNTER — Other Ambulatory Visit: Payer: Self-pay | Admitting: Cardiovascular Disease

## 2020-04-21 ENCOUNTER — Ambulatory Visit (INDEPENDENT_AMBULATORY_CARE_PROVIDER_SITE_OTHER): Payer: Medicare Other | Admitting: *Deleted

## 2020-04-21 DIAGNOSIS — I495 Sick sinus syndrome: Secondary | ICD-10-CM | POA: Diagnosis not present

## 2020-04-21 LAB — CUP PACEART REMOTE DEVICE CHECK
Battery Remaining Longevity: 151 mo
Battery Voltage: 3.06 V
Brady Statistic AP VP Percent: 0.39 %
Brady Statistic AP VS Percent: 96.56 %
Brady Statistic AS VP Percent: 0 %
Brady Statistic AS VS Percent: 3.05 %
Brady Statistic RA Percent Paced: 97.15 %
Brady Statistic RV Percent Paced: 0.39 %
Date Time Interrogation Session: 20210621081457
Implantable Lead Implant Date: 20110429
Implantable Lead Implant Date: 20201221
Implantable Lead Location: 753859
Implantable Lead Location: 753860
Implantable Lead Model: 5076
Implantable Pulse Generator Implant Date: 20201221
Lead Channel Impedance Value: 323 Ohm
Lead Channel Impedance Value: 418 Ohm
Lead Channel Impedance Value: 494 Ohm
Lead Channel Impedance Value: 741 Ohm
Lead Channel Pacing Threshold Amplitude: 0.5 V
Lead Channel Pacing Threshold Amplitude: 0.75 V
Lead Channel Pacing Threshold Pulse Width: 0.4 ms
Lead Channel Pacing Threshold Pulse Width: 0.4 ms
Lead Channel Sensing Intrinsic Amplitude: 2.375 mV
Lead Channel Sensing Intrinsic Amplitude: 2.375 mV
Lead Channel Sensing Intrinsic Amplitude: 8.5 mV
Lead Channel Sensing Intrinsic Amplitude: 8.5 mV
Lead Channel Setting Pacing Amplitude: 1.5 V
Lead Channel Setting Pacing Amplitude: 2.5 V
Lead Channel Setting Pacing Pulse Width: 0.4 ms
Lead Channel Setting Sensing Sensitivity: 1.2 mV

## 2020-04-23 NOTE — Progress Notes (Signed)
Remote pacemaker transmission.   

## 2020-06-01 DIAGNOSIS — R946 Abnormal results of thyroid function studies: Secondary | ICD-10-CM | POA: Diagnosis not present

## 2020-06-01 DIAGNOSIS — E782 Mixed hyperlipidemia: Secondary | ICD-10-CM | POA: Diagnosis not present

## 2020-06-01 DIAGNOSIS — E559 Vitamin D deficiency, unspecified: Secondary | ICD-10-CM | POA: Diagnosis not present

## 2020-06-03 DIAGNOSIS — E782 Mixed hyperlipidemia: Secondary | ICD-10-CM | POA: Diagnosis not present

## 2020-06-03 DIAGNOSIS — E559 Vitamin D deficiency, unspecified: Secondary | ICD-10-CM | POA: Diagnosis not present

## 2020-06-03 DIAGNOSIS — I10 Essential (primary) hypertension: Secondary | ICD-10-CM | POA: Diagnosis not present

## 2020-06-03 DIAGNOSIS — R000 Tachycardia, unspecified: Secondary | ICD-10-CM | POA: Diagnosis not present

## 2020-06-11 DIAGNOSIS — K4090 Unilateral inguinal hernia, without obstruction or gangrene, not specified as recurrent: Secondary | ICD-10-CM | POA: Diagnosis not present

## 2020-06-12 ENCOUNTER — Ambulatory Visit: Payer: Self-pay | Admitting: General Surgery

## 2020-06-12 NOTE — H&P (Signed)
History of Present Illness Ralene Ok MD; 06/11/2020 11:03 AM) The patient is a 86 year old male who presents with an inguinal hernia. Referred by: Dr. Ernie Hew Chief Complaint: Right inguinal hernia  Patient is an 86 year old male with a history of a pacemake and A. fib, SVT, sick sinus syndrome r, on eliquis, who comes in with a right inguinal hernia. He states he's had these are for several years. He states it is not currently bothering him. He states that he does do some woodworking. He tries to stay away from any heavy lifting. Patient's he does state that the hernia reduces when he lays down.  He's had no previous abdominal surgery   Dr. Sallyanne Kuster is his cardiologist    Past Surgical History (Tanisha A. Owens Shark, [place]; 06/11/2020 10:44 AM) No pertinent past surgical history   Diagnostic Studies History (Tanisha A. Owens Shark, [place]; 06/11/2020 10:44 AM) Colonoscopy  >10 years ago  Allergies (Tanisha A. Owens Shark, [place]; 06/11/2020 10:45 AM) No Known Drug Allergies  [06/11/2020]: Allergies Reconciled   Medication History (Tanisha A. Owens Shark, RMA; 06/11/2020 10:45 AM) Eliquis (5MG  Tablet, Oral) Active. Metoprolol Tartrate (25MG  Tablet, Oral) Active. Medications Reconciled  Social History (Tanisha A. Owens Shark, [place]; 06/11/2020 10:44 AM) Caffeine use  Coffee, Tea. No alcohol use  No drug use  Tobacco use  Former smoker.  Family History (Tanisha A. Owens Shark, [place]; 06/11/2020 10:44 AM) Depression  Sister. Diabetes Mellitus  Brother. Heart Disease  Brother. Melanoma  Daughter. Migraine Headache  Daughter.  Other Problems (Tanisha A. Owens Shark, [place]; 06/11/2020 10:44 AM) Arthritis  Enlarged Prostate  Gastric Ulcer  Hypercholesterolemia     Review of Systems Ralene Ok MD; 06/11/2020 11:01 AM) General Not Present- Appetite Loss, Chills, Fatigue, Fever, Night Sweats, Weight Gain and Weight Loss. Skin Not Present- Change in Wart/Mole, Dryness, Hives, Jaundice, New Lesions,  Non-Healing Wounds, Rash and Ulcer. HEENT Present- Wears glasses/contact lenses. Not Present- Earache, Hearing Loss, Hoarseness, Nose Bleed, Oral Ulcers, Ringing in the Ears, Seasonal Allergies, Sinus Pain, Sore Throat, Visual Disturbances and Yellow Eyes. Respiratory Not Present- Bloody sputum, Chronic Cough, Difficulty Breathing, Snoring and Wheezing. Breast Not Present- Breast Mass, Breast Pain, Nipple Discharge and Skin Changes. Gastrointestinal Not Present- Abdominal Pain, Bloating, Bloody Stool, Change in Bowel Habits, Chronic diarrhea, Constipation, Difficulty Swallowing, Excessive gas, Gets full quickly at meals, Hemorrhoids, Indigestion, Nausea, Rectal Pain and Vomiting. Male Genitourinary Present- Frequency and Impotence. Not Present- Blood in Urine, Change in Urinary Stream, Nocturia, Painful Urination, Urgency and Urine Leakage. Musculoskeletal Not Present- Back Pain, Joint Pain, Joint Stiffness, Muscle Pain, Muscle Weakness and Swelling of Extremities. Neurological Not Present- Decreased Memory, Fainting, Headaches, Numbness, Seizures, Tingling, Tremor, Trouble walking and Weakness. Psychiatric Not Present- Anxiety, Bipolar, Change in Sleep Pattern, Depression, Fearful and Frequent crying. Endocrine Not Present- Cold Intolerance, Excessive Hunger, Hair Changes, Heat Intolerance, Hot flashes and New Diabetes. Hematology Present- Blood Thinners. Not Present- Easy Bruising, Excessive bleeding, Gland problems, HIV and Persistent Infections. All other systems negative  Vitals (Tanisha A. Brown RMA; 06/11/2020 10:45 AM) 06/11/2020 10:45 AM Weight: 169.4 lb Height: 68in Body Surface Area: 1.9 m Body Mass Index: 25.76 kg/m  Temp.: 98.86F  Pulse: 79 (Regular)  BP: 124/72(Sitting, Left Arm, Standard)       Physical Exam Ralene Ok MD; 06/11/2020 11:03 AM) The physical exam findings are as follows: Note: Constitutional: No acute distress, conversant, appears stated  age  Eyes: Anicteric sclerae, moist conjunctiva, no lid lag  Neck: No thyromegaly, trachea midline, no cervical lymphadenopathy  Lungs: Clear  to auscultation biilaterally, normal respiratory effot  Cardiovascular: regular rate & rhythm, no murmurs, no peripheal edema, pedal pulses 2+  GI: Soft, no masses or hepatosplenomegaly, non-tender to palpation  MSK: Normal gait, no clubbing cyanosis, edema  Skin: No rashes, palpation reveals normal skin turgor  Psychiatric: Appropriate judgment and insight, oriented to person, place, and time  Abdomen Inspection Hernias - Right - Inguinal hernia - Reducible - Right.    Assessment & Plan Ralene Ok MD; 06/11/2020 11:05 AM) RIGHT INGUINAL HERNIA (K40.90) Impression: Patient is an 86 year old male, with a history of sick sinus syndrome, SVT, A. fib on Eliquis, who comes in with a right inguinal hernia. Patient states he is currently asymptomatic.   We will proceed to the OR for a lap right inguinal hernia repair with mesh at the patient has obtained cardiac clearance. I did discuss with him the risks and benefits of the procedure to include but not limited to: Infection, bleeding, damage to structures, possible need further surgery.  Patient was understanding and wished to proceed.

## 2020-06-15 ENCOUNTER — Other Ambulatory Visit: Payer: Self-pay

## 2020-06-15 ENCOUNTER — Telehealth: Payer: Self-pay

## 2020-06-15 ENCOUNTER — Encounter (HOSPITAL_COMMUNITY): Payer: Self-pay | Admitting: Emergency Medicine

## 2020-06-15 ENCOUNTER — Emergency Department (HOSPITAL_COMMUNITY)
Admission: EM | Admit: 2020-06-15 | Discharge: 2020-06-15 | Disposition: A | Discharge status: Home / Self Care | Payer: Medicare Other | Attending: Emergency Medicine | Admitting: Emergency Medicine

## 2020-06-15 DIAGNOSIS — Z79899 Other long term (current) drug therapy: Secondary | ICD-10-CM | POA: Diagnosis not present

## 2020-06-15 DIAGNOSIS — Y999 Unspecified external cause status: Secondary | ICD-10-CM | POA: Insufficient documentation

## 2020-06-15 DIAGNOSIS — Y929 Unspecified place or not applicable: Secondary | ICD-10-CM | POA: Diagnosis not present

## 2020-06-15 DIAGNOSIS — I10 Essential (primary) hypertension: Secondary | ICD-10-CM | POA: Diagnosis not present

## 2020-06-15 DIAGNOSIS — I4891 Unspecified atrial fibrillation: Secondary | ICD-10-CM | POA: Insufficient documentation

## 2020-06-15 DIAGNOSIS — Z7901 Long term (current) use of anticoagulants: Secondary | ICD-10-CM | POA: Insufficient documentation

## 2020-06-15 DIAGNOSIS — I2510 Atherosclerotic heart disease of native coronary artery without angina pectoris: Secondary | ICD-10-CM | POA: Diagnosis not present

## 2020-06-15 DIAGNOSIS — X58XXXA Exposure to other specified factors, initial encounter: Secondary | ICD-10-CM | POA: Insufficient documentation

## 2020-06-15 DIAGNOSIS — Y939 Activity, unspecified: Secondary | ICD-10-CM | POA: Diagnosis not present

## 2020-06-15 DIAGNOSIS — T63441A Toxic effect of venom of bees, accidental (unintentional), initial encounter: Secondary | ICD-10-CM | POA: Diagnosis not present

## 2020-06-15 DIAGNOSIS — T63481A Toxic effect of venom of other arthropod, accidental (unintentional), initial encounter: Secondary | ICD-10-CM

## 2020-06-15 NOTE — Telephone Encounter (Signed)
   [facility] Pre-operative Risk Assessment    Request for surgical clearance:  1. What type of surgery is being performed? LAP INGUINAL REPAIR W/MESH   2. When is this surgery scheduled? TBD   3. What type of clearance is required (medical clearance vs. Pharmacy clearance to hold med vs. Both)? BOTH  4. Are there any medications that need to be held prior to surgery and how long? PLAVIX   5. Practice name and name of physician performing surgery? [facility]  ATTN:DIANE   6. What is the office phone number? [phone]   7.   What is the office fax number? [phone]  8.   Anesthesia type (None, local, MAC, general) ? GENERAL

## 2020-06-15 NOTE — ED Triage Notes (Signed)
Pt bit on R ear this afternoon @ 1800 by flying insect. Pt reports he is allergic to bees, he states he has had no swelling, no shob, no oral edema.

## 2020-06-15 NOTE — Telephone Encounter (Signed)
° °  Primary Cardiologist: Sanda Klein, MD  Chart reviewed as part of pre-operative protocol coverage. Given past medical history and time since last visit, based on ACC/AHA guidelines, David Booth would be at acceptable risk for the planned procedure without further cardiovascular testing.   His Eliquis may be held for 48 hours prior to his procedure.  Please resume as soon as hemostasis is achieved.  I will route this recommendation to the requesting party via Epic fax function and remove from pre-op pool.  Please call with questions.  Jossie Ng. Pat Sires NP-C    06/15/2020, 1:54 PM [facility] [address] Office [phone] Fax [phone]

## 2020-06-15 NOTE — Telephone Encounter (Signed)
Milagros Loll. Marcott 86 year old male is requesting clearance for laparoscopic inguinal hernia repair with mesh.  May his Plavix be held for his procedure?  He was last seen in the clinic on December 30, 2019.  During that time he was doing well.  He denied exertional chest pain, dyspnea, PND, palpitations, and lower extremity edema.  He had received both of his Covid vaccines.  His PMH includes tachybradycardia syndrome, chronic right bundle branch block, left anterior fascicular block and long PR interval, status post dual-chamber PPM.  His medical history also includes hypertension, neuropathy, and CAD.  Please direct response to CV [facility] preop for.  Thank you for your help.  Jossie Ng. Parthenia Tellefsen NP-C    06/15/2020, 10:23 AM [address] Office [phone] Fax [phone]

## 2020-06-15 NOTE — Discharge Instructions (Signed)
Your history and exam today are consistent with a sting of some insect to your right ear.  We did find the stinger and removed it thus helping her discomfort.  You had no symptoms of allergic reaction for nearly 6 hours after the sting with the stinger still in place.  I do not suspect you will have a serious allergic reaction thus we agreed together to hold on antihistamines, steroids, or epinephrine use.  Please follow-up with your primary doctor and keep the thing site cleaned.  Please rest.  If any symptoms change or worsen, please return to the nearest emergency department.

## 2020-06-15 NOTE — Telephone Encounter (Signed)
Ok to hold Eliquis 2 days prior to procedure, restart when safe after.  Dr H (For Dr C)

## 2020-06-15 NOTE — ED Provider Notes (Signed)
[facility] EMERGENCY DEPARTMENT Provider Note   CSN: [id number] Arrival date & time: 06/15/20  2122     History Chief Complaint  Patient presents with  . Insect Bite    David Booth is a 86 y.o. male.  The history is provided by the patient and medical records. No language interpreter was used.  Animal Bite Contact animal:  Insect (insect sting to R ear) Location:  Head/neck Head/neck injury location:  R ear Time since incident:  5 hours Pain details:    Quality:  Stinging   Severity:  Moderate   Timing:  Constant   Progression:  Unchanged Incident location:  Outside Provoked: unprovoked   Notifications:  None Animal's rabies vaccination status:  Unknown Tetanus status:  Unknown Relieved by:  Nothing Worsened by:  Nothing Ineffective treatments:  None tried Associated symptoms: no fever, no numbness, no rash and no swelling        Past Medical History:  Diagnosis Date  . Coronary artery disease   . Hypertension   . Neuropathy   . Numbness in feet   . Pacemaker     Patient Active Problem List   Diagnosis Date Noted  . Paroxysmal atrial fibrillation ([place]) 11/25/2019  . Pacemaker battery depletion 10/21/2019  . Closed malleolar fracture, unspecified laterality, initial encounter 01/02/2018  . PAT (paroxysmal atrial tachycardia) ([place]) 05/12/2017  . Neuropathy   . Hereditary and idiopathic peripheral neuropathy 09/30/2014  . Numbness in feet   . Paresthesia 07/31/2014  . RBBB 04/08/2013  . SSS (sick sinus syndrome) ([employer]) 04/07/2013  . Pacemaker - Medtronic Revo, dual chamber, MRI-conditional, implanted April 2011 04/07/2013  . Erectile dysfunction 04/07/2013  . Pacemaker lead malfunction 04/07/2013    Past Surgical History:  Procedure Laterality Date  . LEAD REVISION/REPAIR N/A 10/21/2019   Procedure: LEAD REVISION/REPAIR;  Surgeon: Sanda Klein, MD;  Location: [facility];  Service: Cardiovascular;  Laterality: N/A;  .  pace maker    . PPM GENERATOR CHANGEOUT N/A 10/21/2019   Procedure: PPM GENERATOR CHANGEOUT;  Surgeon: Sanda Klein, MD;  Location: [facility];  Service: Cardiovascular;  Laterality: N/A;       Family History  Problem Relation Age of Onset  . Alzheimer's disease Mother   . Heart attack Father     Social History   Tobacco Use  . Smoking status: Never Smoker  . Smokeless tobacco: Never Used  Substance Use Topics  . Alcohol use: No    Alcohol/week: 0.0 standard drinks  . Drug use: No    Home Medications Prior to Admission medications   Medication Sig Start Date End Date Taking? Authorizing Provider  Alpha-Lipoic Acid 100 MG TABS Take 100 mg by mouth 3 (three) times daily.    Yes [provider]  apixaban (ELIQUIS) 5 MG TABS tablet Take 1 tablet (5 mg total) by mouth 2 (two) times daily. 12/06/19  Yes Croitoru, Mihai, MD  b complex vitamins tablet Take 1 tablet by mouth daily.   Yes [provider]  Cholecalciferol (VITAMIN D3) 2000 UNITS TABS Take 2,000 Units by mouth daily.    Yes [provider]  Coenzyme Q10 (CO Q 10) 100 MG CAPS Take 100 mg by mouth daily.    Yes [provider]  EPINEPHrine (EPI-PEN) 0.3 mg/0.3 mL DEVI Inject 0.3 mg into the muscle as needed for anaphylaxis.    Yes [provider]  Garlic 7416 MG CAPS Take 1,000 mg by mouth daily.  Yes [provider]  Glucosamine-Chondroit-Vit C-Mn (GLUCOSAMINE CHONDR 1500 COMPLX) CAPS Take 1 capsule by mouth 2 (two) times daily.   Yes [provider]  loratadine (CLARITIN) 10 MG tablet Take 10 mg by mouth daily.   Yes [provider]  Lycopene 10 MG CAPS Take 10 mg by mouth daily.    Yes [provider]  MAGNESIUM PO Take 1 Dose by mouth 2 (two) times daily. Powder form   Yes [provider]  Melatonin 10 MG CAPS Take 10 mg by mouth at bedtime.    Yes [provider]  metoprolol tartrate (LOPRESSOR) 25 MG tablet Take  0.5 tablets (12.5 mg total) by mouth 2 (two) times daily. 04/21/20 04/16/21 Yes Croitoru, Mihai, MD  Misc Natural Products (PROSTATE HEALTH) CAPS Take 1 capsule by mouth 3 (three) times daily.   Yes [provider]  Multiple Vitamin (MULTIVITAMIN) tablet Take 1 tablet by mouth daily.   Yes [provider]  Omega-3 Fatty Acids (FISH OIL) 1200 MG CAPS Take 1,200 mg by mouth daily.    Yes [provider]  sildenafil (VIAGRA) 50 MG tablet Take 1 tablet (50 mg total) by mouth daily as needed for erectile dysfunction. 02/25/20  Yes Croitoru, Mihai, MD  vitamin C (ASCORBIC ACID) 250 MG tablet Take 250 mg by mouth 3 (three) times daily.    Yes [provider]  vitamin E (VITAMIN E) 400 UNIT capsule Take 400 Units by mouth 3 (three) times daily.    Yes [provider]    Allergies    Bee venom  Review of Systems   Review of Systems  Constitutional: Negative for chills, diaphoresis, fatigue and fever.  HENT: Positive for ear pain. Negative for congestion, ear discharge and facial swelling.   Eyes: Negative for visual disturbance.  Respiratory: Negative for cough, chest tightness, shortness of breath and wheezing.   Cardiovascular: Negative for chest pain.  Gastrointestinal: Negative for abdominal pain, diarrhea, nausea and vomiting.  Genitourinary: Negative for flank pain.  Musculoskeletal: Negative for back pain.  Skin: Negative for rash.  Neurological: Negative for dizziness, light-headedness, numbness and headaches.  Psychiatric/Behavioral: Negative for agitation.  All other systems reviewed and are negative.   Physical Exam Updated Vital Signs BP (!) 143/67 (BP Location: Right Arm)   Pulse 61   Temp 98.5 F (36.9 C) (Oral)   Resp 16   Ht 5\' 9"  (1.753 m)   Wt 77 kg   SpO2 98%   BMI 25.07 kg/m   Physical Exam Vitals and nursing note reviewed.  Constitutional:      General: He is not in acute distress.    Appearance: He is well-developed.  He is not ill-appearing, toxic-appearing or diaphoretic.  HENT:     Right Ear: Tenderness present. No laceration, drainage or swelling. A foreign body (in ear tissue) is present.     Ears:      Nose: No congestion or rhinorrhea.     Mouth/Throat:     Pharynx: No oropharyngeal exudate.  Eyes:     Extraocular Movements: Extraocular movements intact.     Conjunctiva/sclera: Conjunctivae normal.     Pupils: Pupils are equal, round, and reactive to light.  Cardiovascular:     Rate and Rhythm: Normal rate and regular rhythm.     Pulses: Normal pulses.     Heart sounds: No murmur heard.   Pulmonary:     Effort: Pulmonary effort is normal. No respiratory distress.     Breath  sounds: Normal breath sounds. No wheezing, rhonchi or rales.  Chest:     Chest wall: No tenderness.  Abdominal:     General: Abdomen is flat.     Palpations: Abdomen is soft.     Tenderness: There is no abdominal tenderness. There is no right CVA tenderness, left CVA tenderness, guarding or rebound.  Musculoskeletal:        General: Tenderness present.     Cervical back: Neck supple. No tenderness.     Right lower leg: No edema.     Left lower leg: No edema.  Skin:    General: Skin is warm and dry.     Capillary Refill: Capillary refill takes less than 2 seconds.     Findings: No erythema or rash.  Neurological:     Mental Status: He is alert.  Psychiatric:        Mood and Affect: Mood normal.     ED Results / Procedures / Treatments   Labs (all labs ordered are listed, but only abnormal results are displayed) Labs Reviewed - No data to display  EKG None  Radiology No results found.  Procedures .Foreign Body Removal  Date/Time: 06/15/2020 11:31 PM Performed by: Courtney Paris, MD Authorized by: Courtney Paris, MD  Body area: ear Location details: right ear  Sedation: Patient sedated: no  Patient restrained: no Patient cooperative: yes Localization method:  visualized Complexity: simple 1 objects recovered. Objects recovered: stinger Post-procedure assessment: foreign body removed Patient tolerance: patient tolerated the procedure well with no immediate complications   (including critical care time)  Medications Ordered in ED Medications - No data to display  ED Course  I have reviewed the triage vital signs and the nursing notes.  Pertinent labs & imaging results that were available during my care of the patient were reviewed by me and considered in my medical decision making (see chart for details).    MDM Rules/Calculators/A&P                          David Booth is a 86 y.o. male with a past medical history significant for CAD, hypertension, pacemaker, and known allergy to bee stings who presents with an insect sting to his right ear.  He reports that approximately 6 PM today, 5 and half hours ago, patient was walking outside and a an insect hit him in the right ear.  He reports immediate onset of pain and it has been persistent ever since.  He says that it is mildly improved from before and he has not had any allergic reaction symptoms.  He reports that he has never needed an EpiPen but has 1.  He reports he waited for symptoms but never had any rash, itching, throat swelling, lip swelling, tongue swelling, hoarseness, nausea, diarrhea, vomiting, wheezing, shortness of breath, or any other symptoms.  He denied any pruritus.  He reports he simply still has pain hours after being stung.  He is concerned that the reaction might develop since it still hurts.  He denies any preceding symptoms or other complaints.  On exam, patient does have what appears to be a small stinger with the venom sac stuck to his right lateral ear.  It is tender but there is no significant erythema or swelling.  Lungs clear with no wheezing.  Chest nontender.  Abdomen nontender.  No urticarial rash.  Oropharyngeal exam unremarkable.  Patient otherwise  well-appearing.  The foreign body stinger  was removed without difficulty and patient started feeling better.  It was cleaned following this.  Suspect there was no allergic reaction to this sting.  Patient had no symptoms so we agreed to hold on antihistamines or steroids.  Patient has an EpiPen at home.  He will follow up with his PCP and understands return precautions.  He had no other questions or concerns and was discharged after stinger removal and feeling better.   Final Clinical Impression(s) / ED Diagnoses Final diagnoses:  Insect stings, accidental or unintentional, initial encounter    Rx / DC Orders ED Discharge Orders    None     Clinical Impression: 1. Insect stings, accidental or unintentional, initial encounter     Disposition: Discharge  Condition: Good  I have discussed the results, Dx and Tx plan with the pt(& family if present). He/she/they expressed understanding and agree(s) with the plan. Discharge instructions discussed at great length. Strict return precautions discussed and pt &/or family have verbalized understanding of the instructions. No further questions at time of discharge.    New Prescriptions   No medications on file    Follow Up: [name], [address] [place] [address] [phone]     [place] [address] [id number] mc [place]       Breonia Kirstein, Gwenyth Allegra, MD 06/15/20 [phone]

## 2020-07-02 DIAGNOSIS — Z23 Encounter for immunization: Secondary | ICD-10-CM | POA: Diagnosis not present

## 2020-07-13 ENCOUNTER — Other Ambulatory Visit (HOSPITAL_COMMUNITY): Payer: Medicare Other

## 2020-07-21 ENCOUNTER — Ambulatory Visit (INDEPENDENT_AMBULATORY_CARE_PROVIDER_SITE_OTHER): Payer: Medicare Other | Admitting: *Deleted

## 2020-07-21 DIAGNOSIS — I495 Sick sinus syndrome: Secondary | ICD-10-CM

## 2020-07-21 LAB — CUP PACEART REMOTE DEVICE CHECK
Battery Remaining Longevity: 148 mo
Battery Voltage: 3.04 V
Brady Statistic AP VP Percent: 0.36 %
Brady Statistic AP VS Percent: 97.43 %
Brady Statistic AS VP Percent: 0 %
Brady Statistic AS VS Percent: 2.2 %
Brady Statistic RA Percent Paced: 98.2 %
Brady Statistic RV Percent Paced: 0.37 %
Date Time Interrogation Session: 20210921000023
Implantable Lead Implant Date: 20110429
Implantable Lead Implant Date: 20201221
Implantable Lead Location: 753859
Implantable Lead Location: 753860
Implantable Lead Model: 5076
Implantable Pulse Generator Implant Date: 20201221
Lead Channel Impedance Value: 323 Ohm
Lead Channel Impedance Value: 437 Ohm
Lead Channel Impedance Value: 475 Ohm
Lead Channel Impedance Value: 722 Ohm
Lead Channel Pacing Threshold Amplitude: 0.625 V
Lead Channel Pacing Threshold Amplitude: 0.75 V
Lead Channel Pacing Threshold Pulse Width: 0.4 ms
Lead Channel Pacing Threshold Pulse Width: 0.4 ms
Lead Channel Sensing Intrinsic Amplitude: 2 mV
Lead Channel Sensing Intrinsic Amplitude: 2 mV
Lead Channel Sensing Intrinsic Amplitude: 7.5 mV
Lead Channel Sensing Intrinsic Amplitude: 7.5 mV
Lead Channel Setting Pacing Amplitude: 1.5 V
Lead Channel Setting Pacing Amplitude: 2.5 V
Lead Channel Setting Pacing Pulse Width: 0.4 ms
Lead Channel Setting Sensing Sensitivity: 1.2 mV

## 2020-07-24 NOTE — Progress Notes (Signed)
Remote pacemaker transmission.   

## 2020-08-03 ENCOUNTER — Encounter: Payer: Self-pay | Admitting: Cardiovascular Disease

## 2020-08-03 NOTE — Progress Notes (Unsigned)
PERIOPERATIVE PRESCRIPTION FOR IMPLANTED CARDIAC DEVICE PROGRAMMING  Patient Information: Name:  David Booth  DOB:  [birth year]  MRN:  [id number]  {TIP - You do not have to delete this tip  -  Copy the info from the staff message sent by the PAT staff  then press F2 here and paste the information using CTL - V on the next line :[id number]}  Planned Procedure: Laparoscopic Right Inguinal Hernia Repair with Mesh  Surgeon: Dr. Ralene Ok  Date of Procedure: 08/13/20  Cautery will be used.  Position during surgery: Supine   Please send documentation back to:  [facility] (Fax # [phone])   Bobbie Stack, RN  08/03/2020 2:34 PM   Device Information:  Clinic EP Physician:  Dr. Dani Gobble Croitoru   Device Type:  Pacemaker Manufacturer and Phone #:  Medtronic: [phone] Pacemaker Dependent?:  No. Date of Last Device Check:  07/21/20 Normal Device Function?:  Yes.    Electrophysiologist's Recommendations:   Have magnet available.  Provide continuous ECG monitoring when magnet is used or reprogramming is to be performed.   Procedure should not interfere with device function.  No device programming or magnet placement needed.  Per Device Clinic Standing Orders, York Ram, RN  5:24 PM 08/03/2020

## 2020-08-03 NOTE — Pre-Procedure Instructions (Addendum)
[id number] David Booth, [place] [address] Phone: [phone] Fax: [phone]    Your procedure is scheduled on Thurs., Oct. 14, 2021 from 7:30AM-8:30AM  Report to [facility] Entrance "A" at 5:30AM  Call this number if you have problems the morning of surgery:  [phone]   Remember:  Do not eat after midnight on Oct. 13th  You may drink clear liquids until 3 hours (4:30AM) prior to surgery time .  Clear liquids allowed are: Water, Juice (no pulp), Clear Tea (no dairy or creamer), Black Coffee Only (no dairy or creamer), Carbonated Beverages, Gatorade, Plain Jell-O, Plain Popsicles.  Please complete your PRE-SURGERY ENSURE drink that was provided to you by 4:30AM the morning of surgery.  Please, if able, drink it in one setting. DO NOT SIP.     Take these medicines the morning of surgery with A SIP OF WATER: Metoprolol tartrate (LOPRESSOR)      Oxymetazoline HCl Eye drops      If Needed: EPINEPHrine (EPI-PEN)  Follow your surgeon's instructions on when to stop Eliquis.  If no instructions were given by your surgeon then you will need to call the office to get those instructions.    7 days before surgery (08/06/20), stop taking all Aspirin (unless instructed by your doctor) and Other Aspirin containing products, Vitamins, Fish oils, and Herbal medications. Also stop all NSAIDS i.e. Advil, Ibuprofen, Motrin, Aleve, Anaprox, Naproxen, BC, Goody Powders, and all Supplements.  No Smoking of any kind, Tobacco/Vaping, or Alcohol products 24 hours prior to your procedure. If you use a Cpap at night, you may bring all equipment for your overnight stay.   Special instructions:   [place]- Preparing For Surgery  Before surgery, you can play an important role. Because skin is not sterile, your skin needs to be as free of germs as possible. You can reduce the number of germs on your skin by washing with CHG  (chlorahexidine gluconate) Soap before surgery.  CHG is an antiseptic cleaner which kills germs and bonds with the skin to continue killing germs even after washing.    Please do not use if you have an allergy to CHG or antibacterial soaps. If your skin becomes reddened/irritated stop using the CHG.  Do not shave (including legs and underarms) for at least 48 hours prior to first CHG shower. It is OK to shave your face.  Please follow these instructions carefully.   1. Shower the NIGHT BEFORE SURGERY and the MORNING OF SURGERY with CHG.   2. If you chose to wash your hair, wash your hair first as usual with your normal shampoo.  3. After you shampoo, rinse your hair and body thoroughly to remove the shampoo.  4. Use CHG as you would any other liquid soap. You can apply CHG directly to the skin and wash gently with a scrungie or a clean washcloth.   5. Apply the CHG Soap to your body ONLY FROM THE NECK DOWN.  Do not use on open wounds or open sores. Avoid contact with your eyes, ears, mouth and genitals (private parts). Wash Face and genitals (private parts)  with your normal soap.  6. Wash thoroughly, paying special attention to the area where your surgery will be performed.  7. Thoroughly rinse your body with warm water from the neck down.  8. DO NOT shower/wash with your normal soap after using and rinsing off the CHG Soap.  9. Pat yourself dry with  a CLEAN TOWEL.  10. Wear CLEAN PAJAMAS to bed the night before surgery, wear comfortable clothes the morning of surgery  11. Place CLEAN SHEETS on your bed the night of your first shower and DO NOT SLEEP WITH PETS.   Day of Surgery:             Remember to brush your teeth WITH YOUR REGULAR TOOTHPASTE.  Do not wear jewelry.  Do not wear lotions, powders, colognes, or deodorant.  Do not shave 48 hours prior to surgery.  Men may shave face and neck.  Do not bring valuables to the hospital.  [facility] is not responsible for any  belongings or valuables.  Contacts, dentures or bridgework may not be worn into surgery.    For patients admitted to the hospital, discharge time will be determined by your treatment team.  Patients discharged the day of surgery will not be allowed to drive home, and someone age 33 and over needs to stay with them for 24 hours.  Please wear clean clothes to the hospital/surgery center.    Please read over the following fact sheets that you were given.

## 2020-08-04 ENCOUNTER — Encounter (HOSPITAL_COMMUNITY): Payer: Self-pay

## 2020-08-04 ENCOUNTER — Encounter (HOSPITAL_COMMUNITY)
Admission: RE | Admit: 2020-08-04 | Discharge: 2020-08-04 | Disposition: A | Discharge status: Home / Self Care | Payer: Medicare Other | Source: Ambulatory Visit | Attending: General Surgery | Admitting: General Surgery

## 2020-08-04 ENCOUNTER — Other Ambulatory Visit: Payer: Self-pay

## 2020-08-04 DIAGNOSIS — I2510 Atherosclerotic heart disease of native coronary artery without angina pectoris: Secondary | ICD-10-CM | POA: Insufficient documentation

## 2020-08-04 DIAGNOSIS — Z950 Presence of cardiac pacemaker: Secondary | ICD-10-CM | POA: Diagnosis not present

## 2020-08-04 DIAGNOSIS — I480 Paroxysmal atrial fibrillation: Secondary | ICD-10-CM | POA: Insufficient documentation

## 2020-08-04 DIAGNOSIS — Z7901 Long term (current) use of anticoagulants: Secondary | ICD-10-CM | POA: Insufficient documentation

## 2020-08-04 DIAGNOSIS — Z79899 Other long term (current) drug therapy: Secondary | ICD-10-CM | POA: Diagnosis not present

## 2020-08-04 DIAGNOSIS — I10 Essential (primary) hypertension: Secondary | ICD-10-CM | POA: Insufficient documentation

## 2020-08-04 DIAGNOSIS — Z87891 Personal history of nicotine dependence: Secondary | ICD-10-CM | POA: Insufficient documentation

## 2020-08-04 DIAGNOSIS — K4090 Unilateral inguinal hernia, without obstruction or gangrene, not specified as recurrent: Secondary | ICD-10-CM | POA: Diagnosis not present

## 2020-08-04 DIAGNOSIS — G629 Polyneuropathy, unspecified: Secondary | ICD-10-CM | POA: Insufficient documentation

## 2020-08-04 DIAGNOSIS — Z01812 Encounter for preprocedural laboratory examination: Secondary | ICD-10-CM | POA: Diagnosis not present

## 2020-08-04 HISTORY — DX: Unspecified osteoarthritis, unspecified site: M19.90

## 2020-08-04 HISTORY — DX: Paroxysmal atrial fibrillation: I48.0

## 2020-08-04 LAB — CBC
HCT: 44.6 % (ref 39.0–52.0)
Hemoglobin: 14.2 g/dL (ref 13.0–17.0)
MCH: 29.7 pg (ref 26.0–34.0)
MCHC: 31.8 g/dL (ref 30.0–36.0)
MCV: 93.3 fL (ref 80.0–100.0)
Platelets: 125 10*3/uL — ABNORMAL LOW (ref 150–400)
RBC: 4.78 MIL/uL (ref 4.22–5.81)
RDW: 13.2 % (ref 11.5–15.5)
WBC: 5.2 10*3/uL (ref 4.0–10.5)
nRBC: 0 % (ref 0.0–0.2)

## 2020-08-04 LAB — BASIC METABOLIC PANEL
Anion gap: 10 (ref 5–15)
BUN: 18 mg/dL (ref 8–23)
CO2: 25 mmol/L (ref 22–32)
Calcium: 9.4 mg/dL (ref 8.9–10.3)
Chloride: 106 mmol/L (ref 98–111)
Creatinine, Ser: 0.98 mg/dL (ref 0.61–1.24)
GFR calc non Af Amer: >60 mL/min (ref >60)
Glucose, Bld: 97 mg/dL (ref 70–99)
Potassium: 4.1 mmol/L (ref 3.5–5.1)
Sodium: 141 mmol/L (ref 135–145)

## 2020-08-04 NOTE — Progress Notes (Signed)
LVM for David Booth with [employer] to give up-coming surgery information, as his email address was not accepting the information x2.

## 2020-08-04 NOTE — Progress Notes (Signed)
PCP - Dr. Ernie Hew  Cardiologist - Dr. Sallyanne Kuster C.C. 06/15/20 (E)  Chest x-ray - 10/22/2019 (E)  EKG - 11/25/19 (E)  Stress Test - 11/25/09 (E)  ECHO - 01/05/10 (E)  Cardiac Cath - 12/11/00 (E)  AICD-na PM-[employer]- Device Orders sent/returned (E)/ Rep called LOOP-na  Sleep Study - Yes- Negative CPAP - Denies  LABS- 08/04/20: CBC, BMP 08/10/20: COVID  ASA- Denies Eliquis- LD- 10/11  ERAS- Yes- Ensure x1  HA1C- Denies  Anesthesia- Yes- cardiac clearance  Pt denies having chest pain, sob, or fever at this time. All instructions explained to the pt, with a verbal understanding of the material. Pt agrees to go over the instructions while at home for a better understanding. Pt also instructed to self quarantine after being tested for COVID-19. The opportunity to ask questions was provided.   Coronavirus Screening  Have you experienced the following symptoms:  Cough yes/no: No Fever (>100.33F)  yes/no: No Runny nose yes/no: No Sore throat yes/no: No Difficulty breathing/shortness of breath  yes/no: No  Have you or a family member traveled in the last 14 days and where? yes/no: No   If the patient indicates "YES" to the above questions, their PAT will be rescheduled to limit the exposure to others and, the surgeon will be notified. THE PATIENT WILL NEED TO BE ASYMPTOMATIC FOR 14 DAYS.   If the patient is not experiencing any of these symptoms, the PAT nurse will instruct them to NOT bring anyone with them to their appointment since they may have these symptoms or traveled as well.   Please remind your patients and families that hospital visitation restrictions are in effect and the importance of the restrictions.

## 2020-08-05 ENCOUNTER — Encounter (HOSPITAL_COMMUNITY): Payer: Self-pay

## 2020-08-05 ENCOUNTER — Encounter (HOSPITAL_COMMUNITY): Payer: Self-pay | Admitting: Anesthesiology

## 2020-08-05 ENCOUNTER — Encounter (HOSPITAL_COMMUNITY): Payer: Self-pay | Admitting: Vascular Surgery

## 2020-08-05 NOTE — Progress Notes (Signed)
Anesthesia Chart Review:  Case: [id number] Date/Time: 08/13/20 0715   Procedure: LAPAROSCOPIC RIGHT INGUINAL HERNIA REPAIR WITH MESH (Right )   Anesthesia type: General   Pre-op diagnosis: RIGHT INGUINAL HERNIA   Location: [facility] OR ROOM 01 / [place] OR   Surgeons: Ralene Ok, MD      DISCUSSION: Patient is an 86 year old male scheduled for the above procedure.  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 Sallyanne Kuster, "He does have an abandoned/5086 RV lead and therefore his pacemaker system is not MRI conditional.  To reprogram the right ventricular output to chronic levels later this month."), PAF, neuropathy.  Preoperative cardiology input outlined by Coletta Memos, NP on 06/15/2020: "Given past medical history and time since last visit, based on ACC/AHA guidelines, David Booth would be at acceptable risk for the planned procedure without further cardiovascular testing.   His Eliquis may be held for 48 hours prior to his procedure.  Please resume as soon as hemostasis is achieved."  Reported last Eliquis scheduled for 08/10/2020.  Perioperative cardiac device prescription: Device Type:  Pacemaker Manufacturer and Phone #:  Medtronic: [phone] Pacemaker Dependent?:  No. Date of Last Device Check:  07/21/20           Normal Device Function?:  Yes.    Electrophysiologist's Recommendations:  Have magnet available.  Provide continuous ECG monitoring when magnet is used or reprogramming is to be performed.   Procedure should not interfere with device function.  No device programming or magnet placement needed.  Received second [place] COVID-19 vaccine on 01/02/2020.  Presurgical COVID-19 test is scheduled for 08/10/2020.  Anesthesia team to evaluate on the day of surgery.   VS: BP (!) 148/87   Pulse 77   Temp 36.7 C (Oral)    Resp 19   Ht 5\' 9"  (1.753 m)   Wt 76.5 kg   SpO2 97%   BMI 24.91 kg/m    PROVIDERS: [name], MD is PCP  - Alyson Locket,, MD is cardiologist.  Last office visit 12/30/2019.  Has Medtronic PPM.  Known trifascicular block (RBBB, LAFB, prolonged PR interval).  PPM function normal at that time (96% atrial pacing, 0.2% ventricular pacing) with burden of arrhythmia very low. CHA2DS2-VASc score at least 2.  He was tolerating anticoagulation.  His underlying rhythm is sinus bradycardia around 40 bpm.  Patient prefers a slower heart rate and felt better with a less aggressive rate response sensor.  20-month office follow-up planned.   LABS: Labs reviewed: Acceptable for surgery. (all labs ordered are listed, but only abnormal results are displayed)  Labs Reviewed  CBC - Abnormal; Notable for the following components:      Result Value   Platelets 125 (*)    All other components within normal limits  BASIC METABOLIC PANEL     IMAGES: CXR 10/22/19: FINDINGS: Cardiac pacer with lead tips over the right atrium right ventricle. Mild cardiomegaly. No pulmonary venous congestion. Low lung volumes. Small left pleural effusion cannot be excluded. No pneumothorax. IMPRESSION: 1. Cardiac pacer with lead tips over the right atrium and right ventricle. No pneumothorax. Mild cardiomegaly. No pulmonary venous congestion. 2. Low lung volumes. Small left pleural effusion cannot be excluded.   EKG: 11/28/19 ([facility]): Atrial paced rhythm with prolonged AV conduction Left axis deviation Right bundle branch block and left anterior fascicular block Inferior infarct, age undetermined  CV: Nuclear stress test 11/25/09: Impression: Normal myocardial perfusion scan demonstrating an attenuation artifact in the inferior region of the myocardium.  No ischemia or infarct/scan is seen in the remaining myocardium.  No prior study available for comparison.  No significant ischemia  demonstrated.  Post-rest EF 59%.  Global LV systolic function is normal.  This is a low risk scan.  Echo 01/05/10: Summary: There is mild asymmetric left ventricular hypertrophy.  Left ventricular systolic function is normal.  EF > 55%.  The right ventricle is mildly dilated.  The right ventricular systolic function is borderline reduced.  The LA is mildly dilated.  The right atrium is mild to moderately dilated.  There is moderate tricuspid regurgitation.  RVSP is elevated at 41 mmHg.  There is mild to moderate pulmonary hypertension.  No aortic stenosis.  Trace aortic regurgitation.  Cardiac cath 12/11/00: FINAL DIAGNOSES: 1. Angiographically patent coronary arteries. 2. False positive EKG test for ischemia (clinical diagnosis). 3. Normal left ventricular systolic function, 94%. 4. Normal renal arteries and abdominal aorta.   Past Medical History:  Diagnosis Date  . Arthritis   . Coronary artery disease    patent coronary arteries 12/11/00 LHC  . Hypertension   . Neuropathy   . Numbness in feet   . Pacemaker   . PAF (paroxysmal atrial fibrillation) ([place])     Past Surgical History:  Procedure Laterality Date  . CARDIAC CATHETERIZATION     10 + years  . LEAD REVISION/REPAIR N/A 10/21/2019   Procedure: LEAD REVISION/REPAIR;  Surgeon: Sanda Klein, MD;  Location: [place] [facility];  Service: Cardiovascular;  Laterality: N/A;  . pace maker    . PPM GENERATOR CHANGEOUT N/A 10/21/2019   Procedure: PPM GENERATOR CHANGEOUT;  Surgeon: Sanda Klein, MD;  Location: [place] [facility];  Service: Cardiovascular;  Laterality: N/A;    MEDICATIONS: . Alpha-Lipoic Acid 100 MG TABS  . apixaban (ELIQUIS) 5 MG TABS tablet  . b complex vitamins tablet  . Cholecalciferol (VITAMIN D3) 2000 UNITS TABS  . Coenzyme Q10 (CO Q 10) 100 MG CAPS  . EPINEPHrine (EPI-PEN) 0.3 mg/0.3 mL DEVI  . Garlic 7654 MG CAPS  . Glucosamine-Chondroit-Vit C-Mn (GLUCOSAMINE CHONDR 1500 COMPLX) CAPS  . loratadine  (CLARITIN) 10 MG tablet  . Lycopene 10 MG CAPS  . MAGNESIUM PO  . Melatonin 10 MG CAPS  . metoprolol tartrate (LOPRESSOR) 25 MG tablet  . Misc Natural Products (PROSTATE HEALTH) CAPS  . Multiple Vitamin (MULTIVITAMIN WITH MINERALS) TABS tablet  . Omega-3 Fatty Acids (FISH OIL) 1200 MG CAPS  . Oxymetazoline HCl (UPNEEQ) 0.1 % SOLN  . sildenafil (VIAGRA) 50 MG tablet  . vitamin C (ASCORBIC ACID) 250 MG tablet  . vitamin E (VITAMIN E) 400 UNIT capsule   No current facility-administered medications for this encounter.    Myra Gianotti, PA-C Surgical Short Stay/Anesthesiology [facility] Phone [phone] [facility] Phone [phone] 08/05/2020 4:03 PM

## 2020-08-05 NOTE — Anesthesia Preprocedure Evaluation (Deleted)
Anesthesia Evaluation    Airway        Dental   Pulmonary former smoker,           Cardiovascular hypertension,      Neuro/Psych    GI/Hepatic   Endo/Other    Renal/GU      Musculoskeletal   Abdominal   Peds  Hematology   Anesthesia Other Findings   Reproductive/Obstetrics                             Anesthesia Physical Anesthesia Plan  ASA:   Anesthesia Plan:    Post-op Pain Management:    Induction:   PONV Risk Score and Plan:   Airway Management Planned:   Additional Equipment:   Intra-op Plan:   Post-operative Plan:   Informed Consent:   Plan Discussed with:   Anesthesia Plan Comments: (PAT note written 08/05/2020 by Myra Gianotti, PA-C. )        Anesthesia Quick Evaluation

## 2020-08-05 NOTE — Progress Notes (Signed)
Emailed sent to Gae Dry, the current rep for medtronic regarding this pt's up-coming surgery, as Johnney Killian is no longer the person of contact.

## 2020-08-10 ENCOUNTER — Other Ambulatory Visit (HOSPITAL_COMMUNITY)
Admission: RE | Admit: 2020-08-10 | Discharge: 2020-08-10 | Disposition: A | Discharge status: Home / Self Care | Payer: Medicare Other | Source: Ambulatory Visit | Attending: General Surgery | Admitting: General Surgery

## 2020-08-10 DIAGNOSIS — Z01812 Encounter for preprocedural laboratory examination: Secondary | ICD-10-CM | POA: Insufficient documentation

## 2020-08-10 DIAGNOSIS — Z20822 Contact with and (suspected) exposure to covid-19: Secondary | ICD-10-CM | POA: Insufficient documentation

## 2020-08-10 LAB — SARS CORONAVIRUS 2 (TAT 6-24 HRS): SARS Coronavirus 2: NEGATIVE

## 2020-08-13 ENCOUNTER — Encounter (HOSPITAL_COMMUNITY)
Admission: RE | Disposition: A | Discharge status: Home / Self Care | Payer: Self-pay | Source: Home / Self Care | Attending: General Surgery

## 2020-08-13 ENCOUNTER — Ambulatory Visit (HOSPITAL_COMMUNITY)
Admission: RE | Admit: 2020-08-13 | Discharge: 2020-08-13 | Disposition: A | Discharge status: Home / Self Care | Payer: Medicare Other | Attending: General Surgery | Admitting: General Surgery

## 2020-08-13 SURGERY — REPAIR, HERNIA, INGUINAL, LAPAROSCOPIC
Anesthesia: General | Laterality: Right

## 2020-08-26 DIAGNOSIS — Z23 Encounter for immunization: Secondary | ICD-10-CM | POA: Diagnosis not present

## 2020-09-28 ENCOUNTER — Encounter (HOSPITAL_COMMUNITY): Payer: Self-pay

## 2020-09-28 NOTE — Pre-Procedure Instructions (Signed)
David Booth  09/28/2020    Your procedure is scheduled on Tuesday, December 7.  Report to [facility], Main Entrance or Entrance "A" at 5:30 AM.                Your surgery or procedure is scheduled to begin at 7:30 AM   Call this number if you have problems the morning of surgery: [phone]  This is the number for the Pre- Surgical Desk.                For any other questions, please call [phone], Monday - Friday 8 AM - 4 PM.   Remember:  Do not eat or drink after midnight.   Take these medicines the morning of surgery with A SIP OF WATER : EPINEPHrine (EPI-PEN) if needed.  Follow your surgeon's instructions regarding  apixaban (ELIQUIS).  1 Week prior to surgery STOP taking Aspirin, Aspirin Products (Goody Powder, Excedrin Migraine), Ibuprofen (Advil), Naproxen (Aleve), Vitamins and Herbal Products (ie Fish Oil).    Special instructions:    [place]- Preparing For Surgery  Before surgery, you can play an important role. Because skin is not sterile, your skin needs to be as free of germs as possible. You can reduce the number of germs on your skin by washing with CHG (chlorahexidine gluconate) Soap before surgery.  CHG is an antiseptic cleaner which kills germs and bonds with the skin to continue killing germs even after washing.    Oral Hygiene is also important to reduce your risk of infection.  Remember - BRUSH YOUR TEETH THE MORNING OF SURGERY WITH YOUR REGULAR TOOTHPASTE  Please do not use if you have an allergy to CHG or antibacterial soaps. If your skin becomes reddened/irritated stop using the CHG.  Do not shave (including legs and underarms) for at least 48 hours prior to first CHG shower. It is OK to shave your face.  Please follow these instructions carefully.   1. Shower the NIGHT BEFORE SURGERY and the MORNING OF SURGERY with CHG.   2. If you chose to wash your hair, wash your hair first as usual with your normal shampoo.  3. After you  shampoo, wash your face and private area with the soap you use at home, then rinse your hair and body thoroughly to remove the shampoo and soap.  4. Use CHG as you would any other liquid soap. You can apply CHG directly to the skin and wash gently with a scrungie or a clean washcloth.   5. Apply the CHG Soap to your body ONLY FROM THE NECK DOWN.  Do not use on open wounds or open sores. Avoid contact with your eyes, ears, mouth and genitals (private parts).   6. Wash thoroughly, paying special attention to the area where your surgery will be performed.  7. Thoroughly rinse your body with warm water from the neck down.  8. DO NOT shower/wash with your normal soap after using and rinsing off the CHG Soap.  9. Pat yourself dry with a CLEAN TOWEL.  10. Wear CLEAN PAJAMAS to bed the night before surgery, wear comfortable clothes the morning of surgery  11. Place CLEAN SHEETS on your bed the night of your first shower and DO NOT SLEEP WITH PETS.  Day of Surgery: Shower as instructed above. Do not apply any deodorants/lotions, powders or colognes.  Please wear clean clothes to the hospital/surgery center.   Remember to brush your teeth WITH YOUR REGULAR  TOOTHPASTE.  Do not wear jewelry, make-up or nail polish.  Do not shave 48 hours prior to surgery.  Men may shave face and neck.  Do not bring valuables to the hospital.  [facility] is not responsible for any belongings or valuables.  Contacts, dentures or bridgework may not be worn into surgery.  Leave your suitcase in the car.  After surgery it may be brought to your room.  For patients admitted to the hospital, discharge time will be determined by your treatment team.  Patients discharged the day of surgery will not be allowed to drive home.   Please read over the fact sheets that you were given.

## 2020-09-28 NOTE — Progress Notes (Signed)
David Booth has a  Medtronic pacemaker. I sent a request for orders via Epic. I sent an Email to Renae Fickle and Jovita Kussmaul, RN.

## 2020-09-29 ENCOUNTER — Other Ambulatory Visit: Payer: Self-pay

## 2020-09-29 ENCOUNTER — Encounter (HOSPITAL_COMMUNITY)
Admission: RE | Admit: 2020-09-29 | Discharge: 2020-09-29 | Disposition: A | Discharge status: Home / Self Care | Payer: Medicare Other | Source: Ambulatory Visit | Attending: General Surgery | Admitting: General Surgery

## 2020-09-29 ENCOUNTER — Ambulatory Visit: Payer: Self-pay | Admitting: General Surgery

## 2020-09-29 ENCOUNTER — Encounter (HOSPITAL_COMMUNITY): Payer: Self-pay

## 2020-09-29 DIAGNOSIS — Z01812 Encounter for preprocedural laboratory examination: Secondary | ICD-10-CM | POA: Insufficient documentation

## 2020-09-29 LAB — CBC
HCT: 43.7 % (ref 39.0–52.0)
Hemoglobin: 14.3 g/dL (ref 13.0–17.0)
MCH: 30.7 pg (ref 26.0–34.0)
MCHC: 32.7 g/dL (ref 30.0–36.0)
MCV: 93.8 fL (ref 80.0–100.0)
Platelets: 125 10*3/uL — ABNORMAL LOW (ref 150–400)
RBC: 4.66 MIL/uL (ref 4.22–5.81)
RDW: 13.2 % (ref 11.5–15.5)
WBC: 4.5 10*3/uL (ref 4.0–10.5)
nRBC: 0 % (ref 0.0–0.2)

## 2020-09-29 LAB — BASIC METABOLIC PANEL
Anion gap: 10 (ref 5–15)
BUN: 14 mg/dL (ref 8–23)
CO2: 27 mmol/L (ref 22–32)
Calcium: 9.5 mg/dL (ref 8.9–10.3)
Chloride: 104 mmol/L (ref 98–111)
Creatinine, Ser: 1.07 mg/dL (ref 0.61–1.24)
GFR, Estimated: >60 mL/min (ref >60)
Glucose, Bld: 95 mg/dL (ref 70–99)
Potassium: 4.2 mmol/L (ref 3.5–5.1)
Sodium: 141 mmol/L (ref 135–145)

## 2020-09-29 NOTE — Pre-Procedure Instructions (Addendum)
[id number] [address], [place] [address] Phone: [phone] Fax: [phone]     Your procedure is scheduled on Tuesday December 7th.  Report to [facility] Main Entrance "A" at 5:30 A.M., and check in at the Admitting office.  Call this number if you have problems the morning of surgery:  [phone]  Call [phone] if you have any questions prior to your surgery date Monday-Friday 8am-4pm    Remember:  Do not eat after midnight the night before your surgery  You may drink clear liquids until 4:30 the morning of your surgery.   Clear liquids allowed are: Water, Non-Citrus Juices (without pulp), Carbonated Beverages, Clear Tea, Black Coffee Only, and Gatorade   Patient Instructions  . The night before surgery:  o No food after midnight. ONLY clear liquids after midnight   . The day of surgery (if you do NOT have diabetes):  o Drink ONE (1) Pre-Surgery Clear Ensure as directed.   o This drink was given to you during your hospital  pre-op appointment visit. o The pre-op nurse will instruct you on the time to drink the  Pre-Surgery Ensure depending on your surgery time. o Finish the drink by 0430 the morning of your surgery o Nothing else to drink after completing the  Pre-Surgery Clear Ensure.            If you have questions, please contact your surgeon's office.   Take these medicines the morning of surgery with A SIP OF WATER  metoprolol tartrate (LOPRESSOR)  Follow your surgeon's instructions on when to stop apixaban Arne Cleveland)- last dose December 3rd.    As of today, STOP taking any Aspirin (unless otherwise instructed by your surgeon) Aleve, Naproxen, Ibuprofen, Motrin, Advil, Goody's, BC's, all herbal medications, fish oil, and all vitamins.                      Do not wear jewelry, make up, or nail polish            Do not wear lotions, powders, perfumes/colognes, or deodorant.            Do  not shave 48 hours prior to surgery.  Men may shave face and neck.            Do not bring valuables to the hospital.            [facility] is not responsible for any belongings or valuables.  Do NOT Smoke (Tobacco/Vaping) or drink Alcohol 24 hours prior to your procedure If you use a CPAP at night, you may bring all equipment for your overnight stay.   Contacts, glasses, dentures or bridgework may not be worn into surgery.      For patients admitted to the hospital, discharge time will be determined by your treatment team.   Patients discharged the day of surgery will not be allowed to drive home, and someone needs to stay with them for 24 hours.    Special instructions:   [place]- Preparing For Surgery  Before surgery, you can play an important role. Because skin is not sterile, your skin needs to be as free of germs as possible. You can reduce the number of germs on your skin by washing with CHG (chlorahexidine gluconate) Soap before surgery.  CHG is an antiseptic cleaner which kills germs and bonds with the skin to continue killing germs even after washing.    Oral Hygiene is  also important to reduce your risk of infection.  Remember - BRUSH YOUR TEETH THE MORNING OF SURGERY WITH YOUR REGULAR TOOTHPASTE  Please do not use if you have an allergy to CHG or antibacterial soaps. If your skin becomes reddened/irritated stop using the CHG.  Do not shave (including legs and underarms) for at least 48 hours prior to first CHG shower. It is OK to shave your face.  Please follow these instructions carefully.   1. Shower the NIGHT BEFORE SURGERY and the MORNING OF SURGERY with CHG Soap.   2. If you chose to wash your hair, wash your hair first as usual with your normal shampoo.  3. After you shampoo, rinse your hair and body thoroughly to remove the shampoo.  4. Use CHG as you would any other liquid soap. You can apply CHG directly to the skin and wash gently with a scrungie or a clean  washcloth.   5. Apply the CHG Soap to your body ONLY FROM THE NECK DOWN.  Do not use on open wounds or open sores. Avoid contact with your eyes, ears, mouth and genitals (private parts). Wash Face and genitals (private parts)  with your normal soap.   6. Wash thoroughly, paying special attention to the area where your surgery will be performed.  7. Thoroughly rinse your body with warm water from the neck down.  8. DO NOT shower/wash with your normal soap after using and rinsing off the CHG Soap.  9. Pat yourself dry with a CLEAN TOWEL.  10. Wear CLEAN PAJAMAS to bed the night before surgery  11. Place CLEAN SHEETS on your bed the night of your first shower and DO NOT SLEEP WITH PETS.   Day of Surgery: Wear Clean/Comfortable clothing the morning of surgery Do not apply any deodorants/lotions.   Remember to brush your teeth WITH YOUR REGULAR TOOTHPASTE.   Please read over the following fact sheets that you were given.

## 2020-09-29 NOTE — Progress Notes (Signed)
PCP - Dr. Ernie Hew Cardiologist - Dr. Sallyanne Kuster  PPM/ICD - Medtronic PPM Device Orders - orders requested Rep Notified - e-mail sent   EKG - 11/25/19 Stress Test - 11/25/09 ECHO - 01/05/10 Cardiac Cath -12/11/00   Sleep Study -  negative CPAP - does not use  Blood Thinner Instructions: pt to hold Eliquis 3 days prior to surgery. Last dose 10/02/20. Extensive education given on when to discontinue eliquis. Aspirin Instructions: Patient instructed to hold all Aspirin, NSAID's, herbal medications, fish oil and vitamins 7 days prior to surgery.   ERAS Protcol - clear liquids until 0430 DOS PRE-SURGERY Ensure or G2- Ensure- complete by 0430  COVID TEST- 10/02/20 at at [facility]. Pt instructed to remain in their car. Educated on Transport planner until [employer].    Anesthesia review: cardiac history  Patient denies shortness of breath, fever, cough and chest pain at PAT appointment   All instructions explained to the patient, with a verbal understanding of the material. Patient agrees to go over the instructions while at home for a better understanding. Patient also instructed to self quarantine after being tested for COVID-19. The opportunity to ask questions was provided.

## 2020-09-30 ENCOUNTER — Encounter: Payer: Self-pay | Admitting: Cardiovascular Disease

## 2020-09-30 NOTE — Progress Notes (Signed)
PERIOPERATIVE PRESCRIPTION FOR IMPLANTED CARDIAC DEVICE PROGRAMMING   Patient Information: Name: David Booth, David Booth   DOB: April 20, 1933  MRN: [id number]    Planned Procedure: Laparoscopic Right Inguinal Hernia  Surgeon: Dr. Rosendo Gros  Date of Procedure: 10/06/20  Cautery will be used.  Position during surgery: supine   Please send documentation back to:  [facility] (Fax # [phone])   Hope Budds, RN  09/28/2020 5:01 PM       Device Information:   Clinic EP Physician:   Sanda Klein Device Type:  Pacemaker Manufacturer and Phone #:  Medtronic: [phone] Pacemaker Dependent?:  No Date of Last Device Check:  07/01/20        Normal Device Function?:  Yes     Electrophysiologist's Recommendations:    Have magnet available.  Provide continuous ECG monitoring when magnet is used or reprogramming is to be performed.   Procedure should not interfere with device function.  No device programming or magnet placement needed.  Per Device Clinic Standing Orders, Drake Leach  09/30/2020 4:34 PM

## 2020-09-30 NOTE — Anesthesia Preprocedure Evaluation (Addendum)
Anesthesia Evaluation  Patient identified by MRN, date of birth, ID band Patient awake    Reviewed: Allergy & Precautions, H&P , NPO status , Patient's Chart, lab work & pertinent test results  Airway Mallampati: II   Neck ROM: full    Dental   Pulmonary former smoker,    breath sounds clear to auscultation       Cardiovascular hypertension, + dysrhythmias Atrial Fibrillation + pacemaker  Rhythm:regular Rate:Normal     Neuro/Psych  Neuromuscular disease    GI/Hepatic   Endo/Other    Renal/GU      Musculoskeletal  (+) Arthritis ,   Abdominal   Peds  Hematology   Anesthesia Other Findings   Reproductive/Obstetrics                            Anesthesia Physical Anesthesia Plan  ASA: III  Anesthesia Plan: General   Post-op Pain Management:    Induction: Intravenous  PONV Risk Score and Plan: 2 and Ondansetron, Dexamethasone and Treatment may vary due to age or medical condition  Airway Management Planned: Oral ETT  Additional Equipment:   Intra-op Plan:   Post-operative Plan: Extubation in OR  Informed Consent: I have reviewed the patients History and Physical, chart, labs and discussed the procedure including the risks, benefits and alternatives for the proposed anesthesia with the patient or authorized representative who has indicated his/her understanding and acceptance.       Plan Discussed with: CRNA, Anesthesiologist and Surgeon  Anesthesia Plan Comments: (PAT note written 09/30/2020 by Myra Gianotti, PA-C. )       Anesthesia Quick Evaluation

## 2020-09-30 NOTE — Progress Notes (Signed)
Anesthesia Follow-up:  Case: [id number] Date/Time: 10/06/20 0715   Procedure: LAPAROSCOPIC RIGHT INGUINAL HERNIA REPAIR WITH MESH (Right )   Anesthesia type: General   Pre-op diagnosis: RIGHT INGUINAL HERNIA   Location: [facility] ROOM 02 / [facility]   Surgeons: Ralene Ok, MD      DISCUSSION: See my previous note signed on 08/05/20.  Laparoscopic right inguinal hernia repair was initially scheduled for 08/13/2020, but for unclear reasons was rescheduled for 10/06/2020.    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 Sallyanne Kuster, "He does have an abandoned/5086 RV lead and therefore his pacemaker system is not MRI conditional.To reprogram the right ventricular output to chronic levels later this month."), PAF, neuropathy.  Preoperative cardiology input outlined by Coletta Memos, NP on 06/15/2020: "Given past medical history and time since last visit, based on ACC/AHA guidelines,David F Foxwould be at acceptable risk for the planned procedure without further cardiovascular testing.   His Eliquis may be held for 48 hours prior to his procedure. Please resume as soon as hemostasis is achieved."  Reported last Eliquis scheduled for 10/02/2020.  Device Information: Clinic EP Physician:Croitoru, Mihai Device Type:Pacemaker Manufacturer and Phone #:Medtronic: [phone] Pacemaker Dependent?:No Date of Last Device Check:9/1/21Normal Device Function?:Yes  Electrophysiologist's Recommendations:  Have magnet available.  Provide continuous ECG monitoring when magnet is used or reprogramming is to be performed.  Procedure should not interfere with device function.  No device programming or magnet placement needed.  Received second [place] COVID-19 vaccine on 01/02/2020.  Presurgical COVID-19 test is scheduled for  10/02/2020.  Anesthesia team to evaluate on the day of surgery.   VS: BP (!) 163/80   Pulse 70   Temp 36.6 C (Oral)   Resp 17   Ht 5\' 9"  (1.753 m)   Wt 73.8 kg   SpO2 100%   BMI 24.04 kg/m   PROVIDERS: [name], MD is PCP  - Alyson Locket,, MD is cardiologist.  Last office visit 12/30/2019.  Has Medtronic PPM.  Known trifascicular block (RBBB, LAFB, prolonged PR interval).  PPM function normal at that time (96% atrial pacing, 0.2% ventricular pacing) with burden of arrhythmia very low. CHA2DS2-VASc score at least 2.  He was tolerating anticoagulation.  His underlying rhythm is sinus bradycardia around 40 bpm.  Patient prefers a slower heart rate and felt better with a less aggressive rate response sensor.  23-month office follow-up planned.   LABS: Labs reviewed: Acceptable for surgery. (all labs ordered are listed, but only abnormal results are displayed)  Labs Reviewed  CBC - Abnormal; Notable for the following components:      Result Value   Platelets 125 (*)    All other components within normal limits  BASIC METABOLIC PANEL    IMAGES: CXR 10/22/19: FINDINGS: Cardiac pacer with lead tips over the right atrium right ventricle. Mild cardiomegaly. No pulmonary venous congestion. Low lung volumes. Small left pleural effusion cannot be excluded. No pneumothorax. IMPRESSION: 1. Cardiac pacer with lead tips over the right atrium and right ventricle. No pneumothorax. Mild cardiomegaly. No pulmonary venous congestion. 2. Low lung volumes. Small left pleural effusion cannot be excluded.   EKG: 11/28/19 ([facility]): Atrial paced rhythm with prolonged AV conduction Left axis deviation Right bundle branch block and left anterior fascicular block Inferior infarct, age undetermined   CV: Nuclear stress test 11/25/09: Impression: Normal myocardial perfusion scan  demonstrating an attenuation artifact in the inferior region of the myocardium.  No ischemia or  infarct/scan is seen in the remaining myocardium.  No prior study available for comparison.  No significant ischemia demonstrated.  Post-rest EF 59%.  Global LV systolic function is normal.  This is a low risk scan.  Echo 01/05/10: Summary: There is mild asymmetric left ventricular hypertrophy.  Left ventricular systolic function is normal.  EF > 55%.  The right ventricle is mildly dilated.  The right ventricular systolic function is borderline reduced.  The LA is mildly dilated.  The right atrium is mild to moderately dilated.  There is moderate tricuspid regurgitation.  RVSP is elevated at 41 mmHg.  There is mild to moderate pulmonary hypertension.  No aortic stenosis.  Trace aortic regurgitation.  Cardiac cath 12/11/00: FINAL DIAGNOSES: 1. Angiographically patent coronary arteries. 2. False positive EKG test for ischemia (clinical diagnosis). 3. Normal left ventricular systolic function, 42%. 4. Normal renal arteries and abdominal aorta.   Past Medical History:  Diagnosis Date  . Arthritis   . Coronary artery disease    patent coronary arteries 12/11/00 LHC  . Hypertension   . Neuropathy   . Numbness in feet   . Pacemaker    Medtronic  . PAF (paroxysmal atrial fibrillation) ([place])     Past Surgical History:  Procedure Laterality Date  . CARDIAC CATHETERIZATION     10 + years  . LEAD REVISION/REPAIR N/A 10/21/2019   Procedure: LEAD REVISION/REPAIR;  Surgeon: Sanda Klein, MD;  Location: [facility];  Service: Cardiovascular;  Laterality: N/A;  . pace maker    . PPM GENERATOR CHANGEOUT N/A 10/21/2019   Procedure: PPM GENERATOR CHANGEOUT;  Surgeon: Sanda Klein, MD;  Location: [facility];  Service: Cardiovascular;  Laterality: N/A;    MEDICATIONS: . Alpha-Lipoic Acid 100 MG TABS  . apixaban (ELIQUIS) 5 MG TABS tablet  . b complex vitamins tablet  . Cholecalciferol (VITAMIN D3) 2000 UNITS TABS  . Coenzyme Q10 (CO Q 10) 100 MG CAPS  . EPINEPHrine (EPI-PEN)  0.3 mg/0.3 mL DEVI  . Garlic 6834 MG CAPS  . Glucosamine-Chondroit-Vit C-Mn (GLUCOSAMINE CHONDR 1500 COMPLX) CAPS  . loratadine (CLARITIN) 10 MG tablet  . Lycopene 10 MG CAPS  . MAGNESIUM PO  . Melatonin 10 MG CAPS  . metoprolol tartrate (LOPRESSOR) 25 MG tablet  . Misc Natural Products (PROSTATE HEALTH) CAPS  . Multiple Vitamin (MULTIVITAMIN WITH MINERALS) TABS tablet  . Omega-3 Fatty Acids (FISH OIL) 1200 MG CAPS  . sildenafil (VIAGRA) 50 MG tablet  . vitamin C (ASCORBIC ACID) 250 MG tablet  . vitamin E (VITAMIN E) 400 UNIT capsule   No current facility-administered medications for this encounter.    Myra Gianotti, PA-C Surgical Short Stay/Anesthesiology [facility] Phone [phone] [facility] Phone [phone] 09/30/2020 5:05 PM

## 2020-10-01 DIAGNOSIS — E782 Mixed hyperlipidemia: Secondary | ICD-10-CM | POA: Diagnosis not present

## 2020-10-02 ENCOUNTER — Other Ambulatory Visit (HOSPITAL_COMMUNITY)
Admit: 2020-10-02 | Discharge: 2020-10-02 | Disposition: A | Discharge status: Home / Self Care | Payer: Medicare Other | Attending: General Surgery | Admitting: General Surgery

## 2020-10-02 DIAGNOSIS — Z01812 Encounter for preprocedural laboratory examination: Secondary | ICD-10-CM | POA: Diagnosis not present

## 2020-10-02 DIAGNOSIS — Z20822 Contact with and (suspected) exposure to covid-19: Secondary | ICD-10-CM | POA: Diagnosis not present

## 2020-10-02 LAB — SARS CORONAVIRUS 2 (TAT 6-24 HRS): SARS Coronavirus 2: NEGATIVE

## 2020-10-05 DIAGNOSIS — I10 Essential (primary) hypertension: Secondary | ICD-10-CM | POA: Diagnosis not present

## 2020-10-05 DIAGNOSIS — E782 Mixed hyperlipidemia: Secondary | ICD-10-CM | POA: Diagnosis not present

## 2020-10-05 DIAGNOSIS — R001 Bradycardia, unspecified: Secondary | ICD-10-CM | POA: Diagnosis not present

## 2020-10-05 DIAGNOSIS — K4090 Unilateral inguinal hernia, without obstruction or gangrene, not specified as recurrent: Secondary | ICD-10-CM | POA: Diagnosis not present

## 2020-10-05 DIAGNOSIS — R000 Tachycardia, unspecified: Secondary | ICD-10-CM | POA: Diagnosis not present

## 2020-10-06 ENCOUNTER — Ambulatory Visit (HOSPITAL_COMMUNITY)
Admission: RE | Admit: 2020-10-06 | Discharge: 2020-10-06 | Disposition: A | Discharge status: Home / Self Care | Payer: Medicare Other | Attending: General Surgery | Admitting: General Surgery

## 2020-10-06 ENCOUNTER — Other Ambulatory Visit: Payer: Self-pay

## 2020-10-06 ENCOUNTER — Encounter (HOSPITAL_COMMUNITY)
Admission: RE | Disposition: A | Discharge status: Home / Self Care | Payer: Self-pay | Source: Home / Self Care | Attending: General Surgery

## 2020-10-06 ENCOUNTER — Encounter (HOSPITAL_COMMUNITY): Payer: Self-pay | Admitting: General Surgery

## 2020-10-06 ENCOUNTER — Ambulatory Visit (HOSPITAL_COMMUNITY): Payer: Medicare Other | Admitting: Certified Registered"

## 2020-10-06 ENCOUNTER — Ambulatory Visit (HOSPITAL_COMMUNITY): Payer: Medicare Other | Admitting: Vascular Surgery

## 2020-10-06 DIAGNOSIS — Z79899 Other long term (current) drug therapy: Secondary | ICD-10-CM | POA: Diagnosis not present

## 2020-10-06 DIAGNOSIS — K4090 Unilateral inguinal hernia, without obstruction or gangrene, not specified as recurrent: Secondary | ICD-10-CM | POA: Diagnosis not present

## 2020-10-06 DIAGNOSIS — I4510 Unspecified right bundle-branch block: Secondary | ICD-10-CM | POA: Diagnosis not present

## 2020-10-06 DIAGNOSIS — Z87891 Personal history of nicotine dependence: Secondary | ICD-10-CM | POA: Diagnosis not present

## 2020-10-06 DIAGNOSIS — Z7901 Long term (current) use of anticoagulants: Secondary | ICD-10-CM | POA: Insufficient documentation

## 2020-10-06 DIAGNOSIS — Z9889 Other specified postprocedural states: Secondary | ICD-10-CM

## 2020-10-06 DIAGNOSIS — I480 Paroxysmal atrial fibrillation: Secondary | ICD-10-CM | POA: Diagnosis not present

## 2020-10-06 DIAGNOSIS — Z8719 Personal history of other diseases of the digestive system: Secondary | ICD-10-CM

## 2020-10-06 DIAGNOSIS — I10 Essential (primary) hypertension: Secondary | ICD-10-CM | POA: Diagnosis not present

## 2020-10-06 HISTORY — PX: INGUINAL HERNIA REPAIR: SHX194

## 2020-10-06 HISTORY — PX: INSERTION OF MESH: SHX5868

## 2020-10-06 SURGERY — REPAIR, HERNIA, INGUINAL, LAPAROSCOPIC
Anesthesia: General | Site: Inguinal | Laterality: Right

## 2020-10-06 MED ORDER — CEFAZOLIN SODIUM-DEXTROSE 2-4 GM/100ML-% IV SOLN
2.0000 g | INTRAVENOUS | Status: AC
  Administered 2020-10-06: 2 g via INTRAVENOUS

## 2020-10-06 MED ORDER — ENSURE PRE-SURGERY PO LIQD: 296.0000 mL | Freq: Once | ORAL | Status: DC

## 2020-10-06 MED ORDER — FENTANYL CITRATE (PF) 250 MCG/5ML IJ SOLN
INTRAMUSCULAR | Status: AC
  Filled 2020-10-06: qty 5

## 2020-10-06 MED ORDER — OXYCODONE HCL 5 MG/5ML PO SOLN: 5.0000 mg | Freq: Once | ORAL | Status: DC | PRN

## 2020-10-06 MED ORDER — ACETAMINOPHEN 500 MG PO TABS
ORAL_TABLET | ORAL | Status: AC
  Filled 2020-10-06: qty 2

## 2020-10-06 MED ORDER — ONDANSETRON HCL 4 MG/2ML IJ SOLN
INTRAMUSCULAR | Status: DC | PRN
  Administered 2020-10-06: 4 mg via INTRAVENOUS

## 2020-10-06 MED ORDER — STERILE WATER FOR INJECTION IJ SOLN
INTRAMUSCULAR | Status: DC | PRN
  Administered 2020-10-06: 100 mL via INTRAVESICAL

## 2020-10-06 MED ORDER — TRAMADOL HCL 50 MG PO TABS
ORAL_TABLET | ORAL | Status: AC
  Filled 2020-10-06: qty 1

## 2020-10-06 MED ORDER — FENTANYL CITRATE (PF) 100 MCG/2ML IJ SOLN
25.0000 ug | INTRAMUSCULAR | Status: DC | PRN
  Administered 2020-10-06: 25 ug via INTRAVENOUS
  Administered 2020-10-06: 50 ug via INTRAVENOUS

## 2020-10-06 MED ORDER — LIDOCAINE 2% (20 MG/ML) 5 ML SYRINGE
INTRAMUSCULAR | Status: DC | PRN
  Administered 2020-10-06: 40 mg via INTRAVENOUS

## 2020-10-06 MED ORDER — BUPIVACAINE HCL 0.25 % IJ SOLN
INTRAMUSCULAR | Status: DC | PRN
  Administered 2020-10-06: 7 mL

## 2020-10-06 MED ORDER — 0.9 % SODIUM CHLORIDE (POUR BTL) OPTIME
TOPICAL | Status: DC | PRN
  Administered 2020-10-06: 1000 mL

## 2020-10-06 MED ORDER — ROCURONIUM BROMIDE 10 MG/ML (PF) SYRINGE
PREFILLED_SYRINGE | INTRAVENOUS | Status: DC | PRN
  Administered 2020-10-06: 50 mg via INTRAVENOUS

## 2020-10-06 MED ORDER — FENTANYL CITRATE (PF) 100 MCG/2ML IJ SOLN
INTRAMUSCULAR | Status: AC
  Filled 2020-10-06: qty 2

## 2020-10-06 MED ORDER — CHLORHEXIDINE GLUCONATE CLOTH 2 % EX PADS: 6.0000 | MEDICATED_PAD | Freq: Once | CUTANEOUS | Status: DC

## 2020-10-06 MED ORDER — DEXAMETHASONE SODIUM PHOSPHATE 10 MG/ML IJ SOLN
INTRAMUSCULAR | Status: DC | PRN
  Administered 2020-10-06: 5 mg via INTRAVENOUS

## 2020-10-06 MED ORDER — ORAL CARE MOUTH RINSE: 15.0000 mL | Freq: Once | OROMUCOSAL | Status: AC

## 2020-10-06 MED ORDER — CEFAZOLIN SODIUM-DEXTROSE 2-4 GM/100ML-% IV SOLN
INTRAVENOUS | Status: AC
  Filled 2020-10-06: qty 100

## 2020-10-06 MED ORDER — PROPOFOL 10 MG/ML IV BOLUS
INTRAVENOUS | Status: DC | PRN
  Administered 2020-10-06: 90 mg via INTRAVENOUS

## 2020-10-06 MED ORDER — FENTANYL CITRATE (PF) 250 MCG/5ML IJ SOLN
INTRAMUSCULAR | Status: DC | PRN
  Administered 2020-10-06: 50 ug via INTRAVENOUS

## 2020-10-06 MED ORDER — LACTATED RINGERS IV SOLN: INTRAVENOUS | Status: DC

## 2020-10-06 MED ORDER — CHLORHEXIDINE GLUCONATE 0.12 % MT SOLN
15.0000 mL | Freq: Once | OROMUCOSAL | Status: AC
  Administered 2020-10-06: 15 mL via OROMUCOSAL

## 2020-10-06 MED ORDER — BUPIVACAINE HCL (PF) 0.25 % IJ SOLN
INTRAMUSCULAR | Status: AC
  Filled 2020-10-06: qty 60

## 2020-10-06 MED ORDER — CHLORHEXIDINE GLUCONATE 0.12 % MT SOLN
OROMUCOSAL | Status: AC
  Filled 2020-10-06: qty 15

## 2020-10-06 MED ORDER — OXYCODONE HCL 5 MG PO TABS: 5.0000 mg | ORAL_TABLET | Freq: Once | ORAL | Status: DC | PRN

## 2020-10-06 MED ORDER — SUGAMMADEX SODIUM 200 MG/2ML IV SOLN
INTRAVENOUS | Status: DC | PRN
  Administered 2020-10-06: 200 mg via INTRAVENOUS

## 2020-10-06 MED ORDER — PHENYLEPHRINE 40 MCG/ML (10ML) SYRINGE FOR IV PUSH (FOR BLOOD PRESSURE SUPPORT)
PREFILLED_SYRINGE | INTRAVENOUS | Status: DC | PRN
  Administered 2020-10-06: 80 ug via INTRAVENOUS

## 2020-10-06 MED ORDER — ROCURONIUM BROMIDE 10 MG/ML (PF) SYRINGE
PREFILLED_SYRINGE | INTRAVENOUS | Status: AC
  Filled 2020-10-06: qty 10

## 2020-10-06 MED ORDER — LIDOCAINE HCL (PF) 2 % IJ SOLN
INTRAMUSCULAR | Status: AC
  Filled 2020-10-06: qty 5

## 2020-10-06 MED ORDER — TRAMADOL HCL 50 MG PO TABS
50.0000 mg | ORAL_TABLET | Freq: Four times a day (QID) | ORAL | 0 refills | Status: DC | PRN
Start: 2020-10-06 — End: 2021-10-06

## 2020-10-06 MED ORDER — PROPOFOL 10 MG/ML IV BOLUS
INTRAVENOUS | Status: AC
  Filled 2020-10-06: qty 20

## 2020-10-06 MED ORDER — ONDANSETRON HCL 4 MG/2ML IJ SOLN: 4.0000 mg | Freq: Four times a day (QID) | INTRAMUSCULAR | Status: DC | PRN

## 2020-10-06 MED ORDER — TRAMADOL HCL 50 MG PO TABS
50.0000 mg | ORAL_TABLET | Freq: Four times a day (QID) | ORAL | Status: DC | PRN
  Administered 2020-10-06: 50 mg via ORAL

## 2020-10-06 MED ORDER — ACETAMINOPHEN 500 MG PO TABS
1000.0000 mg | ORAL_TABLET | ORAL | Status: AC
  Administered 2020-10-06: 1000 mg via ORAL

## 2020-10-06 SURGICAL SUPPLY — 38 items
CANISTER SUCT 3000ML PPV (MISCELLANEOUS) IMPLANT
COVER SURGICAL LIGHT HANDLE (MISCELLANEOUS) ×3 IMPLANT
COVER WAND RF STERILE (DRAPES) ×3 IMPLANT
DERMABOND ADVANCED (GAUZE/BANDAGES/DRESSINGS) ×2
DERMABOND ADVANCED .7 DNX12 (GAUZE/BANDAGES/DRESSINGS) ×1 IMPLANT
DISSECTOR BLUNT TIP ENDO 5MM (MISCELLANEOUS) IMPLANT
ELECT REM PT RETURN 9FT ADLT (ELECTROSURGICAL) ×3
ELECTRODE REM PT RTRN 9FT ADLT (ELECTROSURGICAL) ×1 IMPLANT
GLOVE BIO SURGEON STRL SZ7.5 (GLOVE) ×3 IMPLANT
GOWN STRL REUS W/ TWL LRG LVL3 (GOWN DISPOSABLE) ×2 IMPLANT
GOWN STRL REUS W/ TWL XL LVL3 (GOWN DISPOSABLE) ×1 IMPLANT
GOWN STRL REUS W/TWL LRG LVL3 (GOWN DISPOSABLE) ×6
GOWN STRL REUS W/TWL XL LVL3 (GOWN DISPOSABLE) ×3
KIT BASIN OR (CUSTOM PROCEDURE TRAY) ×3 IMPLANT
KIT TURNOVER KIT B (KITS) ×3 IMPLANT
MESH 3DMAX 5X7 RT XLRG (Mesh General) ×3 IMPLANT
NEEDLE INSUFFLATION 14GA 120MM (NEEDLE) ×3 IMPLANT
NS IRRIG 1000ML POUR BTL (IV SOLUTION) ×3 IMPLANT
PAD ARMBOARD 7.5X6 YLW CONV (MISCELLANEOUS) ×6 IMPLANT
RELOAD STAPLE HERNIA 4.0 BLUE (INSTRUMENTS) ×3 IMPLANT
RELOAD STAPLE HERNIA 4.8 BLK (STAPLE) IMPLANT
SCISSORS LAP 5X35 DISP (ENDOMECHANICALS) ×3 IMPLANT
SET IRRIG TUBING LAPAROSCOPIC (IRRIGATION / IRRIGATOR) IMPLANT
SET TUBE SMOKE EVAC HIGH FLOW (TUBING) ×3 IMPLANT
STAPLER HERNIA 12 8.5 360D (INSTRUMENTS) ×3 IMPLANT
SUT MNCRL AB 4-0 PS2 18 (SUTURE) ×3 IMPLANT
SUT VIC AB 1 CT1 27 (SUTURE)
SUT VIC AB 1 CT1 27XBRD ANBCTR (SUTURE) IMPLANT
SYRINGE TOOMEY DISP (SYRINGE) ×3 IMPLANT
TOWEL GREEN STERILE (TOWEL DISPOSABLE) ×3 IMPLANT
TOWEL GREEN STERILE FF (TOWEL DISPOSABLE) ×3 IMPLANT
TRAY FOLEY W/BAG SLVR 14FR (SET/KITS/TRAYS/PACK) ×3 IMPLANT
TRAY LAPAROSCOPIC MC (CUSTOM PROCEDURE TRAY) ×3 IMPLANT
TROCAR OPTICAL SHORT 5MM (TROCAR) ×3 IMPLANT
TROCAR OPTICAL SLV SHORT 5MM (TROCAR) ×3 IMPLANT
TROCAR XCEL 12X100 BLDLESS (ENDOMECHANICALS) ×3 IMPLANT
WARMER LAPAROSCOPE (MISCELLANEOUS) ×3 IMPLANT
WATER STERILE IRR 1000ML POUR (IV SOLUTION) ×3 IMPLANT

## 2020-10-06 NOTE — Anesthesia Postprocedure Evaluation (Signed)
Anesthesia Post Note  Patient: David Booth  Procedure(s) Performed: LAPAROSCOPIC RIGHT INGUINAL HERNIA REPAIR WITH MESH (Right Inguinal) INSERTION OF MESH (Right Inguinal)     Patient location during evaluation: PACU Anesthesia Type: General Level of consciousness: awake and alert Pain management: pain level controlled Vital Signs Assessment: post-procedure vital signs reviewed and stable Respiratory status: spontaneous breathing, nonlabored ventilation, respiratory function stable and patient connected to nasal cannula oxygen Cardiovascular status: blood pressure returned to baseline and stable Postop Assessment: no apparent nausea or vomiting Anesthetic complications: no   No complications documented.  Last Vitals:  Vitals:   10/06/20 0845 10/06/20 0900  BP: (!) 167/82 (!) 147/82  Pulse: (!) 59 60  Resp: 20 (!) 21  Temp:  36.6 C  SpO2: 99% 97%    Last Pain:  Vitals:   10/06/20 0900  PainSc: 0-No pain                 Sharni Negron S

## 2020-10-06 NOTE — H&P (Signed)
History of Present Illness  The patient is a 86 year old male who presents with an inguinal hernia. Referred by: Dr. Ernie Hew Chief Complaint: Right inguinal hernia  Patient is an 86 year old male with a history of a pacemake and A. fib, SVT, sick sinus syndrome r, on eliquis, who comes in with a right inguinal hernia. He states he's had these are for several years. He states it is not currently bothering him. He states that he does do some woodworking. He tries to stay away from any heavy lifting. Patient's he does state that the hernia reduces when he lays down.  He's had no previous abdominal surgery   Dr. Sallyanne Kuster is his cardiologist    Past Surgical History No pertinent past surgical history   Diagnostic Studies History  Colonoscopy  >10 years ago  Allergies No Known Drug Allergies  [06/11/2020]: Allergies Reconciled   Medication History  Eliquis (5MG  Tablet, Oral) Active. Metoprolol Tartrate (25MG  Tablet, Oral) Active. Medications Reconciled  Social History  Caffeine use  Coffee, Tea. No alcohol use  No drug use  Tobacco use  Former smoker.  Family History Depression  Sister. Diabetes Mellitus  Brother. Heart Disease  Brother. Melanoma  Daughter. Migraine Headache  Daughter.  Other Problems Arthritis  Enlarged Prostate  Gastric Ulcer  Hypercholesterolemia     Review of Systems  General Not Present- Appetite Loss, Chills, Fatigue, Fever, Night Sweats, Weight Gain and Weight Loss. Skin Not Present- Change in Wart/Mole, Dryness, Hives, Jaundice, New Lesions, Non-Healing Wounds, Rash and Ulcer. HEENT Present- Wears glasses/contact lenses. Not Present- Earache, Hearing Loss, Hoarseness, Nose Bleed, Oral Ulcers, Ringing in the Ears, Seasonal Allergies, Sinus Pain, Sore Throat, Visual Disturbances and Yellow Eyes. Respiratory Not Present- Bloody sputum, Chronic Cough, Difficulty Breathing, Snoring and Wheezing. Breast Not  Present- Breast Mass, Breast Pain, Nipple Discharge and Skin Changes. Gastrointestinal Not Present- Abdominal Pain, Bloating, Bloody Stool, Change in Bowel Habits, Chronic diarrhea, Constipation, Difficulty Swallowing, Excessive gas, Gets full quickly at meals, Hemorrhoids, Indigestion, Nausea, Rectal Pain and Vomiting. Male Genitourinary Present- Frequency and Impotence. Not Present- Blood in Urine, Change in Urinary Stream, Nocturia, Painful Urination, Urgency and Urine Leakage. Musculoskeletal Not Present- Back Pain, Joint Pain, Joint Stiffness, Muscle Pain, Muscle Weakness and Swelling of Extremities. Neurological Not Present- Decreased Memory, Fainting, Headaches, Numbness, Seizures, Tingling, Tremor, Trouble walking and Weakness. Psychiatric Not Present- Anxiety, Bipolar, Change in Sleep Pattern, Depression, Fearful and Frequent crying. Endocrine Not Present- Cold Intolerance, Excessive Hunger, Hair Changes, Heat Intolerance, Hot flashes and New Diabetes. Hematology Present- Blood Thinners. Not Present- Easy Bruising, Excessive bleeding, Gland problems, HIV and Persistent Infections. All other systems negative  BP (!) 168/71   Pulse 60   Temp 97.8 F (36.6 C)   Resp 18   Ht 5\' 9"  (1.753 m)   Wt 74.4 kg   SpO2 99%   BMI 24.22 kg/m     Physical Exam  The physical exam findings are as follows: Note: Constitutional: No acute distress, conversant, appears stated age  Eyes: Anicteric sclerae, moist conjunctiva, no lid lag  Neck: No thyromegaly, trachea midline, no cervical lymphadenopathy  Lungs: Clear to auscultation biilaterally, normal respiratory effot  Cardiovascular: regular rate & rhythm, no murmurs, no peripheal edema, pedal pulses 2+  GI: Soft, no masses or hepatosplenomegaly, non-tender to palpation  MSK: Normal gait, no clubbing cyanosis, edema  Skin: No rashes, palpation reveals normal skin turgor  Psychiatric: Appropriate judgment and insight,  oriented to person, place, and time  Abdomen Inspection Hernias -  Right - Inguinal hernia - Reducible - Right.    Assessment & Plan  RIGHT INGUINAL HERNIA (K40.90) Impression: Patient is an 86 year old male, with a history of sick sinus syndrome, SVT, A. fib on Eliquis, who comes in with a right inguinal hernia. Patient states he is currently asymptomatic.   We will proceed to the OR for a lap right inguinal hernia repair with mesh at the patient has obtained cardiac clearance. I did discuss with him the risks and benefits of the procedure to include but not limited to: Infection, bleeding, damage to structures, possible need further surgery.  Patient was understanding and wished to proceed.

## 2020-10-06 NOTE — Transfer of Care (Signed)
Immediate Anesthesia Transfer of Care Note  Patient: David Booth  Procedure(s) Performed: LAPAROSCOPIC RIGHT INGUINAL HERNIA REPAIR WITH MESH (Right Inguinal)  Patient Location: PACU  Anesthesia Type:General  Level of Consciousness: awake, alert  and oriented  Airway & Oxygen Therapy: Patient Spontanous Breathing  Post-op Assessment: Report given to RN, Post -op Vital signs reviewed and stable and Patient moving all extremities  Post vital signs: Reviewed and stable  Last Vitals:  Vitals Value Taken Time  BP 143/74 10/06/20 0827  Temp    Pulse 59 10/06/20 0831  Resp 19 10/06/20 0831  SpO2 100 % 10/06/20 0831  Vitals shown include unvalidated device data.  Last Pain:  Vitals:   10/06/20 0602  PainSc: 0-No pain      Patients Stated Pain Goal: 3 (18/55/01 5868)  Complications: No complications documented.

## 2020-10-06 NOTE — Discharge Instructions (Signed)
[facility] _______[facility] °INGUINAL HERNIA REPAIR: POST OP INSTRUCTIONS ° °Always review your discharge instruction sheet given to you by the facility where your surgery was performed. °IF YOU HAVE DISABILITY OR FAMILY LEAVE FORMS, YOU MUST BRING THEM TO THE OFFICE FOR PROCESSING.   °DO NOT GIVE THEM TO YOUR DOCTOR. ° °1. A  prescription for pain medication may be given to you upon discharge.  Take your pain medication as prescribed, if needed.  If narcotic pain medicine is not needed, then you may take acetaminophen (Tylenol) or ibuprofen (Advil) as needed. °2. Take your usually prescribed medications unless otherwise directed. °If you need a refill on your pain medication, please contact your pharmacy.  They will contact our office to request authorization. Prescriptions will not be filled after 5 pm or on week-ends. °3. You should follow a light diet the first 24 hours after arrival home, such as soup and crackers, etc.  Be sure to include lots of fluids daily.  Resume your normal diet the day after surgery. °4.Most patients will experience some swelling and bruising around the umbilicus or in the groin and scrotum.  Ice packs and reclining will help.  Swelling and bruising can take several days to resolve.  °6. It is common to experience some constipation if taking pain medication after surgery.  Increasing fluid intake and taking a stool softener (such as Colace) will usually help or prevent this problem from occurring.  A mild laxative (Milk of Magnesia or Miralax) should be taken according to package directions if there are no bowel movements after 48 hours. °7. Unless discharge instructions indicate otherwise, you may remove your bandages 24-48 hours after surgery, and you may shower at that time.  You may have steri-strips (small skin tapes) in place directly over the incision.  These strips should be left on the skin for 7-10 days.  If your surgeon used skin glue on the incision, you may  shower in 24 hours.  The glue will flake off over the next 2-3 weeks.  Any sutures or staples will be removed at the office during your follow-up visit. °8. ACTIVITIES:  You may resume regular (light) daily activities beginning the next day--such as daily self-care, walking, climbing stairs--gradually increasing activities as tolerated.  You may have sexual intercourse when it is comfortable.  Refrain from any heavy lifting or straining until approved by your doctor. ° °a.You may drive when you are no longer taking prescription pain medication, you can comfortably wear a seatbelt, and you can safely maneuver your car and apply brakes. °b.RETURN TO WORK:   °_____________________________________________ ° °9.You should see your doctor in the office for a follow-up appointment approximately 2-3 weeks after your surgery.  Make sure that you call for this appointment within a day or two after you arrive home to insure a convenient appointment time. °10.OTHER INSTRUCTIONS: _________________________ °   _____________________________________ ° °WHEN TO CALL YOUR DOCTOR: °1. Fever over 101.0 °2. Inability to urinate °3. Nausea and/or vomiting °4. Extreme swelling or bruising °5. Continued bleeding from incision. °6. Increased pain, redness, or drainage from the incision ° °The clinic staff is available to answer your questions during regular business hours.  Please don’t hesitate to call and ask to speak to one of the nurses for clinical concerns.  If you have a medical emergency, go to the nearest emergency room or call 911.  A surgeon from [facility] is always on call at the hospital ° ° °[address], [address], [place], [place]  [address] ? ° [address] [address], [place], [place]   [address] °[phone] ? [phone] ? FAX [phone] °Web site: [email or web address] ° °

## 2020-10-06 NOTE — Anesthesia Procedure Notes (Signed)
Procedure Name: Intubation Date/Time: 10/06/2020 7:31 AM Performed by: Amadeo Garnet, CRNA Pre-anesthesia Checklist: Patient identified, Emergency Drugs available, Suction available and Patient being monitored Patient Re-evaluated:Patient Re-evaluated prior to induction Oxygen Delivery Method: Circle system utilized Preoxygenation: Pre-oxygenation with 100% oxygen Induction Type: IV induction Ventilation: Mask ventilation without difficulty Laryngoscope Size: Mac and 4 Grade View: Grade I Tube type: Oral Tube size: 7.0 mm Number of attempts: 1 Airway Equipment and Method: Stylet and Oral airway Placement Confirmation: ETT inserted through vocal cords under direct vision,  positive ETCO2 and breath sounds checked- equal and bilateral Secured at: 22 cm Tube secured with: Tape Dental Injury: Teeth and Oropharynx as per pre-operative assessment

## 2020-10-06 NOTE — Op Note (Signed)
10/06/2020  8:11 AM  PATIENT:  David Booth  86 y.o. male  PRE-OPERATIVE DIAGNOSIS:  RIGHT INGUINAL HERNIA  POST-OPERATIVE DIAGNOSIS:  RIGHT INDIRECT INGUINAL HERNIA  PROCEDURE:  Procedure(s): LAPAROSCOPIC RIGHT INGUINAL HERNIA REPAIR WITH MESH (Right)  SURGEON:  Surgeon(s) and Role:    * Ralene Ok, MD - Primary  ASSISTANTS: Sheria Lang, MD, PGY-7 who was integral in assisting with camera driving  ANESTHESIA:   local and general  EBL:  minimal   BLOOD ADMINISTERED:none  DRAINS: none   LOCAL MEDICATIONS USED:  BUPIVICAINE   SPECIMEN:  No Specimen  DISPOSITION OF SPECIMEN:  N/A  COUNTS:  YES  TOURNIQUET:  * No tourniquets in log *  DICTATION: .Dragon Dictation Counts: reported as correct x 2  Findings:  The patient had a large right indirect hernia   Indications for procedure:  The patient is a 86 year old male with a right inguinal hernia for several months. Patient complained of symptomatology to his right inguinal area. The patient was taken back for elective inguinal hernia repair.  Details of the procedure: The patient was taken back to the operating room. The patient was placed in supine position with bilateral SCDs in place.  The patient was prepped and draped in the usual sterile fashion.  After appropriate anitbiotics were confirmed, a time-out was confirmed and all facts were verified.  0.25% Marcaine was used to infiltrate the umbilical area. A 11-blade was used to cut down the skin and blunt dissection was used to get the anterior fashion.  The anterior fascia was incised approximately 1 cm and the muscles were retracted laterally. Blunt dissection was then used to create a space in the preperitoneal area. At this time a 10 mm camera was then introduced into the space and advanced the pubic tubercle and a 12 mm trocar was placed over this and insufflation was started.  At this time and space was created from medial to laterally the preperitoneal space.   Cooper's ligament was initially cleaned off.  The hernia sac was identified in the indirect space. Dissection of the hernia sac and cord structures was undertaken the vas deferens was identified and protected in all parts of the case.    Once the hernia sac was taken down to approximately the umbilicus a Bard 3D Max mesh, size: [name], was  introduced into the preperitoneal space.  The mesh was brought over to cover the direct and indirect hernia spaces.  This was anchored into place and secured to Cooper's ligament with 4.20mm staples from a Coviden hernia stapler. It was anchored to the anterior abdominal wall with 4.8 mm staples. The hernia sac was seen lying posterior to the mesh. There was no staples placed laterally. The insufflation was evacuated and the peritoneum was seen posterior to the mesh. The trochars were removed. The anterior fascia was reapproximated using #1 Vicryl on a UR- 6.  Intra-abdominal air was evacuated and the Veress needle removed. The skin was reapproximated using 4-0 Monocryl subcuticular fashion and Dermabond. The patient was awakened from general anesthesia and taken to recovery in stable condition.   PLAN OF CARE: Discharge to home after PACU  PATIENT DISPOSITION:  PACU - hemodynamically stable.   Delay start of Pharmacological VTE agent (>24hrs) due to surgical blood loss or risk of bleeding: not applicable

## 2020-10-07 ENCOUNTER — Encounter (HOSPITAL_COMMUNITY): Payer: Self-pay | Admitting: General Surgery

## 2020-10-20 ENCOUNTER — Ambulatory Visit (INDEPENDENT_AMBULATORY_CARE_PROVIDER_SITE_OTHER): Payer: Medicare Other

## 2020-10-20 DIAGNOSIS — I495 Sick sinus syndrome: Secondary | ICD-10-CM

## 2020-10-20 LAB — CUP PACEART REMOTE DEVICE CHECK
Battery Remaining Longevity: 143 mo
Battery Voltage: 3.03 V
Brady Statistic AP VP Percent: 0.8 %
Brady Statistic AP VS Percent: 96.37 %
Brady Statistic AS VP Percent: 0 %
Brady Statistic AS VS Percent: 2.83 %
Brady Statistic RA Percent Paced: 97.28 %
Brady Statistic RV Percent Paced: 0.8 %
Date Time Interrogation Session: 20211220193007
Implantable Lead Implant Date: 20110429
Implantable Lead Implant Date: 20201221
Implantable Lead Location: 753859
Implantable Lead Location: 753860
Implantable Lead Model: 5076
Implantable Pulse Generator Implant Date: 20201221
Lead Channel Impedance Value: 304 Ohm
Lead Channel Impedance Value: 399 Ohm
Lead Channel Impedance Value: 418 Ohm
Lead Channel Impedance Value: 684 Ohm
Lead Channel Pacing Threshold Amplitude: 0.625 V
Lead Channel Pacing Threshold Amplitude: 0.75 V
Lead Channel Pacing Threshold Pulse Width: 0.4 ms
Lead Channel Pacing Threshold Pulse Width: 0.4 ms
Lead Channel Sensing Intrinsic Amplitude: 2.25 mV
Lead Channel Sensing Intrinsic Amplitude: 2.25 mV
Lead Channel Sensing Intrinsic Amplitude: 7.25 mV
Lead Channel Sensing Intrinsic Amplitude: 7.25 mV
Lead Channel Setting Pacing Amplitude: 1.5 V
Lead Channel Setting Pacing Amplitude: 2.5 V
Lead Channel Setting Pacing Pulse Width: 0.4 ms
Lead Channel Setting Sensing Sensitivity: 1.2 mV

## 2020-11-03 NOTE — Progress Notes (Signed)
Remote pacemaker transmission.   

## 2020-11-04 DIAGNOSIS — I10 Essential (primary) hypertension: Secondary | ICD-10-CM | POA: Diagnosis not present

## 2020-11-04 DIAGNOSIS — K5900 Constipation, unspecified: Secondary | ICD-10-CM | POA: Diagnosis not present

## 2020-11-04 DIAGNOSIS — R001 Bradycardia, unspecified: Secondary | ICD-10-CM | POA: Diagnosis not present

## 2020-11-04 DIAGNOSIS — M169 Osteoarthritis of hip, unspecified: Secondary | ICD-10-CM | POA: Diagnosis not present

## 2020-11-09 ENCOUNTER — Other Ambulatory Visit: Payer: Self-pay | Admitting: Student

## 2020-11-09 DIAGNOSIS — K4090 Unilateral inguinal hernia, without obstruction or gangrene, not specified as recurrent: Secondary | ICD-10-CM

## 2020-11-10 ENCOUNTER — Other Ambulatory Visit: Payer: Self-pay

## 2020-11-10 ENCOUNTER — Ambulatory Visit
Admission: RE | Admit: 2020-11-10 | Discharge: 2020-11-10 | Disposition: A | Discharge status: Home / Self Care | Payer: Medicare Other | Source: Ambulatory Visit | Attending: Student | Admitting: Student

## 2020-11-10 DIAGNOSIS — N281 Cyst of kidney, acquired: Secondary | ICD-10-CM | POA: Diagnosis not present

## 2020-11-10 DIAGNOSIS — K4090 Unilateral inguinal hernia, without obstruction or gangrene, not specified as recurrent: Secondary | ICD-10-CM

## 2020-11-18 ENCOUNTER — Other Ambulatory Visit (HOSPITAL_COMMUNITY): Payer: Self-pay | Admitting: General Surgery

## 2020-11-18 ENCOUNTER — Other Ambulatory Visit: Payer: Self-pay | Admitting: General Surgery

## 2020-11-18 DIAGNOSIS — D1724 Benign lipomatous neoplasm of skin and subcutaneous tissue of left leg: Secondary | ICD-10-CM

## 2020-11-27 ENCOUNTER — Other Ambulatory Visit: Payer: Self-pay

## 2020-11-27 ENCOUNTER — Other Ambulatory Visit (HOSPITAL_COMMUNITY): Payer: Self-pay | Admitting: General Surgery

## 2020-11-27 ENCOUNTER — Ambulatory Visit (HOSPITAL_COMMUNITY)
Admission: RE | Admit: 2020-11-27 | Discharge: 2020-11-27 | Disposition: A | Discharge status: Home / Self Care | Payer: Medicare Other | Source: Ambulatory Visit | Attending: General Surgery | Admitting: General Surgery

## 2020-11-27 DIAGNOSIS — M1712 Unilateral primary osteoarthritis, left knee: Secondary | ICD-10-CM | POA: Diagnosis not present

## 2020-11-27 DIAGNOSIS — D1724 Benign lipomatous neoplasm of skin and subcutaneous tissue of left leg: Secondary | ICD-10-CM | POA: Diagnosis not present

## 2020-11-27 DIAGNOSIS — M25452 Effusion, left hip: Secondary | ICD-10-CM | POA: Diagnosis not present

## 2020-11-27 DIAGNOSIS — D1779 Benign lipomatous neoplasm of other sites: Secondary | ICD-10-CM | POA: Diagnosis not present

## 2021-01-04 DIAGNOSIS — R2242 Localized swelling, mass and lump, left lower limb: Secondary | ICD-10-CM | POA: Diagnosis not present

## 2021-01-04 DIAGNOSIS — D179 Benign lipomatous neoplasm, unspecified: Secondary | ICD-10-CM | POA: Diagnosis not present

## 2021-01-07 DIAGNOSIS — Z87891 Personal history of nicotine dependence: Secondary | ICD-10-CM | POA: Diagnosis not present

## 2021-01-07 DIAGNOSIS — Z950 Presence of cardiac pacemaker: Secondary | ICD-10-CM | POA: Diagnosis not present

## 2021-01-07 DIAGNOSIS — N2889 Other specified disorders of kidney and ureter: Secondary | ICD-10-CM | POA: Diagnosis not present

## 2021-01-07 DIAGNOSIS — N289 Disorder of kidney and ureter, unspecified: Secondary | ICD-10-CM | POA: Diagnosis not present

## 2021-01-07 DIAGNOSIS — D481 Neoplasm of uncertain behavior of connective and other soft tissue: Secondary | ICD-10-CM | POA: Diagnosis not present

## 2021-01-07 DIAGNOSIS — R200 Anesthesia of skin: Secondary | ICD-10-CM | POA: Diagnosis not present

## 2021-01-11 ENCOUNTER — Telehealth: Payer: Self-pay | Admitting: *Deleted

## 2021-01-11 NOTE — Telephone Encounter (Signed)
Eliquis assistance has been approved through 10/30/2021

## 2021-01-19 ENCOUNTER — Ambulatory Visit (INDEPENDENT_AMBULATORY_CARE_PROVIDER_SITE_OTHER): Payer: Medicare Other

## 2021-01-19 DIAGNOSIS — I495 Sick sinus syndrome: Secondary | ICD-10-CM

## 2021-01-19 LAB — CUP PACEART REMOTE DEVICE CHECK
Battery Remaining Longevity: 139 mo
Battery Voltage: 3.03 V
Brady Statistic AP VP Percent: 1.1 %
Brady Statistic AP VS Percent: 97.45 %
Brady Statistic AS VP Percent: 0 %
Brady Statistic AS VS Percent: 1.44 %
Brady Statistic RA Percent Paced: 98.64 %
Brady Statistic RV Percent Paced: 1.11 %
Date Time Interrogation Session: 20220321220601
Implantable Lead Implant Date: 20110429
Implantable Lead Implant Date: 20201221
Implantable Lead Location: 753859
Implantable Lead Location: 753860
Implantable Lead Model: 5076
Implantable Pulse Generator Implant Date: 20201221
Lead Channel Impedance Value: 323 Ohm
Lead Channel Impedance Value: 437 Ohm
Lead Channel Impedance Value: 456 Ohm
Lead Channel Impedance Value: 703 Ohm
Lead Channel Pacing Threshold Amplitude: 0.625 V
Lead Channel Pacing Threshold Amplitude: 0.875 V
Lead Channel Pacing Threshold Pulse Width: 0.4 ms
Lead Channel Pacing Threshold Pulse Width: 0.4 ms
Lead Channel Sensing Intrinsic Amplitude: 2.125 mV
Lead Channel Sensing Intrinsic Amplitude: 2.125 mV
Lead Channel Sensing Intrinsic Amplitude: 7.375 mV
Lead Channel Sensing Intrinsic Amplitude: 7.375 mV
Lead Channel Setting Pacing Amplitude: 1.75 V
Lead Channel Setting Pacing Amplitude: 2.5 V
Lead Channel Setting Pacing Pulse Width: 0.4 ms
Lead Channel Setting Sensing Sensitivity: 1.2 mV

## 2021-01-27 NOTE — Progress Notes (Signed)
Remote pacemaker transmission.   

## 2021-02-01 ENCOUNTER — Ambulatory Visit (INDEPENDENT_AMBULATORY_CARE_PROVIDER_SITE_OTHER): Payer: Medicare Other | Admitting: Cardiovascular Disease

## 2021-02-01 ENCOUNTER — Other Ambulatory Visit: Payer: Self-pay

## 2021-02-01 ENCOUNTER — Encounter: Payer: Self-pay | Admitting: Cardiovascular Disease

## 2021-02-01 VITALS — BP 128/72 | HR 65 | Ht 69.5 in | Wt 163.2 lb

## 2021-02-01 DIAGNOSIS — I453 Trifascicular block: Secondary | ICD-10-CM | POA: Diagnosis not present

## 2021-02-01 DIAGNOSIS — I495 Sick sinus syndrome: Secondary | ICD-10-CM

## 2021-02-01 DIAGNOSIS — Z7901 Long term (current) use of anticoagulants: Secondary | ICD-10-CM

## 2021-02-01 DIAGNOSIS — Z950 Presence of cardiac pacemaker: Secondary | ICD-10-CM | POA: Diagnosis not present

## 2021-02-01 DIAGNOSIS — I480 Paroxysmal atrial fibrillation: Secondary | ICD-10-CM | POA: Diagnosis not present

## 2021-02-01 LAB — PACEMAKER DEVICE OBSERVATION

## 2021-02-01 MED ORDER — METOPROLOL SUCCINATE ER 25 MG PO TB24: 12.5000 mg | ORAL_TABLET | Freq: Every day | ORAL | 3 refills | Status: DC

## 2021-02-01 NOTE — Progress Notes (Signed)
Cardiology Office Note    Date:  02/05/2021   ID:  David Booth, DOB [birth year], MRN [id number]  PCP:  [name], MD  Cardiologist:   Sanda Klein, MD   Chief Complaint  Patient presents with  . Atrial Fibrillation    History of Present Illness:  David Booth is a 87 y.o. male with a history of tachycardia-bradycardia syndrome (paroxysmal atrial tachycardia and sinus node dysfunction), chronic right bundle branch block, left anterior fascicular block and long PR interval (trifascicular block) leading to implantation of a dual-chamber permanent pacemaker (recent generator change out with Medtronic Azure) and implantation of a new right ventricular lead Medtronic 5076, due to poor sensing/capture threshold on the initial RV lead (which was capped and left in place).  He is generally doing well without complaints.  As before, he has virtually 100% atrial pacing and no ventricular pacing.  Although his histograms are relatively blunted, he has not tolerated well aggressive sensor settings due to unpleasant sensation of rapid heartbeat.  Underlying rhythm is severe sinus bradycardia in the low 40s.  No atrial fibrillation is seen.  Estimated generator longevity is about 12 years.  He has been taking the metoprolol only once daily to avoid dizziness.  The patient specifically denies any chest pain at rest exertion, dyspnea at rest or with exertion, orthopnea, paroxysmal nocturnal dyspnea, syncope, palpitations, focal neurological deficits, intermittent claudication, lower extremity edema, unexplained weight gain, cough, hemoptysis or wheezing.  He has not had falls or bleeding problems.  His daughter David Booth is a chiropractic.    Past Medical History:  Diagnosis Date  . Arthritis   . Coronary artery disease    patent coronary arteries 12/11/00 LHC  . Hypertension   . Neuropathy   . Numbness in feet   . Pacemaker    Medtronic  . PAF (paroxysmal atrial fibrillation) ([place])      Past Surgical History:  Procedure Laterality Date  . CARDIAC CATHETERIZATION     10 + years  . INGUINAL HERNIA REPAIR Right 10/06/2020   Procedure: LAPAROSCOPIC RIGHT INGUINAL HERNIA REPAIR WITH MESH;  Surgeon: Ralene Ok, MD;  Location: [place];  Service: General;  Laterality: Right;  . INSERTION OF MESH Right 10/06/2020   Procedure: INSERTION OF MESH;  Surgeon: Ralene Ok, MD;  Location: [place];  Service: General;  Laterality: Right;  . LEAD REVISION/REPAIR N/A 10/21/2019   Procedure: LEAD REVISION/REPAIR;  Surgeon: Sanda Klein, MD;  Location: [place] [facility];  Service: Cardiovascular;  Laterality: N/A;  . pace maker    . PPM GENERATOR CHANGEOUT N/A 10/21/2019   Procedure: PPM GENERATOR CHANGEOUT;  Surgeon: Sanda Klein, MD;  Location: [place] [facility];  Service: Cardiovascular;  Laterality: N/A;    Current Medications: Outpatient Medications Prior to Visit  Medication Sig Dispense Refill  . Alpha-Lipoic Acid 100 MG TABS Take 100 mg by mouth 3 (three) times daily.     Marland Kitchen apixaban (ELIQUIS) 5 MG TABS tablet Take 1 tablet (5 mg total) by mouth 2 (two) times daily. 180 tablet 3  . b complex vitamins tablet Take 1 tablet by mouth daily.    . Cholecalciferol (VITAMIN D3) 2000 UNITS TABS Take 2,000 Units by mouth daily.     . Coenzyme Q10 (CO Q 10) 100 MG CAPS Take 100 mg by mouth daily.     Marland Kitchen EPINEPHrine (EPI-PEN) 0.3 mg/0.3 mL DEVI Inject 0.3 mg into the muscle as needed for anaphylaxis.     Marland Kitchen  Garlic 4696 MG CAPS Take 1,000 mg by mouth daily.    . Glucosamine-Chondroit-Vit C-Mn (GLUCOSAMINE CHONDR 1500 COMPLX) CAPS Take 1 capsule by mouth 2 (two) times daily.    Marland Kitchen loratadine (CLARITIN) 10 MG tablet Take 10 mg by mouth at bedtime.     . Lycopene 10 MG CAPS Take 10 mg by mouth daily.     Marland Kitchen MAGNESIUM PO Take 1 Dose by mouth 2 (two) times daily. Powder form (1 tablespoon)    . Melatonin 10 MG CAPS Take 10 mg by mouth at bedtime.    . Misc Natural Products (PROSTATE  HEALTH) CAPS Take 1 capsule by mouth 3 (three) times daily.    . Multiple Vitamin (MULTIVITAMIN WITH MINERALS) TABS tablet Take 1 tablet by mouth daily.    . Omega-3 Fatty Acids (FISH OIL) 1200 MG CAPS Take 1,200 mg by mouth daily.     . sildenafil (VIAGRA) 50 MG tablet Take 1 tablet (50 mg total) by mouth daily as needed for erectile dysfunction. 5 tablet 0  . traMADol (ULTRAM) 50 MG tablet Take 1 tablet (50 mg total) by mouth every 6 (six) hours as needed. 20 tablet 0  . vitamin C (ASCORBIC ACID) 250 MG tablet Take 250 mg by mouth 3 (three) times daily.     . vitamin E 180 MG (400 UNITS) capsule Take 400 Units by mouth 3 (three) times daily.     . metoprolol tartrate (LOPRESSOR) 25 MG tablet Take 0.5 tablets (12.5 mg total) by mouth 2 (two) times daily. 90 tablet 3   No facility-administered medications prior to visit.     Allergies:   Bee venom   Social History   Socioeconomic History  . Marital status: Divorced    Spouse name: Not on file  . Number of children: 2  . Years of education: college  . Highest education level: Not on file  Occupational History    Comment: retired  Tobacco Use  . Smoking status: Former Smoker    Years: 3.00    Types: Cigarettes  . Smokeless tobacco: Never Used  Vaping Use  . Vaping Use: Never used  Substance and Sexual Activity  . Alcohol use: No    Alcohol/week: 0.0 standard drinks  . Drug use: No  . Sexual activity: Not on file  Other Topics Concern  . Not on file  Social History Narrative   Patient lives at home alone with his girlfriend.   Retired.    Education. College education.   Right handed.   Caffeine None.   Social Determinants of Health   Financial Resource Strain: Not on file  Food Insecurity: Not on file  Transportation Needs: Not on file  Physical Activity: Not on file  Stress: Not on file  Social Connections: Not on file     Family History:  The patient's family history includes Alzheimer's disease in his mother;  Heart attack in his father.   ROS:   Please see the history of present illness.    All other systems are reviewed and are negative.  PHYSICAL EXAM:   VS:  BP 128/72   Pulse 65   Ht 5' 9.5" (1.765 m)   Wt 163 lb 3.2 oz (74 kg)   SpO2 99%   BMI 23.75 kg/m      General: Alert, oriented x3, no distress, healthy pacemaker site. Head: no evidence of trauma, PERRL, EOMI, no exophtalmos or lid lag, no myxedema, no xanthelasma; normal ears, nose and oropharynx Neck: normal  jugular venous pulsations and no hepatojugular reflux; brisk carotid pulses without delay and no carotid bruits Chest: clear to auscultation, no signs of consolidation by percussion or palpation, normal fremitus, symmetrical and full respiratory excursions Cardiovascular: normal position and quality of the apical impulse, regular rhythm, normal first and widely split second heart sounds, no murmurs, rubs or gallops Abdomen: no tenderness or distention, no masses by palpation, no abnormal pulsatility or arterial bruits, normal bowel sounds, no hepatosplenomegaly Extremities: no clubbing, cyanosis or edema; 2+ radial, ulnar and brachial pulses bilaterally; 2+ right femoral, posterior tibial and dorsalis pedis pulses; 2+ left femoral, posterior tibial and dorsalis pedis pulses; no subclavian or femoral bruits Neurological: grossly nonfocal Psych: Normal mood and affect    Wt Readings from Last 3 Encounters:  02/01/21 163 lb 3.2 oz (74 kg)  10/06/20 164 lb (74.4 kg)  09/29/20 162 lb 12.8 oz (73.8 kg)    Studies/Labs Reviewed:   EKG:  EKG is ordered today shows atrial paced, ventricular sensed rhythm with mildly prolonged AV delay of 216 ms, right bundle branch block (old), QTC 436 ms.  The current ECG does not show the previous pattern of left axis deviation due to left anterior fascicular block.  Lipid Panel  No results found for: CHOL, TRIG, HDL, CHOLHDL, VLDL, LDLCALC, LDLDIRECT, LABVLDL 05/22/2019 Total cholesterol  171, HDL 48, triglycerides 70 10/17/2019 Hemoglobin 13.7, creatinine 1.11, potassium 4.3, normal liver function tests, TSH 2.45 10/01/2020 Cholesterol 150, HDL 48, triglycerides 70, calculated LDL 90 09/29/2020 Hemoglobin 14.3, creatinine 1.07, potassium 4.2  ASSESSMENT:    1. Paroxysmal atrial fibrillation (HCC)   2. SSS (sick sinus syndrome) ([place])   3. Long term current use of anticoagulant   4. Pacemaker   5. Trifascicular block      PLAN:  In order of problems listed above:  1. Paroxysmal A. fib : Very low burden of arrhythmia, asymptomatic.  CHA2DS2-VASc score is at least 2-3 (he no longer requires antihypertensive medications, but did have high blood pressure in the past).   2. Anticoagulation: No bleeding complications 3. SSS: Attempts to make his rate response sensor more aggressive led to unpleasant symptoms. 4. PPM: Normal device function.  He does have an abandoned/5086 RV lead and therefore his pacemaker system is not MRI conditional.  Remote downloads every 3 months. 5. Chronic "trifascicular" block: RBBB, left axis deviation (intermittent left anterior fascicular block), long AV delay.  To date, very low need for ventricular pacing.  We will cut back his metoprolol to metoprolol succinate 12.5 mg once daily.     Medication Adjustments/Labs and Tests Ordered: Current medicines are reviewed at length with the patient today.  Concerns regarding medicines are outlined above.  Medication changes, Labs and Tests ordered today are listed in the Patient Instructions below. Patient Instructions  Medication Instructions:  STOP the Metoprolol Tartrate START Metoprolol Succinate 12.5 mg (half of a 25 mg tablet) once daily *If you need a refill on your cardiac medications before your next appointment, please call your pharmacy*   Lab Work: None ordered If you have labs (blood work) drawn today and your tests are completely normal, you will receive your results only  by: Marland Kitchen MyChart Message (if you have MyChart) OR . A paper copy in the mail If you have any lab test that is abnormal or we need to change your treatment, we will call you to review the results.   Testing/Procedures: None ordered   Follow-Up: At [facility], you and your health needs  are our priority.  As part of our continuing mission to provide you with exceptional heart care, we have created designated Provider Care Teams.  These Care Teams include your primary Cardiologist (physician) and Advanced Practice Providers (APPs -  Physician Assistants and Nurse Practitioners) who all work together to provide you with the care you need, when you need it.  We recommend signing up for the patient portal called "MyChart".  Sign up information is provided on this After Visit Summary.  MyChart is used to connect with patients for Virtual Visits (Telemedicine).  Patients are able to view lab/test results, encounter notes, upcoming appointments, etc.  Non-urgent messages can be sent to your provider as well.   To learn more about what you can do with MyChart, go to [email or web address].    Your next appointment:   12 month(s)  The format for your next appointment:   In Person  Provider:   Sanda Klein, MD       Signed, Sanda Klein, MD  02/05/2021 1:38 PM    [facility] [address], [place], [place]  [address] Phone: [phone]; Fax: [phone]

## 2021-02-01 NOTE — Patient Instructions (Signed)
Medication Instructions:  STOP the Metoprolol Tartrate START Metoprolol Succinate 12.5 mg (half of a 25 mg tablet) once daily *If you need a refill on your cardiac medications before your next appointment, please call your pharmacy*   Lab Work: None ordered If you have labs (blood work) drawn today and your tests are completely normal, you will receive your results only by: Marland Kitchen MyChart Message (if you have MyChart) OR . A paper copy in the mail If you have any lab test that is abnormal or we need to change your treatment, we will call you to review the results.   Testing/Procedures: None ordered   Follow-Up: At [facility], you and your health needs are our priority.  As part of our continuing mission to provide you with exceptional heart care, we have created designated Provider Care Teams.  These Care Teams include your primary Cardiologist (physician) and Advanced Practice Providers (APPs -  Physician Assistants and Nurse Practitioners) who all work together to provide you with the care you need, when you need it.  We recommend signing up for the patient portal called "MyChart".  Sign up information is provided on this After Visit Summary.  MyChart is used to connect with patients for Virtual Visits (Telemedicine).  Patients are able to view lab/test results, encounter notes, upcoming appointments, etc.  Non-urgent messages can be sent to your provider as well.   To learn more about what you can do with MyChart, go to [email or web address].    Your next appointment:   12 month(s)  The format for your next appointment:   In Person  Provider:   Sanda Klein, MD

## 2021-02-05 ENCOUNTER — Encounter: Payer: Self-pay | Admitting: Cardiovascular Disease

## 2021-02-25 DIAGNOSIS — Z23 Encounter for immunization: Secondary | ICD-10-CM | POA: Diagnosis not present

## 2021-04-01 DIAGNOSIS — I10 Essential (primary) hypertension: Secondary | ICD-10-CM | POA: Diagnosis not present

## 2021-04-01 DIAGNOSIS — E782 Mixed hyperlipidemia: Secondary | ICD-10-CM | POA: Diagnosis not present

## 2021-04-01 DIAGNOSIS — R001 Bradycardia, unspecified: Secondary | ICD-10-CM | POA: Diagnosis not present

## 2021-04-07 DIAGNOSIS — E1169 Type 2 diabetes mellitus with other specified complication: Secondary | ICD-10-CM | POA: Diagnosis not present

## 2021-04-07 DIAGNOSIS — R001 Bradycardia, unspecified: Secondary | ICD-10-CM | POA: Diagnosis not present

## 2021-04-07 DIAGNOSIS — D696 Thrombocytopenia, unspecified: Secondary | ICD-10-CM | POA: Diagnosis not present

## 2021-04-07 DIAGNOSIS — E782 Mixed hyperlipidemia: Secondary | ICD-10-CM | POA: Diagnosis not present

## 2021-04-07 DIAGNOSIS — I10 Essential (primary) hypertension: Secondary | ICD-10-CM | POA: Diagnosis not present

## 2021-04-07 DIAGNOSIS — Z7185 Encounter for immunization safety counseling: Secondary | ICD-10-CM | POA: Diagnosis not present

## 2021-04-19 ENCOUNTER — Other Ambulatory Visit: Payer: Self-pay | Admitting: Cardiovascular Disease

## 2021-04-20 ENCOUNTER — Ambulatory Visit (INDEPENDENT_AMBULATORY_CARE_PROVIDER_SITE_OTHER): Payer: Medicare Other

## 2021-04-20 DIAGNOSIS — I495 Sick sinus syndrome: Secondary | ICD-10-CM | POA: Diagnosis not present

## 2021-04-21 LAB — CUP PACEART REMOTE DEVICE CHECK
Battery Remaining Longevity: 139 mo
Battery Voltage: 3.02 V
Brady Statistic AP VP Percent: 0.56 %
Brady Statistic AP VS Percent: 93.44 %
Brady Statistic AS VP Percent: 0 %
Brady Statistic AS VS Percent: 6 %
Brady Statistic RA Percent Paced: 94.09 %
Brady Statistic RV Percent Paced: 0.56 %
Date Time Interrogation Session: 20220620002315
Implantable Lead Implant Date: 20110429
Implantable Lead Implant Date: 20201221
Implantable Lead Location: 753859
Implantable Lead Location: 753860
Implantable Lead Model: 5076
Implantable Pulse Generator Implant Date: 20201221
Lead Channel Impedance Value: 342 Ohm
Lead Channel Impedance Value: 437 Ohm
Lead Channel Impedance Value: 437 Ohm
Lead Channel Impedance Value: 665 Ohm
Lead Channel Pacing Threshold Amplitude: 0.625 V
Lead Channel Pacing Threshold Amplitude: 0.75 V
Lead Channel Pacing Threshold Pulse Width: 0.4 ms
Lead Channel Pacing Threshold Pulse Width: 0.4 ms
Lead Channel Sensing Intrinsic Amplitude: 2.375 mV
Lead Channel Sensing Intrinsic Amplitude: 2.375 mV
Lead Channel Sensing Intrinsic Amplitude: 6.875 mV
Lead Channel Sensing Intrinsic Amplitude: 6.875 mV
Lead Channel Setting Pacing Amplitude: 1.5 V
Lead Channel Setting Pacing Amplitude: 2.5 V
Lead Channel Setting Pacing Pulse Width: 0.4 ms
Lead Channel Setting Sensing Sensitivity: 1.2 mV

## 2021-05-10 NOTE — Progress Notes (Signed)
Remote pacemaker transmission.   

## 2021-05-12 DIAGNOSIS — H02403 Unspecified ptosis of bilateral eyelids: Secondary | ICD-10-CM | POA: Diagnosis not present

## 2021-05-12 DIAGNOSIS — H43813 Vitreous degeneration, bilateral: Secondary | ICD-10-CM | POA: Diagnosis not present

## 2021-05-12 DIAGNOSIS — H02051 Trichiasis without entropian right upper eyelid: Secondary | ICD-10-CM | POA: Diagnosis not present

## 2021-05-26 DIAGNOSIS — Z1339 Encounter for screening examination for other mental health and behavioral disorders: Secondary | ICD-10-CM | POA: Diagnosis not present

## 2021-05-26 DIAGNOSIS — Z1331 Encounter for screening for depression: Secondary | ICD-10-CM | POA: Diagnosis not present

## 2021-05-26 DIAGNOSIS — Z0000 Encounter for general adult medical examination without abnormal findings: Secondary | ICD-10-CM | POA: Diagnosis not present

## 2021-05-26 DIAGNOSIS — I10 Essential (primary) hypertension: Secondary | ICD-10-CM | POA: Diagnosis not present

## 2021-05-26 DIAGNOSIS — R000 Tachycardia, unspecified: Secondary | ICD-10-CM | POA: Diagnosis not present

## 2021-05-31 DIAGNOSIS — Z23 Encounter for immunization: Secondary | ICD-10-CM | POA: Diagnosis not present

## 2021-07-02 DIAGNOSIS — E782 Mixed hyperlipidemia: Secondary | ICD-10-CM | POA: Diagnosis not present

## 2021-07-02 DIAGNOSIS — E559 Vitamin D deficiency, unspecified: Secondary | ICD-10-CM | POA: Diagnosis not present

## 2021-07-02 DIAGNOSIS — D696 Thrombocytopenia, unspecified: Secondary | ICD-10-CM | POA: Diagnosis not present

## 2021-07-02 DIAGNOSIS — I10 Essential (primary) hypertension: Secondary | ICD-10-CM | POA: Diagnosis not present

## 2021-07-08 DIAGNOSIS — D696 Thrombocytopenia, unspecified: Secondary | ICD-10-CM | POA: Diagnosis not present

## 2021-07-08 DIAGNOSIS — E782 Mixed hyperlipidemia: Secondary | ICD-10-CM | POA: Diagnosis not present

## 2021-07-08 DIAGNOSIS — R000 Tachycardia, unspecified: Secondary | ICD-10-CM | POA: Diagnosis not present

## 2021-07-08 DIAGNOSIS — E559 Vitamin D deficiency, unspecified: Secondary | ICD-10-CM | POA: Diagnosis not present

## 2021-07-08 DIAGNOSIS — I10 Essential (primary) hypertension: Secondary | ICD-10-CM | POA: Diagnosis not present

## 2021-07-17 ENCOUNTER — Other Ambulatory Visit: Payer: Self-pay | Admitting: Cardiovascular Disease

## 2021-07-20 ENCOUNTER — Ambulatory Visit (INDEPENDENT_AMBULATORY_CARE_PROVIDER_SITE_OTHER): Payer: Medicare Other

## 2021-07-20 DIAGNOSIS — I495 Sick sinus syndrome: Secondary | ICD-10-CM | POA: Diagnosis not present

## 2021-07-21 LAB — CUP PACEART REMOTE DEVICE CHECK
Battery Remaining Longevity: 136 mo
Battery Voltage: 3.02 V
Brady Statistic AP VP Percent: 0.93 %
Brady Statistic AP VS Percent: 96.67 %
Brady Statistic AS VP Percent: 0 %
Brady Statistic AS VS Percent: 2.4 %
Brady Statistic RA Percent Paced: 97.71 %
Brady Statistic RV Percent Paced: 0.93 %
Date Time Interrogation Session: 20220917195244
Implantable Lead Implant Date: 20110429
Implantable Lead Implant Date: 20201221
Implantable Lead Location: 753859
Implantable Lead Location: 753860
Implantable Lead Model: 5076
Implantable Pulse Generator Implant Date: 20201221
Lead Channel Impedance Value: 342 Ohm
Lead Channel Impedance Value: 437 Ohm
Lead Channel Impedance Value: 437 Ohm
Lead Channel Impedance Value: 665 Ohm
Lead Channel Pacing Threshold Amplitude: 0.625 V
Lead Channel Pacing Threshold Amplitude: 0.75 V
Lead Channel Pacing Threshold Pulse Width: 0.4 ms
Lead Channel Pacing Threshold Pulse Width: 0.4 ms
Lead Channel Sensing Intrinsic Amplitude: 3.25 mV
Lead Channel Sensing Intrinsic Amplitude: 3.25 mV
Lead Channel Sensing Intrinsic Amplitude: 6.875 mV
Lead Channel Sensing Intrinsic Amplitude: 6.875 mV
Lead Channel Setting Pacing Amplitude: 1.5 V
Lead Channel Setting Pacing Amplitude: 2.5 V
Lead Channel Setting Pacing Pulse Width: 0.4 ms
Lead Channel Setting Sensing Sensitivity: 1.2 mV

## 2021-07-27 NOTE — Progress Notes (Signed)
Remote pacemaker transmission.   

## 2021-08-05 DIAGNOSIS — Z23 Encounter for immunization: Secondary | ICD-10-CM | POA: Diagnosis not present

## 2021-08-31 DIAGNOSIS — E782 Mixed hyperlipidemia: Secondary | ICD-10-CM | POA: Diagnosis not present

## 2021-08-31 DIAGNOSIS — I10 Essential (primary) hypertension: Secondary | ICD-10-CM | POA: Diagnosis not present

## 2021-09-06 DIAGNOSIS — E782 Mixed hyperlipidemia: Secondary | ICD-10-CM | POA: Diagnosis not present

## 2021-09-06 DIAGNOSIS — I10 Essential (primary) hypertension: Secondary | ICD-10-CM | POA: Diagnosis not present

## 2021-09-06 DIAGNOSIS — R251 Tremor, unspecified: Secondary | ICD-10-CM | POA: Diagnosis not present

## 2021-09-06 DIAGNOSIS — D696 Thrombocytopenia, unspecified: Secondary | ICD-10-CM | POA: Diagnosis not present

## 2021-09-08 ENCOUNTER — Encounter: Payer: Self-pay | Admitting: *Deleted

## 2021-09-10 ENCOUNTER — Other Ambulatory Visit: Payer: Self-pay | Admitting: *Deleted

## 2021-09-10 DIAGNOSIS — D696 Thrombocytopenia, unspecified: Secondary | ICD-10-CM

## 2021-09-10 NOTE — Progress Notes (Signed)
Spoke with patient and confirmed New Pt appt on 10/06/21 at 11:15 with Ned Card NP and lab appt

## 2021-10-05 NOTE — Progress Notes (Signed)
New Hematology/Oncology Consult   Requesting MD: Dr. Rachell Cipro  [phone]  Reason for Consult: Thrombocytopenia  HPI: David Booth is an 87 year old man referred for evaluation of thrombocytopenia.  He had a telehealth visit with Dr. Ernie Hew on 09/06/2021.  CBC completed on 09/01/2021 in anticipation of that visit returned with a hemoglobin of 13.3, white count 3.5, 35% neutrophils, 51% lymphocytes, platelet count 121,000; chemistry panel normal with the exception of a mildly decreased protein level at 5.9.  Review of CBCs in the EMR-09/30/2014 hemoglobin 14.5, white count 5.9, platelet count 144,000; 12/29/2017 hemoglobin 12.6, white count 5.8, platelet count 116,000; 10/17/2019 hemoglobin 13.7, white count 4.4, platelet count 150,000; 08/04/2020 hemoglobin 14.2, white count 5.2, platelet count 125,000; 09/29/2020 hemoglobin 14.3, white count 4.5, platelet count 125,000.  Past Medical History:  Diagnosis Date   Arthritis    Coronary artery disease    patent coronary arteries 12/11/00 LHC   Hypertension    Neuropathy    Numbness in feet    Pacemaker    Medtronic   PAF (paroxysmal atrial fibrillation) ([place])      Past Surgical History:  Procedure Laterality Date   CARDIAC CATHETERIZATION     10 + years   INGUINAL HERNIA REPAIR Right 10/06/2020   Procedure: LAPAROSCOPIC RIGHT INGUINAL HERNIA REPAIR WITH MESH;  Surgeon: Ralene Ok, MD;  Location: [place];  Service: General;  Laterality: Right;   INSERTION OF MESH Right 10/06/2020   Procedure: INSERTION OF MESH;  Surgeon: Ralene Ok, MD;  Location: [place];  Service: General;  Laterality: Right;   LEAD REVISION/REPAIR N/A 10/21/2019   Procedure: LEAD REVISION/REPAIR;  Surgeon: Sanda Klein, MD;  Location: [place] [facility];  Service: Cardiovascular;  Laterality: N/A;   pace maker     PPM GENERATOR CHANGEOUT N/A 10/21/2019   Procedure: PPM GENERATOR CHANGEOUT;  Surgeon: Sanda Klein, MD;  Location: [place] [facility];   Service: Cardiovascular;  Laterality: N/A;     Current Outpatient Medications:    Alpha-Lipoic Acid 100 MG TABS, Take 100 mg by mouth 3 (three) times daily. , Disp: , Rfl:    apixaban (ELIQUIS) 5 MG TABS tablet, Take 1 tablet (5 mg total) by mouth 2 (two) times daily., Disp: 180 tablet, Rfl: 3   b complex vitamins tablet, Take 1 tablet by mouth daily., Disp: , Rfl:    Cholecalciferol (VITAMIN D3) 2000 UNITS TABS, Take 2,000 Units by mouth daily. , Disp: , Rfl:    Coenzyme Q10 (CO Q 10) 100 MG CAPS, Take 100 mg by mouth daily. , Disp: , Rfl:    Garlic 1856 MG CAPS, Take 1,000 mg by mouth daily., Disp: , Rfl:    Glucosamine-Chondroit-Vit C-Mn (GLUCOSAMINE CHONDR 1500 COMPLX) CAPS, Take 1 capsule by mouth 2 (two) times daily., Disp: , Rfl:    loratadine (CLARITIN) 10 MG tablet, Take 10 mg by mouth at bedtime. , Disp: , Rfl:    Lycopene 10 MG CAPS, Take 10 mg by mouth daily. , Disp: , Rfl:    MAGNESIUM PO, Take 1 Dose by mouth 2 (two) times daily. Powder form (1 tablespoon), Disp: , Rfl:    Melatonin 10 MG CAPS, Take 10 mg by mouth at bedtime., Disp: , Rfl:    metoprolol succinate (TOPROL-XL) 25 MG 24 hr tablet, Take 0.5 tablets (12.5 mg total) by mouth daily. Take with or immediately following a meal., Disp: 45 tablet, Rfl: 3   Misc Natural Products (PROSTATE HEALTH) CAPS, Take 1 capsule by mouth 3 (three)  times daily., Disp: , Rfl:    Multiple Vitamin (MULTIVITAMIN WITH MINERALS) TABS tablet, Take 1 tablet by mouth daily., Disp: , Rfl:    Omega-3 Fatty Acids (FISH OIL) 1200 MG CAPS, Take 1,200 mg by mouth daily. , Disp: , Rfl:    rosuvastatin (CRESTOR) 5 MG tablet, Take 5 mg by mouth at bedtime., Disp: , Rfl:    vitamin C (ASCORBIC ACID) 250 MG tablet, Take 250 mg by mouth 3 (three) times daily. , Disp: , Rfl:    vitamin E 180 MG (400 UNITS) capsule, Take 400 Units by mouth 3 (three) times daily. , Disp: , Rfl:    EPINEPHrine (EPI-PEN) 0.3 mg/0.3 mL DEVI, Inject 0.3 mg into the muscle as needed  for anaphylaxis.  (Patient not taking: Reported on 10/06/2021), Disp: , Rfl:    sildenafil (VIAGRA) 50 MG tablet, Take 1 tablet (50 mg total) by mouth daily as needed for erectile dysfunction. (Patient not taking: Reported on 10/06/2021), Disp: 5 tablet, Rfl: 0:     Allergies  Allergen Reactions   Bee Venom Swelling    FH: No family history of cancer or blood disorder.  SOCIAL HISTORY: He lives in [place].  He has 2 daughters.  He is a retired Materials engineer.  Tobacco use as a teenager.  No EtOH use.  Review of Systems: He overall feels well.  He reports a good appetite.  No weight loss.  No fevers or sweats.  He denies pain.  No bleeding.  No skin rash.  No joint pain.  No unusual headaches.  No visual disturbance.  He denies dysphagia.  No nausea or vomiting.  No change in bowel habits.  No cough or shortness of breath.  No chest pain.  No leg swelling or calf pain.  Occasional urinary incontinence.  He reports neuropathy symptoms involving the feet for many years.  Physical Exam:  Blood pressure (!) 152/83, pulse 80, temperature 98.1 F (36.7 C), temperature source Oral, resp. rate 19, height 5' 9"  (1.753 m), weight 163 lb (73.9 kg), SpO2 100 %.  HEENT: No thrush or ulcers. Lungs: Lungs clear bilaterally. Cardiac: Regular rate and rhythm. Abdomen: No hepatosplenomegaly. Vascular: No leg edema. Lymph nodes: Soft, mobile left axillary lymph node versus prominent fat pad. Neurologic: Alert and oriented. Skin: No rash.  LABS:   Recent Labs    10/06/21 1105  WBC 4.9  HGB 13.9  HCT 43.5  PLT 143*    Peripheral blood smear-platelets appear mildly decreased, few large platelets, no platelet clumps; few ovalocytes and teardrops; a few 5 lobed polys.   RADIOLOGY:  No results found.  Assessment and Plan:   Thrombocytopenia Left axillary lymph node versus prominent fat pad on exam 10/06/2021 Hypertension CAD PAF Neuropathy  [name] has been referred for  evaluation of thrombocytopenia.  He has stable mild thrombocytopenia dating to at least 2015.  He had a mildly low total white count on the labs done by Dr. Ernie Hew 09/01/2021.  Repeat CBC in our office today shows mild thrombocytopenia consistent with prior results.  The white count is in normal range.  We reviewed potential etiologies for the thrombocytopenia including benign normal variant, MDS, ITP, lymphoproliferative process.  We are checking a B12 level and myeloma panel.  He may have a palpable left axillary lymph node versus prominent fat pad.  He will return for a CBC and follow-up visit in 4 months.  We are available to see him sooner if needed.  Patient seen with Dr.  Sherrill.    Ned Card, NP 10/06/2021, 12:08 PM   This was a shared visit with Ned Card.  Mr. Berti was interviewed and examined.  I reviewed the peripheral blood smear.  He is referred for evaluation of chronic thrombocytopenia.  The platelet count is mildly decreased today.  There is no obvious explanation for the mild thrombocytopenia.  This may be a normal variant.  The differential diagnosis includes chronic ITP and myelodysplasia.  He does not have evidence of chronic liver disease.  We obtained additional diagnostic evaluation today including a vitamin B12 level and myeloma panel.  I did not recommend a bone marrow biopsy or imaging studies at present.  He will follow-up in 4 months to reexamine the left axilla.  I was present for greater than 50% of today's visit.  I performed medical decision making.  Julieanne Manson, MD

## 2021-10-06 ENCOUNTER — Inpatient Hospital Stay: Payer: Medicare Other | Attending: Nurse Practitioner | Admitting: Nurse Practitioner

## 2021-10-06 ENCOUNTER — Inpatient Hospital Stay: Payer: Medicare Other

## 2021-10-06 ENCOUNTER — Other Ambulatory Visit: Payer: Self-pay | Admitting: *Deleted

## 2021-10-06 ENCOUNTER — Encounter: Payer: Self-pay | Admitting: Nurse Practitioner

## 2021-10-06 ENCOUNTER — Other Ambulatory Visit: Payer: Self-pay

## 2021-10-06 VITALS — BP 152/83 | HR 80 | Temp 98.1°F | Resp 19 | Ht 69.0 in | Wt 163.0 lb

## 2021-10-06 DIAGNOSIS — D696 Thrombocytopenia, unspecified: Secondary | ICD-10-CM

## 2021-10-06 LAB — CBC WITH DIFFERENTIAL (CANCER CENTER ONLY)
Abs Immature Granulocytes: 0.01 10*3/uL (ref 0.00–0.07)
Basophils Absolute: 0 10*3/uL (ref 0.0–0.1)
Basophils Relative: 1 %
Eosinophils Absolute: 0.1 10*3/uL (ref 0.0–0.5)
Eosinophils Relative: 1 %
HCT: 43.5 % (ref 39.0–52.0)
Hemoglobin: 13.9 g/dL (ref 13.0–17.0)
Immature Granulocytes: 0 %
Lymphocytes Relative: 43 %
Lymphs Abs: 2.1 10*3/uL (ref 0.7–4.0)
MCH: 30.5 pg (ref 26.0–34.0)
MCHC: 32 g/dL (ref 30.0–36.0)
MCV: 95.6 fL (ref 80.0–100.0)
Monocytes Absolute: 0.4 10*3/uL (ref 0.1–1.0)
Monocytes Relative: 8 %
Neutro Abs: 2.3 10*3/uL (ref 1.7–7.7)
Neutrophils Relative %: 47 %
Platelet Count: 143 10*3/uL — ABNORMAL LOW (ref 150–400)
RBC: 4.55 MIL/uL (ref 4.22–5.81)
RDW: 13.3 % (ref 11.5–15.5)
WBC Count: 4.9 10*3/uL (ref 4.0–10.5)
nRBC: 0 % (ref 0.0–0.2)

## 2021-10-06 LAB — SAVE SMEAR(SSMR), FOR PROVIDER SLIDE REVIEW

## 2021-10-06 LAB — VITAMIN B12: Vitamin B-12: 455 pg/mL (ref 180–914)

## 2021-10-07 LAB — KAPPA/LAMBDA LIGHT CHAINS
Kappa free light chain: 15.3 mg/L (ref 3.3–19.4)
Kappa, lambda light chain ratio: 1.56 (ref 0.26–1.65)
Lambda free light chains: 9.8 mg/L (ref 5.7–26.3)

## 2021-10-11 LAB — MULTIPLE MYELOMA PANEL, SERUM
Albumin SerPl Elph-Mcnc: 3.9 g/dL (ref 2.9–4.4)
Albumin/Glob SerPl: 1.5 (ref 0.7–1.7)
Alpha 1: 0.3 g/dL (ref 0.0–0.4)
Alpha2 Glob SerPl Elph-Mcnc: 0.7 g/dL (ref 0.4–1.0)
B-Globulin SerPl Elph-Mcnc: 1.2 g/dL (ref 0.7–1.3)
Gamma Glob SerPl Elph-Mcnc: 0.5 g/dL (ref 0.4–1.8)
Globulin, Total: 2.7 g/dL (ref 2.2–3.9)
IgA: 233 mg/dL (ref 61–437)
IgG (Immunoglobin G), Serum: 675 mg/dL (ref 603–1613)
IgM (Immunoglobulin M), Srm: 22 mg/dL (ref 15–143)
Total Protein ELP: 6.6 g/dL (ref 6.0–8.5)

## 2021-10-13 ENCOUNTER — Telehealth: Payer: Self-pay

## 2021-10-13 NOTE — Telephone Encounter (Signed)
TC to the Pt to let him know labs are normal Pt verbalizing understanding

## 2021-10-13 NOTE — Telephone Encounter (Signed)
-----   Message from Owens Shark, NP sent at 10/12/2021  4:48 PM EST ----- Please let him know labs we did last week all returned normal. F/u as scheduled.

## 2021-10-19 ENCOUNTER — Ambulatory Visit (INDEPENDENT_AMBULATORY_CARE_PROVIDER_SITE_OTHER): Payer: Medicare Other

## 2021-10-19 DIAGNOSIS — I495 Sick sinus syndrome: Secondary | ICD-10-CM

## 2021-10-19 LAB — CUP PACEART REMOTE DEVICE CHECK
Battery Remaining Longevity: 132 mo
Battery Voltage: 3.02 V
Brady Statistic AP VP Percent: 0.64 %
Brady Statistic AP VS Percent: 98.59 %
Brady Statistic AS VP Percent: 0 %
Brady Statistic AS VS Percent: 0.76 %
Brady Statistic RA Percent Paced: 99.29 %
Brady Statistic RV Percent Paced: 0.65 %
Date Time Interrogation Session: 20221219200339
Implantable Lead Implant Date: 20110429
Implantable Lead Implant Date: 20201221
Implantable Lead Location: 753859
Implantable Lead Location: 753860
Implantable Lead Model: 5076
Implantable Pulse Generator Implant Date: 20201221
Lead Channel Impedance Value: 304 Ohm
Lead Channel Impedance Value: 418 Ohm
Lead Channel Impedance Value: 437 Ohm
Lead Channel Impedance Value: 513 Ohm
Lead Channel Pacing Threshold Amplitude: 0.5 V
Lead Channel Pacing Threshold Amplitude: 0.75 V
Lead Channel Pacing Threshold Pulse Width: 0.4 ms
Lead Channel Pacing Threshold Pulse Width: 0.4 ms
Lead Channel Sensing Intrinsic Amplitude: 2.5 mV
Lead Channel Sensing Intrinsic Amplitude: 2.5 mV
Lead Channel Sensing Intrinsic Amplitude: 6.875 mV
Lead Channel Sensing Intrinsic Amplitude: 6.875 mV
Lead Channel Setting Pacing Amplitude: 1.5 V
Lead Channel Setting Pacing Amplitude: 2.5 V
Lead Channel Setting Pacing Pulse Width: 0.4 ms
Lead Channel Setting Sensing Sensitivity: 1.2 mV

## 2021-10-20 DIAGNOSIS — R251 Tremor, unspecified: Secondary | ICD-10-CM | POA: Diagnosis not present

## 2021-10-20 DIAGNOSIS — I10 Essential (primary) hypertension: Secondary | ICD-10-CM | POA: Diagnosis not present

## 2021-10-20 DIAGNOSIS — R000 Tachycardia, unspecified: Secondary | ICD-10-CM | POA: Diagnosis not present

## 2021-10-28 NOTE — Progress Notes (Signed)
Remote pacemaker transmission.   

## 2021-11-10 DIAGNOSIS — H43813 Vitreous degeneration, bilateral: Secondary | ICD-10-CM | POA: Diagnosis not present

## 2021-11-10 DIAGNOSIS — H02403 Unspecified ptosis of bilateral eyelids: Secondary | ICD-10-CM | POA: Diagnosis not present

## 2021-11-10 DIAGNOSIS — H02051 Trichiasis without entropian right upper eyelid: Secondary | ICD-10-CM | POA: Diagnosis not present

## 2021-11-25 ENCOUNTER — Ambulatory Visit (INDEPENDENT_AMBULATORY_CARE_PROVIDER_SITE_OTHER): Payer: Medicare Other | Admitting: Neurology

## 2021-11-25 ENCOUNTER — Telehealth: Payer: Self-pay | Admitting: Neurology

## 2021-11-25 ENCOUNTER — Encounter: Payer: Self-pay | Admitting: Neurology

## 2021-11-25 VITALS — BP 142/62 | HR 64 | Ht 69.0 in | Wt 164.0 lb

## 2021-11-25 DIAGNOSIS — G629 Polyneuropathy, unspecified: Secondary | ICD-10-CM

## 2021-11-25 DIAGNOSIS — G250 Essential tremor: Secondary | ICD-10-CM

## 2021-11-25 DIAGNOSIS — G3184 Mild cognitive impairment, so stated: Secondary | ICD-10-CM | POA: Diagnosis not present

## 2021-11-25 MED ORDER — PROPRANOLOL HCL 10 MG PO TABS: 20.0000 mg | ORAL_TABLET | Freq: Three times a day (TID) | ORAL | 11 refills | Status: DC

## 2021-11-25 NOTE — Progress Notes (Addendum)
Chief Complaint  Patient presents with   New Patient (Initial Visit)    Last seen in 2016 for neuropathy. New referral for tremors, present in his bilateral hands. He was taking generic Toprol XL 12.5mg  daily for BP. His PCP increased it to 25mg  daily to help with the tremors. No relief in symptoms.       ASSESSMENT AND PLAN  David Booth is a 88 y.o. male   Mild cognitive impairment  MoCA examination 21/30  MRI of the brain to rule out structural abnormality   Tremor  Most consistent with essential tremor, there was no parkinsonian features,  Propanolol 10 mg titrating to 2 tablets 3 times a day as needed,  He was taking metoprolol ER 25 mg for elevated blood pressure, the patient reported multiple measurement at home, blood pressure was within normal limit.  We will stop metoprolol, only taking propanolol as needed  DIAGNOSTIC DATA (LABS, IMAGING, TESTING) - I reviewed patient records, labs, notes, testing and imaging myself where available.   MEDICAL HISTORY:  David Booth is a 88 year old male,, seen in request by his primary care physician Dr. Rachell Cipro for evaluation of bilateral hands tremor, memory loss,  I reviewed and summarized the referring note. PMHX. Afib, Eliquis HTN S/p pacemaker, heart racing.  I saw him previously for in 2015-2016 for bilateral lower extremity paresthesia, EMG nerve conduction study at that time confirmed mild axonal sensorimotor polyneuropathy, laboratory evaluation showed mild elevated glucose A1c was 6.0  CT of the cervical spine showed degenerative changes, but no evidence of canal stenosis, he was functional well, lost follow-up since then, patient reported minimally progression of his lower extremity paresthesia, denies significant gait abnormality  He lives alone, recently drive to [place] to visit his daughter, denies difficulty walking, but over the past few years, he noticed gradual onset bilateral hands tremor, it is  difficult for him to have a needed handwriting, difficulty to put the screw on the head of the screwdriver, his brother was diagnosed Parkinson's disease, his mother suffered Alzheimer's disease  He retired as a Corporate treasurer, noted gradual onset memory loss over the past few years, often forget why he was going to room, he enjoyed furniture building at his garage,  Laboratory evaluations in November 2022, hemoglobin of 13.3, normal CMP, creatinine of 1.06, LDL 74,  PHYSICAL EXAM:   Vitals:   11/25/21 0928  BP: (!) 142/62  Pulse: 64  Weight: 164 lb (74.4 kg)  Height: 5\' 9"  (1.753 m)   Not recorded     Body mass index is 24.22 kg/m.  PHYSICAL EXAMNIATION:  Gen: NAD, conversant, well nourised, well groomed                     Cardiovascular: Regular rate rhythm, no peripheral edema, warm, nontender. Eyes: Conjunctivae clear without exudates or hemorrhage Neck: Supple, no carotid bruits. Pulmonary: Clear to auscultation bilaterally   NEUROLOGICAL EXAM:  MENTAL STATUS: Speech:    Speech is normal; fluent and spontaneous with normal comprehension.  Cognition:   Montreal Cognitive Assessment  11/25/2021  Visuospatial/ Executive (0/5) 2  Naming (0/3) 3  Attention: Read list of digits (0/2) 2  Attention: Read list of letters (0/1) 1  Attention: Serial 7 subtraction starting at 100 (0/3) 3  Language: Repeat phrase (0/2) 2  Language : Fluency (0/1) 0  Abstraction (0/2) 2  Delayed Recall (0/5) 0  Orientation (0/6) 6  Total 21    CRANIAL NERVES: CN  II: Visual fields are full to confrontation. Pupils are round equal and briskly reactive to light. CN III, IV, VI: extraocular movement are normal. No ptosis. CN V: Facial sensation is intact to light touch CN VII: Face is symmetric with normal eye closure  CN VIII: Hearing is normal to causal conversation. CN IX, X: Phonation is normal. CN XI: Head turning and shoulder shrug are intact  MOTOR: Mild bilateral hands  resting tremor, no weakness, no rigidity  REFLEXES: Reflexes are 2+ and symmetric at the biceps, triceps, knees, and absent at ankles. Plantar responses are flexor.  SENSORY: Length dependent decreased light touch, pinprick, vibratory sensation to mid shin level  COORDINATION: There is no trunk or limb dysmetria noted.  GAIT/STANCE: He can get up from seated position arm crossed, steady  REVIEW OF SYSTEMS:  Full 14 system review of systems performed and notable only for as above All other review of systems were negative.   ALLERGIES: Allergies  Allergen Reactions   Bee Venom Swelling    HOME MEDICATIONS: Current Outpatient Medications  Medication Sig Dispense Refill   Alpha-Lipoic Acid 100 MG TABS Take 100 mg by mouth 3 (three) times daily.      apixaban (ELIQUIS) 5 MG TABS tablet Take 1 tablet (5 mg total) by mouth 2 (two) times daily. 180 tablet 3   b complex vitamins tablet Take 1 tablet by mouth daily.     Cholecalciferol (VITAMIN D3) 2000 UNITS TABS Take 2,000 Units by mouth daily.      Coenzyme Q10 (CO Q 10) 100 MG CAPS Take 100 mg by mouth daily.      diclofenac Sodium (VOLTAREN) 1 % GEL Apply topically as needed (arthritis).     EPINEPHrine (EPI-PEN) 0.3 mg/0.3 mL DEVI Inject 0.3 mg into the muscle as needed for anaphylaxis.     Garlic 7001 MG CAPS Take 1,000 mg by mouth daily.     Glucosamine-Chondroit-Vit C-Mn (GLUCOSAMINE CHONDR 1500 COMPLX) CAPS Take 1 capsule by mouth 2 (two) times daily.     loratadine (CLARITIN) 10 MG tablet Take 10 mg by mouth at bedtime.      Lycopene 10 MG CAPS Take 10 mg by mouth daily.      MAGNESIUM PO Take 1 Dose by mouth 2 (two) times daily. Powder form (1 tablespoon)     Melatonin 10 MG CAPS Take 10 mg by mouth at bedtime.     Misc Natural Products (PROSTATE HEALTH) CAPS Take 1 capsule by mouth 3 (three) times daily.     Multiple Vitamin (MULTIVITAMIN WITH MINERALS) TABS tablet Take 1 tablet by mouth daily.     Omega-3 Fatty Acids  (FISH OIL) 1200 MG CAPS Take 1,200 mg by mouth daily.      Oxymetazoline HCl (UPNEEQ) 0.1 % SOLN Apply to eye as needed.     rosuvastatin (CRESTOR) 5 MG tablet Take 5 mg by mouth at bedtime.     sildenafil (VIAGRA) 50 MG tablet Take 1 tablet (50 mg total) by mouth daily as needed for erectile dysfunction. 5 tablet 0   vitamin C (ASCORBIC ACID) 250 MG tablet Take 250 mg by mouth 3 (three) times daily.      vitamin E 180 MG (400 UNITS) capsule Take 400 Units by mouth 3 (three) times daily.      metoprolol succinate (TOPROL-XL) 25 MG 24 hr tablet Take 0.5 tablets (12.5 mg total) by mouth daily. Take with or immediately following a meal. 45 tablet 3   No current facility-administered  medications for this visit.    PAST MEDICAL HISTORY: Past Medical History:  Diagnosis Date   Arthritis    Coronary artery disease    patent coronary arteries 12/11/00 LHC   Hypertension    Neuropathy    Numbness in feet    Pacemaker    Medtronic   PAF (paroxysmal atrial fibrillation) ([place])    Tremor     PAST SURGICAL HISTORY: Past Surgical History:  Procedure Laterality Date   CARDIAC CATHETERIZATION     10 + years   INGUINAL HERNIA REPAIR Right 10/06/2020   Procedure: LAPAROSCOPIC RIGHT INGUINAL HERNIA REPAIR WITH MESH;  Surgeon: Ralene Ok, MD;  Location: [place];  Service: General;  Laterality: Right;   INSERTION OF MESH Right 10/06/2020   Procedure: INSERTION OF MESH;  Surgeon: Ralene Ok, MD;  Location: [place] [place];  Service: General;  Laterality: Right;   LEAD REVISION/REPAIR N/A 10/21/2019   Procedure: LEAD REVISION/REPAIR;  Surgeon: Sanda Klein, MD;  Location: [facility];  Service: Cardiovascular;  Laterality: N/A;   pace maker     PPM GENERATOR CHANGEOUT N/A 10/21/2019   Procedure: PPM GENERATOR CHANGEOUT;  Surgeon: Sanda Klein, MD;  Location: [facility];  Service: Cardiovascular;  Laterality: N/A;    FAMILY HISTORY: Family History  Problem Relation Age of Onset    Alzheimer's disease Mother    Heart attack Father     SOCIAL HISTORY: Social History   Socioeconomic History   Marital status: Divorced    Spouse name: Not on file   Number of children: 2   Years of education: college   Highest education level: Not on file  Occupational History    Comment: retired  Tobacco Use   Smoking status: Former    Years: 3.00    Types: Cigarettes   Smokeless tobacco: Never  Vaping Use   Vaping Use: Never used  Substance and Sexual Activity   Alcohol use: No    Alcohol/week: 0.0 standard drinks   Drug use: No   Sexual activity: Not on file  Other Topics Concern   Not on file  Social History Narrative   Patient lives at home alone with his girlfriend.   Retired.    Education. College education.   Right handed.   Caffeine None.   Social Determinants of Health   Financial Resource Strain: Not on file  Food Insecurity: Not on file  Transportation Needs: Not on file  Physical Activity: Not on file  Stress: Not on file  Social Connections: Not on file  Intimate Partner Violence: Not on file      Marcial Pacas, M.D. Ph.D.  [facility] [address] [place], [place] [address] Ph: [phone] Fax: [phone]  CC:  [name], MD [address] [place],  [place] [address]  [name], MD

## 2021-11-25 NOTE — Telephone Encounter (Signed)
Medicare/bcbs supp no auth req, order sent to [facility] to schedule, they will reach out to the patient to schedule.

## 2021-11-25 NOTE — Patient Instructions (Signed)
Stop Metoprolol   Change to Propanolol 10mg  up to 2 tablets three times a day as needed for tremor

## 2021-11-29 ENCOUNTER — Encounter: Payer: Self-pay | Admitting: Cardiovascular Disease

## 2021-12-01 NOTE — Addendum Note (Signed)
Addended by: Ricci Barker on: 12/01/2021 10:11 AM   Modules accepted: Orders

## 2021-12-16 ENCOUNTER — Encounter: Payer: Self-pay | Admitting: Cardiovascular Disease

## 2021-12-22 ENCOUNTER — Other Ambulatory Visit: Payer: Self-pay | Admitting: *Deleted

## 2021-12-22 MED ORDER — APIXABAN 5 MG PO TABS: 5.0000 mg | ORAL_TABLET | Freq: Two times a day (BID) | ORAL | 3 refills | Status: DC

## 2021-12-23 ENCOUNTER — Telehealth: Payer: Self-pay | Admitting: *Deleted

## 2021-12-23 NOTE — Telephone Encounter (Signed)
Eliquis assistance has been faxed to [facility].

## 2021-12-29 DIAGNOSIS — R251 Tremor, unspecified: Secondary | ICD-10-CM | POA: Diagnosis not present

## 2021-12-29 DIAGNOSIS — M19012 Primary osteoarthritis, left shoulder: Secondary | ICD-10-CM | POA: Diagnosis not present

## 2021-12-29 DIAGNOSIS — I4891 Unspecified atrial fibrillation: Secondary | ICD-10-CM | POA: Diagnosis not present

## 2021-12-29 DIAGNOSIS — M25812 Other specified joint disorders, left shoulder: Secondary | ICD-10-CM | POA: Diagnosis not present

## 2021-12-29 DIAGNOSIS — M19019 Primary osteoarthritis, unspecified shoulder: Secondary | ICD-10-CM | POA: Diagnosis not present

## 2021-12-30 DIAGNOSIS — M25812 Other specified joint disorders, left shoulder: Secondary | ICD-10-CM | POA: Diagnosis not present

## 2021-12-30 DIAGNOSIS — M19012 Primary osteoarthritis, left shoulder: Secondary | ICD-10-CM | POA: Diagnosis not present

## 2022-01-12 ENCOUNTER — Other Ambulatory Visit: Payer: Self-pay | Admitting: Cardiovascular Disease

## 2022-01-13 NOTE — Telephone Encounter (Signed)
Prescription refill request for Eliquis received. ?Indication:Afib ?Last office visit:4/22 ?Scr:1.1 ?Age: 88 ?Weight:74.4 kg ? ?Prescription refilled ? ?

## 2022-01-17 ENCOUNTER — Telehealth: Payer: Self-pay | Admitting: Neurology

## 2022-01-17 DIAGNOSIS — G3184 Mild cognitive impairment, so stated: Secondary | ICD-10-CM

## 2022-01-17 DIAGNOSIS — G250 Essential tremor: Secondary | ICD-10-CM

## 2022-01-17 NOTE — Telephone Encounter (Signed)
Assistance has been approved through 10/30/22 ?

## 2022-01-17 NOTE — Telephone Encounter (Signed)
pacemaker and lead setup and has found that one of the leads is not safe to scan. Unfortunately we cannot perform the exam tomorrow. ? ? ?Left a message thrombosis, MRI department ? ?We will change to CT head without contrast ?

## 2022-01-17 NOTE — Progress Notes (Signed)
According to pacer team here at [facility], there is an issue with this patient's configuration that makes it unsafe for MRI scanning. Pt cannot have MRI. Dr. Krista Blue aware.  ?

## 2022-01-18 ENCOUNTER — Ambulatory Visit (HOSPITAL_COMMUNITY)
Admission: RE | Admit: 2022-01-18 | Discharge: 2022-01-18 | Disposition: A | Discharge status: Home / Self Care | Payer: Medicare Other | Source: Ambulatory Visit | Attending: Neurology | Admitting: Neurology

## 2022-01-18 ENCOUNTER — Encounter (HOSPITAL_COMMUNITY): Payer: Self-pay

## 2022-01-18 ENCOUNTER — Ambulatory Visit (INDEPENDENT_AMBULATORY_CARE_PROVIDER_SITE_OTHER): Payer: Medicare Other

## 2022-01-18 DIAGNOSIS — I495 Sick sinus syndrome: Secondary | ICD-10-CM

## 2022-01-18 NOTE — Telephone Encounter (Signed)
Noted,  ?Medicare/BCBS supp order sent to [facility] no auth req they will reach out to the patient to schedule.  ?

## 2022-01-19 LAB — CUP PACEART REMOTE DEVICE CHECK
Battery Remaining Longevity: 130 mo
Battery Voltage: 3.01 V
Brady Statistic AP VP Percent: 6.06 %
Brady Statistic AP VS Percent: 93.22 %
Brady Statistic AS VP Percent: 0.01 %
Brady Statistic AS VS Percent: 0.71 %
Brady Statistic RA Percent Paced: 99.35 %
Brady Statistic RV Percent Paced: 6.07 %
Date Time Interrogation Session: 20230320015112
Implantable Lead Implant Date: 20110429
Implantable Lead Implant Date: 20201221
Implantable Lead Location: 753859
Implantable Lead Location: 753860
Implantable Lead Model: 5076
Implantable Pulse Generator Implant Date: 20201221
Lead Channel Impedance Value: 323 Ohm
Lead Channel Impedance Value: 437 Ohm
Lead Channel Impedance Value: 456 Ohm
Lead Channel Impedance Value: 703 Ohm
Lead Channel Pacing Threshold Amplitude: 0.625 V
Lead Channel Pacing Threshold Amplitude: 0.75 V
Lead Channel Pacing Threshold Pulse Width: 0.4 ms
Lead Channel Pacing Threshold Pulse Width: 0.4 ms
Lead Channel Sensing Intrinsic Amplitude: 2.125 mV
Lead Channel Sensing Intrinsic Amplitude: 2.125 mV
Lead Channel Sensing Intrinsic Amplitude: 8 mV
Lead Channel Sensing Intrinsic Amplitude: 8 mV
Lead Channel Setting Pacing Amplitude: 1.5 V
Lead Channel Setting Pacing Amplitude: 2.5 V
Lead Channel Setting Pacing Pulse Width: 0.4 ms
Lead Channel Setting Sensing Sensitivity: 1.2 mV

## 2022-01-27 DIAGNOSIS — D481 Neoplasm of uncertain behavior of connective and other soft tissue: Secondary | ICD-10-CM | POA: Diagnosis not present

## 2022-01-27 DIAGNOSIS — R2242 Localized swelling, mass and lump, left lower limb: Secondary | ICD-10-CM | POA: Diagnosis not present

## 2022-01-27 DIAGNOSIS — M1612 Unilateral primary osteoarthritis, left hip: Secondary | ICD-10-CM | POA: Diagnosis not present

## 2022-02-01 NOTE — Progress Notes (Signed)
Remote pacemaker transmission.   

## 2022-02-04 ENCOUNTER — Ambulatory Visit
Admission: RE | Admit: 2022-02-04 | Discharge: 2022-02-04 | Disposition: A | Discharge status: Home / Self Care | Payer: Medicare Other | Source: Ambulatory Visit | Attending: Neurology | Admitting: Neurology

## 2022-02-04 DIAGNOSIS — G250 Essential tremor: Secondary | ICD-10-CM

## 2022-02-04 DIAGNOSIS — G3184 Mild cognitive impairment, so stated: Secondary | ICD-10-CM

## 2022-02-04 DIAGNOSIS — R413 Other amnesia: Secondary | ICD-10-CM | POA: Diagnosis not present

## 2022-02-07 ENCOUNTER — Inpatient Hospital Stay: Payer: Medicare Other | Attending: Oncology

## 2022-02-07 ENCOUNTER — Inpatient Hospital Stay (HOSPITAL_BASED_OUTPATIENT_CLINIC_OR_DEPARTMENT_OTHER): Payer: Medicare Other | Admitting: Oncology

## 2022-02-07 VITALS — BP 136/81 | HR 78 | Temp 97.9°F | Resp 18 | Ht 69.0 in | Wt 162.0 lb

## 2022-02-07 DIAGNOSIS — D696 Thrombocytopenia, unspecified: Secondary | ICD-10-CM | POA: Diagnosis not present

## 2022-02-07 LAB — CBC WITH DIFFERENTIAL (CANCER CENTER ONLY)
Abs Immature Granulocytes: 0.01 10*3/uL (ref 0.00–0.07)
Basophils Absolute: 0 10*3/uL (ref 0.0–0.1)
Basophils Relative: 1 %
Eosinophils Absolute: 0.1 10*3/uL (ref 0.0–0.5)
Eosinophils Relative: 2 %
HCT: 43 % (ref 39.0–52.0)
Hemoglobin: 13.6 g/dL (ref 13.0–17.0)
Immature Granulocytes: 0 %
Lymphocytes Relative: 39 %
Lymphs Abs: 1.5 10*3/uL (ref 0.7–4.0)
MCH: 30.2 pg (ref 26.0–34.0)
MCHC: 31.6 g/dL (ref 30.0–36.0)
MCV: 95.3 fL (ref 80.0–100.0)
Monocytes Absolute: 0.4 10*3/uL (ref 0.1–1.0)
Monocytes Relative: 10 %
Neutro Abs: 1.9 10*3/uL (ref 1.7–7.7)
Neutrophils Relative %: 48 %
Platelet Count: 110 10*3/uL — ABNORMAL LOW (ref 150–400)
RBC: 4.51 MIL/uL (ref 4.22–5.81)
RDW: 13.3 % (ref 11.5–15.5)
WBC Count: 3.9 10*3/uL — ABNORMAL LOW (ref 4.0–10.5)
nRBC: 0 % (ref 0.0–0.2)

## 2022-02-07 NOTE — Progress Notes (Signed)
?  [facility] ?OFFICE PROGRESS NOTE ? ? ?Diagnosis: Thrombocytopenia ? ?INTERVAL HISTORY:  ? ?David Booth returns as scheduled.  He feels well.  No complaint.  No bleeding.  No fever or night sweats. ? ?Objective: ? ?Vital signs in last 24 hours: ? ?Blood pressure 136/81, pulse 78, temperature 97.9 ?F (36.6 ?C), temperature source Oral, resp. rate 18, height '5\' 9"'$  (1.753 m), weight 162 lb (73.5 kg), SpO2 100 %. ?  ? ? ?Lymphatics: No cervical, supraclavicular, axillary, or inguinal nodes ?Resp: Lungs clear bilaterally ?Cardio: Regular rate and rhythm ?GI: No hepatosplenomegaly ?Vascular: No leg edema ? ? ?Lab Results: ? ?Lab Results  ?Component Value Date  ? WBC 3.9 (L) 02/07/2022  ? HGB 13.6 02/07/2022  ? HCT 43.0 02/07/2022  ? MCV 95.3 02/07/2022  ? PLT 110 (L) 02/07/2022  ? NEUTROABS 1.9 02/07/2022  ? ? ?CMP  ?Lab Results  ?Component Value Date  ? NA 141 09/29/2020  ? K 4.2 09/29/2020  ? CL 104 09/29/2020  ? CO2 27 09/29/2020  ? GLUCOSE 95 09/29/2020  ? BUN 14 09/29/2020  ? CREATININE 1.07 09/29/2020  ? CALCIUM 9.5 09/29/2020  ? PROT 6.8 09/30/2014  ? ALBUMIN 4.6 09/30/2014  ? AST 30 09/30/2014  ? ALT 15 09/30/2014  ? ALKPHOS 69 09/30/2014  ? BILITOT 0.4 09/30/2014  ? GFRNONAA >60 09/29/2020  ? GFRAA 70 10/17/2019  ? ? ?Imaging: ? ?CT HEAD WO CONTRAST (5MM) ? ?Result Date: 02/04/2022 ? [facility] [address], [place], [place] [address] [phone] NEUROIMAGING REPORT STUDY DATE: 02/04/2022 PATIENT NAME: David Booth DOB: 11-Mar-1933 MRN: [id number] EXAM: CT of the head without contrast ORDERING CLINICIAN: Marcial Pacas MD, PhD CLINICAL HISTORY: 88 year old man with tremor and mild cognitive impairment COMPARISON FILMS: 12/29/2017 TECHNIQUE: CT scan of the head was obtained utilizing 5 mm axial slices from the skull base to the vertex. CONTRAST: None IMAGING SITE: [facility],  [address] FINDINGS: No acute findings.  Minimal generalized cortical atrophy, probably normal for  age.  This appears just slightly progressed compared to the 2019 CT scan.  No extra-axial fluid collections are seen.  No intracranial hemorrhage.  No evidence of mass effect or midline shift.  The cerebellum, brainstem and deep gray matter appears normal. The hemispheres appear normal.  There is calcification posterior to the vermis, unchanged compared to the previous CT scan.  The orbits and their contents, paranasal sinuses and calvarium are unremarkable.   ? ?This CT scan of the head without contrast shows the following: 1.   Fairly mild generalized cortical atrophy, likely typical for age and just minimally progressed compared to the 2019 CT scan. 2.   No acute findings. INTERPRETING PHYSICIAN: David A. Felecia Shelling, MD, PhD, FAAN Certified in  [place] by [employer]   ? ?Medications: I have reviewed the patient's current medications. ? ? ?Assessment/Plan: ? ?Thrombocytopenia ?Left axillary lymph node versus prominent fat pad on exam 10/06/2021, not palpated on exam 02/07/2022 ?Hypertension ?CAD ?PAF ?Neuropathy ? ? ?Disposition: ?David Booth appears stable.  He has chronic mild thrombocytopenia.  A myeloma panel was negative when he was here in December.  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  He will call for bleeding or bruising.  He will return for an office visit and CBC in 4 months. ? ?Betsy Coder, MD ? ?02/07/2022  ?12:00 PM ? ? ?

## 2022-02-08 ENCOUNTER — Encounter: Payer: Self-pay | Admitting: Oncology

## 2022-02-18 DIAGNOSIS — J309 Allergic rhinitis, unspecified: Secondary | ICD-10-CM | POA: Diagnosis not present

## 2022-02-18 DIAGNOSIS — R251 Tremor, unspecified: Secondary | ICD-10-CM | POA: Diagnosis not present

## 2022-02-18 DIAGNOSIS — D696 Thrombocytopenia, unspecified: Secondary | ICD-10-CM | POA: Diagnosis not present

## 2022-02-18 DIAGNOSIS — R351 Nocturia: Secondary | ICD-10-CM | POA: Diagnosis not present

## 2022-02-18 DIAGNOSIS — Z79899 Other long term (current) drug therapy: Secondary | ICD-10-CM | POA: Diagnosis not present

## 2022-02-18 DIAGNOSIS — I4891 Unspecified atrial fibrillation: Secondary | ICD-10-CM | POA: Diagnosis not present

## 2022-02-18 DIAGNOSIS — R040 Epistaxis: Secondary | ICD-10-CM | POA: Diagnosis not present

## 2022-03-21 DIAGNOSIS — D696 Thrombocytopenia, unspecified: Secondary | ICD-10-CM | POA: Diagnosis not present

## 2022-03-23 DIAGNOSIS — D696 Thrombocytopenia, unspecified: Secondary | ICD-10-CM | POA: Diagnosis not present

## 2022-03-23 DIAGNOSIS — I4891 Unspecified atrial fibrillation: Secondary | ICD-10-CM | POA: Diagnosis not present

## 2022-03-23 DIAGNOSIS — Z7185 Encounter for immunization safety counseling: Secondary | ICD-10-CM | POA: Diagnosis not present

## 2022-03-23 DIAGNOSIS — Z79899 Other long term (current) drug therapy: Secondary | ICD-10-CM | POA: Diagnosis not present

## 2022-03-23 DIAGNOSIS — Z23 Encounter for immunization: Secondary | ICD-10-CM | POA: Diagnosis not present

## 2022-03-23 DIAGNOSIS — N3941 Urge incontinence: Secondary | ICD-10-CM | POA: Diagnosis not present

## 2022-03-23 DIAGNOSIS — R040 Epistaxis: Secondary | ICD-10-CM | POA: Diagnosis not present

## 2022-04-14 DIAGNOSIS — E559 Vitamin D deficiency, unspecified: Secondary | ICD-10-CM | POA: Diagnosis not present

## 2022-04-14 DIAGNOSIS — E782 Mixed hyperlipidemia: Secondary | ICD-10-CM | POA: Diagnosis not present

## 2022-04-14 DIAGNOSIS — I10 Essential (primary) hypertension: Secondary | ICD-10-CM | POA: Diagnosis not present

## 2022-04-14 DIAGNOSIS — D696 Thrombocytopenia, unspecified: Secondary | ICD-10-CM | POA: Diagnosis not present

## 2022-04-20 DIAGNOSIS — I4891 Unspecified atrial fibrillation: Secondary | ICD-10-CM | POA: Diagnosis not present

## 2022-04-20 DIAGNOSIS — I10 Essential (primary) hypertension: Secondary | ICD-10-CM | POA: Diagnosis not present

## 2022-04-20 DIAGNOSIS — N3941 Urge incontinence: Secondary | ICD-10-CM | POA: Diagnosis not present

## 2022-04-20 DIAGNOSIS — D696 Thrombocytopenia, unspecified: Secondary | ICD-10-CM | POA: Diagnosis not present

## 2022-04-20 DIAGNOSIS — R251 Tremor, unspecified: Secondary | ICD-10-CM | POA: Diagnosis not present

## 2022-04-20 DIAGNOSIS — E559 Vitamin D deficiency, unspecified: Secondary | ICD-10-CM | POA: Diagnosis not present

## 2022-04-20 DIAGNOSIS — E782 Mixed hyperlipidemia: Secondary | ICD-10-CM | POA: Diagnosis not present

## 2022-05-11 DIAGNOSIS — H02051 Trichiasis without entropian right upper eyelid: Secondary | ICD-10-CM | POA: Diagnosis not present

## 2022-05-11 DIAGNOSIS — H02403 Unspecified ptosis of bilateral eyelids: Secondary | ICD-10-CM | POA: Diagnosis not present

## 2022-05-11 DIAGNOSIS — H43813 Vitreous degeneration, bilateral: Secondary | ICD-10-CM | POA: Diagnosis not present

## 2022-05-11 DIAGNOSIS — H02881 Meibomian gland dysfunction right upper eyelid: Secondary | ICD-10-CM | POA: Diagnosis not present

## 2022-05-26 ENCOUNTER — Encounter: Payer: Self-pay | Admitting: Neurology

## 2022-05-26 ENCOUNTER — Ambulatory Visit (INDEPENDENT_AMBULATORY_CARE_PROVIDER_SITE_OTHER): Payer: Medicare Other | Admitting: Neurology

## 2022-05-26 VITALS — BP 144/87 | HR 69 | Ht 69.0 in | Wt 159.0 lb

## 2022-05-26 DIAGNOSIS — G3184 Mild cognitive impairment, so stated: Secondary | ICD-10-CM | POA: Diagnosis not present

## 2022-05-26 DIAGNOSIS — G250 Essential tremor: Secondary | ICD-10-CM | POA: Diagnosis not present

## 2022-05-26 MED ORDER — PROPRANOLOL HCL 20 MG PO TABS: 20.0000 mg | ORAL_TABLET | Freq: Two times a day (BID) | ORAL | 3 refills | Status: AC

## 2022-05-26 NOTE — Progress Notes (Signed)
Chief Complaint  Patient presents with   Follow-up    Rm 15. Alone. Tremors have reduced with propranolol. [place] 22/30.   ASSESSMENT AND PLAN  David Booth is a 88 y.o. male   Mild cognitive impairment  MoCA examination 22/30, mainly short-term memory loss,  Likely very early stages of central nervous system degenerative disorder,  CT of the brain to rule out structural abnormality, not candidate for MRI due to pacemaker placement  Previous laboratory evaluation showed no significant abnormality,  Encouraged her not only mentally and physically active, to be socially active as well   Tremor  Most consistent with essential tremor, there was no parkinsonian features,  Much improved with higher dose of propranolol 20 mg up to 3 times a day, tolerating is well,    DIAGNOSTIC DATA (LABS, IMAGING, TESTING) - I reviewed patient records, labs, notes, testing and imaging myself where available.   MEDICAL HISTORY:  David Booth is a 88 year old male,, seen in request by his primary care physician Dr. Rachell Cipro for evaluation of bilateral hands tremor, memory loss,  I reviewed and summarized the referring note. PMHX. Afib, Eliquis HTN S/p pacemaker, heart racing.  I saw him previously for in 2015-2016 for bilateral lower extremity paresthesia, EMG nerve conduction study at that time confirmed mild axonal sensorimotor polyneuropathy, laboratory evaluation showed mild elevated glucose A1c was 6.0  CT of the cervical spine showed degenerative changes, but no evidence of canal stenosis, he was functional well, lost follow-up since then, patient reported minimally progression of his lower extremity paresthesia, denies significant gait abnormality  He lives alone, recently drive to [place] to visit his daughter, denies difficulty walking, but over the past few years, he noticed gradual onset bilateral hands tremor, it is difficult for him to have a needed handwriting, difficulty to  put the screw on the head of the screwdriver, his brother was diagnosed Parkinson's disease, his mother suffered Alzheimer's disease  He retired as a Corporate treasurer, noted gradual onset memory loss over the past few years, often forget why he was going to room, he enjoyed furniture building at his garage,  Laboratory evaluations in November 2022, hemoglobin of 13.3, normal CMP, creatinine of 1.06, LDL 74,  Update May 26, 2022: He was put on higher dose of Inderal 20 mg 3 times a day for tremor, which has helped him better, he enjoying working on his furniture project, he can do his measurements much better  Continue complains of mild cognitive malfunction, MoCA examination 22/30, he has 2 daughters living out of state, he had frequent interaction with them, but otherwise spent most of the time at home, he keeps himself physically active, also recently joined a book  Personally reviewed CT brain without contrast April 2023 no significant abnormality Normal B12, CBC,  PHYSICAL EXAM:   Vitals:   05/26/22 1136  BP: (!) 144/87  Pulse: 69  Weight: 159 lb (72.1 kg)  Height: '5\' 9"'$  (1.753 m)   Not recorded     Body mass index is 23.48 kg/m.  PHYSICAL EXAMNIATION:  Gen: NAD, conversant, well nourised, well groomed                     Cardiovascular: Regular rate rhythm, no peripheral edema, warm, nontender. Eyes: Conjunctivae clear without exudates or hemorrhage Neck: Supple, no carotid bruits. Pulmonary: Clear to auscultation bilaterally   NEUROLOGICAL EXAM:  MENTAL STATUS: Speech:    Speech is normal; fluent and spontaneous with normal comprehension.  Cognition:      05/26/2022   11:40 AM 11/25/2021    5:00 PM  Montreal Cognitive Assessment   Visuospatial/ Executive (0/5) 4 2  Naming (0/3) 2 3  Attention: Read list of digits (0/2) 1 2  Attention: Read list of letters (0/1) 1 1  Attention: Serial 7 subtraction starting at 100 (0/3) 3 3  Language: Repeat phrase  (0/2) 2 2  Language : Fluency (0/1) 0 0  Abstraction (0/2) 2 2  Delayed Recall (0/5) 1 0  Orientation (0/6) 6 6  Total 22 21    CRANIAL NERVES: CN II: Visual fields are full to confrontation. Pupils are round equal and briskly reactive to light. CN III, IV, VI: extraocular movement are normal. No ptosis. CN V: Facial sensation is intact to light touch CN VII: Face is symmetric with normal eye closure  CN VIII: Hearing is normal to causal conversation. CN IX, X: Phonation is normal. CN XI: Head turning and shoulder shrug are intact  MOTOR: Mild bilateral hands resting tremor, no weakness, no rigidity  REFLEXES: Reflexes are 2+ and symmetric at the biceps, triceps, knees, and absent at ankles. Plantar responses are flexor.  SENSORY: Length dependent decreased light touch, pinprick, vibratory sensation to mid shin level  COORDINATION: There is no trunk or limb dysmetria noted.  GAIT/STANCE: He can get up from seated position arm crossed, steady  REVIEW OF SYSTEMS:  Full 14 system review of systems performed and notable only for as above All other review of systems were negative.   ALLERGIES: Allergies  Allergen Reactions   Bee Venom Swelling    HOME MEDICATIONS: Current Outpatient Medications  Medication Sig Dispense Refill   Alpha-Lipoic Acid 100 MG TABS Take 100 mg by mouth 3 (three) times daily.      apixaban (ELIQUIS) 5 MG TABS tablet TAKE 1 TABLET BY MOUTH TWICE DAILY 180 tablet 1   b complex vitamins tablet Take 1 tablet by mouth daily.     Cholecalciferol (VITAMIN D3) 2000 UNITS TABS Take 2,000 Units by mouth daily.      Coenzyme Q10 (CO Q 10) 100 MG CAPS Take 100 mg by mouth daily.      diclofenac Sodium (VOLTAREN) 1 % GEL Apply topically as needed (arthritis).     EPINEPHrine (EPI-PEN) 0.3 mg/0.3 mL DEVI Inject 0.3 mg into the muscle as needed for anaphylaxis.     erythromycin ophthalmic ointment Place into the right eye every evening.     Garlic 5284 MG CAPS  Take 1,000 mg by mouth daily.     Glucosamine-Chondroit-Vit C-Mn (GLUCOSAMINE CHONDR 1500 COMPLX) CAPS Take 1 capsule by mouth 2 (two) times daily.     loratadine (CLARITIN) 10 MG tablet Take 10 mg by mouth at bedtime.      Lycopene 10 MG CAPS Take 10 mg by mouth daily.      MAGNESIUM PO Take 1 Dose by mouth 2 (two) times daily. Powder form (1 tablespoon)     Melatonin 10 MG CAPS Take 10 mg by mouth at bedtime.     Misc Natural Products (PROSTATE HEALTH) CAPS Take 1 capsule by mouth 3 (three) times daily.     Multiple Vitamin (MULTIVITAMIN WITH MINERALS) TABS tablet Take 1 tablet by mouth daily.     Omega-3 Fatty Acids (FISH OIL) 1200 MG CAPS Take 1,200 mg by mouth daily.      oxybutynin (DITROPAN-XL) 5 MG 24 hr tablet Take 5 mg by mouth daily.     Oxymetazoline  HCl (UPNEEQ) 0.1 % SOLN Apply to eye as needed.     propranolol (INDERAL) 20 MG tablet Take 20 mg by mouth 3 (three) times daily.     rosuvastatin (CRESTOR) 5 MG tablet Take 5 mg by mouth at bedtime.     sildenafil (VIAGRA) 50 MG tablet Take 1 tablet (50 mg total) by mouth daily as needed for erectile dysfunction. 5 tablet 0   vitamin C (ASCORBIC ACID) 250 MG tablet Take 250 mg by mouth 3 (three) times daily.      vitamin E 180 MG (400 UNITS) capsule Take 400 Units by mouth 3 (three) times daily.      No current facility-administered medications for this visit.    PAST MEDICAL HISTORY: Past Medical History:  Diagnosis Date   Arthritis    Coronary artery disease    patent coronary arteries 12/11/00 LHC   Hypertension    Neuropathy    Numbness in feet    Pacemaker    Medtronic   PAF (paroxysmal atrial fibrillation) ([place])    Tremor     PAST SURGICAL HISTORY: Past Surgical History:  Procedure Laterality Date   CARDIAC CATHETERIZATION     10 + years   INGUINAL HERNIA REPAIR Right 10/06/2020   Procedure: LAPAROSCOPIC RIGHT INGUINAL HERNIA REPAIR WITH MESH;  Surgeon: Ralene Ok, MD;  Location: [facility];  Service: General;   Laterality: Right;   INSERTION OF MESH Right 10/06/2020   Procedure: INSERTION OF MESH;  Surgeon: Ralene Ok, MD;  Location: [place];  Service: General;  Laterality: Right;   LEAD REVISION/REPAIR N/A 10/21/2019   Procedure: LEAD REVISION/REPAIR;  Surgeon: Sanda Klein, MD;  Location: [facility];  Service: Cardiovascular;  Laterality: N/A;   pace maker     PPM GENERATOR CHANGEOUT N/A 10/21/2019   Procedure: PPM GENERATOR CHANGEOUT;  Surgeon: Sanda Klein, MD;  Location: [facility];  Service: Cardiovascular;  Laterality: N/A;    FAMILY HISTORY: Family History  Problem Relation Age of Onset   Alzheimer's disease Mother    Heart attack Father     SOCIAL HISTORY: Social History   Socioeconomic History   Marital status: Divorced    Spouse name: Not on file   Number of children: 2   Years of education: college   Highest education level: Not on file  Occupational History    Comment: retired  Tobacco Use   Smoking status: Former    Years: 3.00    Types: Cigarettes   Smokeless tobacco: Never  Vaping Use   Vaping Use: Never used  Substance and Sexual Activity   Alcohol use: No    Alcohol/week: 0.0 standard drinks of alcohol   Drug use: No   Sexual activity: Not on file  Other Topics Concern   Not on file  Social History Narrative   Patient lives at home alone with his girlfriend.   Retired.    Education. College education.   Right handed.   Caffeine None.   Social Determinants of Health   Financial Resource Strain: Not on file  Food Insecurity: Not on file  Transportation Needs: Not on file  Physical Activity: Not on file  Stress: Not on file  Social Connections: Not on file  Intimate Partner Violence: Not on file      Marcial Pacas, M.D. Ph.D.  [facility] [address], [place], [place] [address] Ph: [phone] Fax: [phone]  CC:  [name], MD [phone] [place],  [place] [address]  [name], MD

## 2022-05-29 ENCOUNTER — Other Ambulatory Visit: Payer: Self-pay | Admitting: Neurology

## 2022-06-01 DIAGNOSIS — Z1339 Encounter for screening examination for other mental health and behavioral disorders: Secondary | ICD-10-CM | POA: Diagnosis not present

## 2022-06-01 DIAGNOSIS — N3941 Urge incontinence: Secondary | ICD-10-CM | POA: Diagnosis not present

## 2022-06-01 DIAGNOSIS — Z1331 Encounter for screening for depression: Secondary | ICD-10-CM | POA: Diagnosis not present

## 2022-06-01 DIAGNOSIS — R251 Tremor, unspecified: Secondary | ICD-10-CM | POA: Diagnosis not present

## 2022-06-01 DIAGNOSIS — Z0000 Encounter for general adult medical examination without abnormal findings: Secondary | ICD-10-CM | POA: Diagnosis not present

## 2022-06-09 ENCOUNTER — Inpatient Hospital Stay: Payer: Medicare Other | Attending: Oncology

## 2022-06-09 ENCOUNTER — Inpatient Hospital Stay (HOSPITAL_BASED_OUTPATIENT_CLINIC_OR_DEPARTMENT_OTHER): Payer: Medicare Other | Admitting: Oncology

## 2022-06-09 VITALS — BP 130/84 | HR 73 | Temp 98.1°F | Resp 20 | Ht 69.0 in | Wt 161.3 lb

## 2022-06-09 DIAGNOSIS — G629 Polyneuropathy, unspecified: Secondary | ICD-10-CM | POA: Insufficient documentation

## 2022-06-09 DIAGNOSIS — I2510 Atherosclerotic heart disease of native coronary artery without angina pectoris: Secondary | ICD-10-CM | POA: Diagnosis not present

## 2022-06-09 DIAGNOSIS — D696 Thrombocytopenia, unspecified: Secondary | ICD-10-CM

## 2022-06-09 DIAGNOSIS — I10 Essential (primary) hypertension: Secondary | ICD-10-CM | POA: Diagnosis not present

## 2022-06-09 LAB — CBC WITH DIFFERENTIAL (CANCER CENTER ONLY)
Abs Immature Granulocytes: 0.02 10*3/uL (ref 0.00–0.07)
Basophils Absolute: 0 10*3/uL (ref 0.0–0.1)
Basophils Relative: 0 %
Eosinophils Absolute: 0.2 10*3/uL (ref 0.0–0.5)
Eosinophils Relative: 3 %
HCT: 38.9 % — ABNORMAL LOW (ref 39.0–52.0)
Hemoglobin: 13 g/dL (ref 13.0–17.0)
Immature Granulocytes: 0 %
Lymphocytes Relative: 38 %
Lymphs Abs: 2 10*3/uL (ref 0.7–4.0)
MCH: 31.9 pg (ref 26.0–34.0)
MCHC: 33.4 g/dL (ref 30.0–36.0)
MCV: 95.3 fL (ref 80.0–100.0)
Monocytes Absolute: 0.5 10*3/uL (ref 0.1–1.0)
Monocytes Relative: 9 %
Neutro Abs: 2.6 10*3/uL (ref 1.7–7.7)
Neutrophils Relative %: 50 %
Platelet Count: 119 10*3/uL — ABNORMAL LOW (ref 150–400)
RBC: 4.08 MIL/uL — ABNORMAL LOW (ref 4.22–5.81)
RDW: 13.1 % (ref 11.5–15.5)
WBC Count: 5.2 10*3/uL (ref 4.0–10.5)
nRBC: 0 % (ref 0.0–0.2)

## 2022-06-09 NOTE — Progress Notes (Signed)
  [facility] OFFICE PROGRESS NOTE   Diagnosis: Thrombocytopenia  INTERVAL HISTORY:   Mr. Vicuna returns as scheduled.  He feels well.  No fever, night sweats, or anorexia.  No bleeding.  He has noted enlargement of a mole at the upper anterior chest.  Objective:  Vital signs in last 24 hours:  Blood pressure 130/84, pulse 73, temperature 98.1 F (36.7 C), resp. rate 20, height '5\' 9"'$  (1.753 m), weight 161 lb 4.8 oz (73.2 kg), SpO2 100 %.    Lymphatics: No cervical, supraclavicular, axillary, or inguinal nodes Resp: Lungs clear bilaterally Cardio: Regular rate and rhythm GI: No hepatosplenomegaly Vascular: No leg edema  Skin: Dark brown 3 cm round raised mobile mole at the upper anterior chest   Lab Results:  Lab Results  Component Value Date   WBC 5.2 06/09/2022   HGB 13.0 06/09/2022   HCT 38.9 (L) 06/09/2022   MCV 95.3 06/09/2022   PLT 119 (L) 06/09/2022   NEUTROABS 2.6 06/09/2022    CMP  Lab Results  Component Value Date   NA 141 09/29/2020   K 4.2 09/29/2020   CL 104 09/29/2020   CO2 27 09/29/2020   GLUCOSE 95 09/29/2020   BUN 14 09/29/2020   CREATININE 1.07 09/29/2020   CALCIUM 9.5 09/29/2020   PROT 6.8 09/30/2014   ALBUMIN 4.6 09/30/2014   AST 30 09/30/2014   ALT 15 09/30/2014   ALKPHOS 69 09/30/2014   BILITOT 0.4 09/30/2014   GFRNONAA >60 09/29/2020   GFRAA 70 10/17/2019    Medications: I have reviewed the patient's current medications.   Assessment/Plan: Thrombocytopenia Left axillary lymph node versus prominent fat pad on exam 10/06/2021, not palpated on exam 02/07/2022, 06/09/2022 Hypertension CAD PAF Neuropathy    Disposition: Mr. Oehlert is stable from a hematologic standpoint.  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  The platelet count is stable today and has not changed significantly over the past few years.  He will call for bleeding. I recommend he follow-up with Dr. Ernie Hew to evaluate the  large benign-appearing mole at the upper anterior chest.  Mr. Leather will return for an office visit and CBC in 8 months.  Betsy Coder, MD  06/09/2022  3:17 PM

## 2022-08-02 DIAGNOSIS — Z23 Encounter for immunization: Secondary | ICD-10-CM | POA: Diagnosis not present

## 2022-08-02 DIAGNOSIS — D696 Thrombocytopenia, unspecified: Secondary | ICD-10-CM | POA: Diagnosis not present

## 2022-08-02 DIAGNOSIS — L989 Disorder of the skin and subcutaneous tissue, unspecified: Secondary | ICD-10-CM | POA: Diagnosis not present

## 2022-08-02 DIAGNOSIS — I10 Essential (primary) hypertension: Secondary | ICD-10-CM | POA: Diagnosis not present

## 2022-08-05 DIAGNOSIS — D485 Neoplasm of uncertain behavior of skin: Secondary | ICD-10-CM | POA: Diagnosis not present

## 2022-08-05 DIAGNOSIS — L821 Other seborrheic keratosis: Secondary | ICD-10-CM | POA: Diagnosis not present

## 2022-09-07 ENCOUNTER — Encounter: Payer: Self-pay | Admitting: *Deleted

## 2022-10-17 DIAGNOSIS — I4891 Unspecified atrial fibrillation: Secondary | ICD-10-CM | POA: Diagnosis not present

## 2022-10-17 DIAGNOSIS — L989 Disorder of the skin and subcutaneous tissue, unspecified: Secondary | ICD-10-CM | POA: Diagnosis not present

## 2022-10-17 DIAGNOSIS — I10 Essential (primary) hypertension: Secondary | ICD-10-CM | POA: Diagnosis not present

## 2022-11-08 DIAGNOSIS — I10 Essential (primary) hypertension: Secondary | ICD-10-CM | POA: Diagnosis not present

## 2022-11-08 DIAGNOSIS — J329 Chronic sinusitis, unspecified: Secondary | ICD-10-CM | POA: Diagnosis not present

## 2022-11-08 DIAGNOSIS — J069 Acute upper respiratory infection, unspecified: Secondary | ICD-10-CM | POA: Diagnosis not present

## 2022-11-08 DIAGNOSIS — I4891 Unspecified atrial fibrillation: Secondary | ICD-10-CM | POA: Diagnosis not present

## 2022-11-10 DIAGNOSIS — J069 Acute upper respiratory infection, unspecified: Secondary | ICD-10-CM | POA: Diagnosis not present

## 2022-11-10 DIAGNOSIS — B9689 Other specified bacterial agents as the cause of diseases classified elsewhere: Secondary | ICD-10-CM | POA: Diagnosis not present

## 2022-11-10 DIAGNOSIS — J329 Chronic sinusitis, unspecified: Secondary | ICD-10-CM | POA: Diagnosis not present

## 2022-11-10 DIAGNOSIS — H6121 Impacted cerumen, right ear: Secondary | ICD-10-CM | POA: Diagnosis not present

## 2022-11-15 DIAGNOSIS — H02403 Unspecified ptosis of bilateral eyelids: Secondary | ICD-10-CM | POA: Diagnosis not present

## 2022-11-15 DIAGNOSIS — H02889 Meibomian gland dysfunction of unspecified eye, unspecified eyelid: Secondary | ICD-10-CM | POA: Diagnosis not present

## 2022-11-15 DIAGNOSIS — H02051 Trichiasis without entropian right upper eyelid: Secondary | ICD-10-CM | POA: Diagnosis not present

## 2022-11-15 DIAGNOSIS — H43813 Vitreous degeneration, bilateral: Secondary | ICD-10-CM | POA: Diagnosis not present

## 2023-01-26 DIAGNOSIS — Z950 Presence of cardiac pacemaker: Secondary | ICD-10-CM | POA: Diagnosis not present

## 2023-01-26 DIAGNOSIS — R2242 Localized swelling, mass and lump, left lower limb: Secondary | ICD-10-CM | POA: Diagnosis not present

## 2023-01-26 DIAGNOSIS — M1612 Unilateral primary osteoarthritis, left hip: Secondary | ICD-10-CM | POA: Diagnosis not present

## 2023-01-26 DIAGNOSIS — M25552 Pain in left hip: Secondary | ICD-10-CM | POA: Diagnosis not present

## 2023-01-26 DIAGNOSIS — Z7901 Long term (current) use of anticoagulants: Secondary | ICD-10-CM | POA: Diagnosis not present

## 2023-01-26 DIAGNOSIS — M79652 Pain in left thigh: Secondary | ICD-10-CM | POA: Diagnosis not present

## 2023-02-09 ENCOUNTER — Inpatient Hospital Stay: Payer: Medicare Other | Attending: Oncology

## 2023-02-09 ENCOUNTER — Inpatient Hospital Stay (HOSPITAL_BASED_OUTPATIENT_CLINIC_OR_DEPARTMENT_OTHER): Payer: Medicare Other | Admitting: Oncology

## 2023-02-09 VITALS — BP 126/77 | HR 69 | Temp 98.2°F | Resp 18 | Ht 69.0 in | Wt 170.6 lb

## 2023-02-09 DIAGNOSIS — I480 Paroxysmal atrial fibrillation: Secondary | ICD-10-CM | POA: Insufficient documentation

## 2023-02-09 DIAGNOSIS — D696 Thrombocytopenia, unspecified: Secondary | ICD-10-CM | POA: Diagnosis not present

## 2023-02-09 DIAGNOSIS — G629 Polyneuropathy, unspecified: Secondary | ICD-10-CM | POA: Insufficient documentation

## 2023-02-09 DIAGNOSIS — I2510 Atherosclerotic heart disease of native coronary artery without angina pectoris: Secondary | ICD-10-CM | POA: Insufficient documentation

## 2023-02-09 DIAGNOSIS — I10 Essential (primary) hypertension: Secondary | ICD-10-CM | POA: Diagnosis not present

## 2023-02-09 LAB — CBC WITH DIFFERENTIAL (CANCER CENTER ONLY)
Abs Immature Granulocytes: 0.02 10*3/uL (ref 0.00–0.07)
Basophils Absolute: 0 10*3/uL (ref 0.0–0.1)
Basophils Relative: 0 %
Eosinophils Absolute: 0.1 10*3/uL (ref 0.0–0.5)
Eosinophils Relative: 2 %
HCT: 38.8 % — ABNORMAL LOW (ref 39.0–52.0)
Hemoglobin: 12.8 g/dL — ABNORMAL LOW (ref 13.0–17.0)
Immature Granulocytes: 0 %
Lymphocytes Relative: 41 %
Lymphs Abs: 2.1 10*3/uL (ref 0.7–4.0)
MCH: 31.2 pg (ref 26.0–34.0)
MCHC: 33 g/dL (ref 30.0–36.0)
MCV: 94.6 fL (ref 80.0–100.0)
Monocytes Absolute: 0.4 10*3/uL (ref 0.1–1.0)
Monocytes Relative: 8 %
Neutro Abs: 2.4 10*3/uL (ref 1.7–7.7)
Neutrophils Relative %: 49 %
Platelet Count: 112 10*3/uL — ABNORMAL LOW (ref 150–400)
RBC: 4.1 MIL/uL — ABNORMAL LOW (ref 4.22–5.81)
RDW: 13.9 % (ref 11.5–15.5)
WBC Count: 5 10*3/uL (ref 4.0–10.5)
nRBC: 0 % (ref 0.0–0.2)

## 2023-02-09 NOTE — Progress Notes (Signed)
   [facility]ESS [facility]   Diagnosis: Thrombocytopenia  INTERVAL HISTORY:   David. Sellars returns as scheduled.  He feels well.  Good appetite.  He had a mild nosebleed lasting 4-5 minutes a few weeks ago.  No other bleeding.  No fever or night sweats.  Objective:  Vital signs in last 24 hours:  Blood pressure 126/77, pulse 69, temperature 98.2 F (36.8 C), temperature source Oral, resp. rate 18, height 5\' 9"  (1.753 m), weight 170 lb 9.6 oz (77.4 kg), SpO2 98 %.     Lymphatics: No cervical, supraclavicular, axillary, or inguinal nodes Resp: Lungs clear bilaterally Cardio: Regular rate and rhythm GI: No hepatosplenomegaly Vascular: No leg edema  Lab Results:  Lab Results  Component Value Date   WBC 5.0 02/09/2023   HGB 12.8 (L) 02/09/2023   HCT 38.8 (L) 02/09/2023   MCV 94.6 02/09/2023   PLT 112 (L) 02/09/2023   NEUTROABS 2.4 02/09/2023    CMP  Lab Results  Component Value Date   NA 141 09/29/2020   K 4.2 09/29/2020   CL 104 09/29/2020   CO2 27 09/29/2020   GLUCOSE 95 09/29/2020   BUN 14 09/29/2020   CREATININE 1.07 09/29/2020   CALCIUM 9.5 09/29/2020   PROT 6.8 09/30/2014   ALBUMIN 4.6 09/30/2014   AST 30 09/30/2014   ALT 15 09/30/2014   ALKPHOS 69 09/30/2014   BILITOT 0.4 09/30/2014   GFRNONAA >60 09/29/2020   GFRAA 70 10/17/2019     Medications: I have reviewed the patient's current medications.   Assessment/Plan: Thrombocytopenia Left axillary lymph node versus prominent fat pad on exam 10/06/2021, not palpated on exam 02/07/2022, 06/09/2022, 02/09/2023 Hypertension CAD PAF Neuropathy    Disposition: David Booth appears stable.  He has chronic mild thrombocytopenia.  The hemoglobin is at the low end of the normal range.  The recent nosebleed may be related to apixaban therapy.  He will call for increased bleeding or new symptoms.  David. Tam will return for an office visit and CBC in 8 months.  Thornton Papas, MD  02/09/2023  3:29  PM

## 2023-02-14 DIAGNOSIS — E559 Vitamin D deficiency, unspecified: Secondary | ICD-10-CM | POA: Diagnosis not present

## 2023-02-14 DIAGNOSIS — E782 Mixed hyperlipidemia: Secondary | ICD-10-CM | POA: Diagnosis not present

## 2023-02-14 DIAGNOSIS — I10 Essential (primary) hypertension: Secondary | ICD-10-CM | POA: Diagnosis not present

## 2023-02-14 DIAGNOSIS — E039 Hypothyroidism, unspecified: Secondary | ICD-10-CM | POA: Diagnosis not present

## 2023-02-16 DIAGNOSIS — I10 Essential (primary) hypertension: Secondary | ICD-10-CM | POA: Diagnosis not present

## 2023-02-16 DIAGNOSIS — Z23 Encounter for immunization: Secondary | ICD-10-CM | POA: Diagnosis not present

## 2023-02-16 DIAGNOSIS — D696 Thrombocytopenia, unspecified: Secondary | ICD-10-CM | POA: Diagnosis not present

## 2023-02-16 DIAGNOSIS — E782 Mixed hyperlipidemia: Secondary | ICD-10-CM | POA: Diagnosis not present

## 2023-02-16 DIAGNOSIS — Z79899 Other long term (current) drug therapy: Secondary | ICD-10-CM | POA: Diagnosis not present

## 2023-02-16 DIAGNOSIS — E559 Vitamin D deficiency, unspecified: Secondary | ICD-10-CM | POA: Diagnosis not present

## 2023-02-16 DIAGNOSIS — M19012 Primary osteoarthritis, left shoulder: Secondary | ICD-10-CM | POA: Diagnosis not present

## 2023-02-16 DIAGNOSIS — J309 Allergic rhinitis, unspecified: Secondary | ICD-10-CM | POA: Diagnosis not present

## 2023-06-06 ENCOUNTER — Other Ambulatory Visit: Payer: Self-pay | Admitting: Family Medicine

## 2023-06-06 DIAGNOSIS — Z1339 Encounter for screening examination for other mental health and behavioral disorders: Secondary | ICD-10-CM | POA: Diagnosis not present

## 2023-06-06 DIAGNOSIS — H9190 Unspecified hearing loss, unspecified ear: Secondary | ICD-10-CM | POA: Diagnosis not present

## 2023-06-06 DIAGNOSIS — H6123 Impacted cerumen, bilateral: Secondary | ICD-10-CM | POA: Diagnosis not present

## 2023-06-06 DIAGNOSIS — J309 Allergic rhinitis, unspecified: Secondary | ICD-10-CM | POA: Diagnosis not present

## 2023-06-06 DIAGNOSIS — Z0000 Encounter for general adult medical examination without abnormal findings: Secondary | ICD-10-CM | POA: Diagnosis not present

## 2023-06-06 DIAGNOSIS — Z1331 Encounter for screening for depression: Secondary | ICD-10-CM | POA: Diagnosis not present

## 2023-06-06 DIAGNOSIS — R042 Hemoptysis: Secondary | ICD-10-CM

## 2023-06-13 DIAGNOSIS — H43813 Vitreous degeneration, bilateral: Secondary | ICD-10-CM | POA: Diagnosis not present

## 2023-06-13 DIAGNOSIS — H02051 Trichiasis without entropian right upper eyelid: Secondary | ICD-10-CM | POA: Diagnosis not present

## 2023-06-13 DIAGNOSIS — H02403 Unspecified ptosis of bilateral eyelids: Secondary | ICD-10-CM | POA: Diagnosis not present

## 2023-06-13 DIAGNOSIS — H02889 Meibomian gland dysfunction of unspecified eye, unspecified eyelid: Secondary | ICD-10-CM | POA: Diagnosis not present

## 2023-06-14 DIAGNOSIS — R042 Hemoptysis: Secondary | ICD-10-CM | POA: Diagnosis not present

## 2023-06-14 DIAGNOSIS — I10 Essential (primary) hypertension: Secondary | ICD-10-CM | POA: Diagnosis not present

## 2023-06-14 DIAGNOSIS — Z23 Encounter for immunization: Secondary | ICD-10-CM | POA: Diagnosis not present

## 2023-06-14 DIAGNOSIS — J309 Allergic rhinitis, unspecified: Secondary | ICD-10-CM | POA: Diagnosis not present

## 2023-06-27 ENCOUNTER — Ambulatory Visit: Payer: Medicare Other | Admitting: Audiology

## 2023-06-27 DIAGNOSIS — H903 Sensorineural hearing loss, bilateral: Secondary | ICD-10-CM | POA: Insufficient documentation

## 2023-06-27 NOTE — Procedures (Signed)
Outpatient Audiology and [facility] [address] [place], [place]  [address] [phone]  AUDIOLOGICAL  EVALUATION  NAME: David Booth     DOB:   1933-07-26      MRN: [id number]                                                                                     DATE: 06/27/2023     REFERENT: Lewis Moccasin, MD STATUS: Outpatient DIAGNOSIS: Sensorineural hearing loss  History: [name] was seen for an audiological evaluation due to decreased hearing occurring for 1 to 2 years.  He denies otalgia, tinnitus, aural fullness, and dizziness or vertigo. [name] reports increased difficulty hearing and communicating in the presence of background noise.  He also reports increased difficulty communicating and hearing on the phone when he is driving in the car with all the road noise. [name] denies a history of noise exposure.   Evaluation:  Otoscopy showed a clear view of the tympanic membranes, bilaterally Tympanometry results were consistent with normal middle ear pressure and normal tympanic membrane mobility (Type A), bilaterally. Audiometric testing was completed using Conventional Audiometry techniques with insert earphones and TDH headphones. Test results are consistent in the right ear with normal hearing sensitivity sloping to a mild to moderately severe sensorineural hearing loss.  Test results are consistent in the left ear with normal hearing sensitivity sloping to a mild to moderately severe sensorineural hearing loss with a mixed component noted at 4000 Hz.  An asymmetry is noted at 3000 to 6000 Hz, worse in the left ear.  Speech Recognition Thresholds were obtained at 20 dB HL in the right ear and at 25  dB HL in the left ear. Word Recognition Testing was completed at 100% in the right ear dB HL and [name] scored 92% in the left ear.      Results:  The test results were reviewed with [name]. Today's results are consistent in the right ear with normal hearing  sensitivity sloping to a mild to moderately severe sensorineural hearing loss.  Test results are consistent in the left ear with normal hearing sensitivity sloping to a mild to moderately severe sensorineural hearing loss with a mixed component noted at 4000 Hz.  An asymmetry is noted at 3000 to 6000 Hz, worse in the left ear.  [name] will have hearing and communication difficulty in adverse listening environments.  He will benefit from the use of good communication strategies and the use of hearing aids or amplification if motivated.  A referral to an ENT was reviewed due to the asymmetry in his hearing loss. [name] was counseled regarding hearing aids and the use of hearing aids and he reported he is currently not interested in pursuing amplification.  Recommendations: 1.   A referral to an ENT is recommended due to an asymmetry in his hearing. 2.   A communication needs assessment with an audiologist to further discuss hearing aids as recommended if [name] is interested in pursuing amplification .3.   Return in 2 to 3 years for an audiological evaluation to continue to monitor hearing sensitivity.   35 minutes spent testing and counseling on results.  If you have any questions please feel free to contact me at [phone].  Marton Redwood Audiologist, Au.D., CCC-A 06/27/2023  3:12 PM  Cc: Lewis Moccasin, MD

## 2023-06-29 ENCOUNTER — Other Ambulatory Visit: Payer: Medicare Other

## 2023-08-04 ENCOUNTER — Telehealth: Payer: Self-pay | Admitting: Audiologist

## 2023-08-04 NOTE — Telephone Encounter (Signed)
Dr Duanne Guess requesting records of exam

## 2023-08-17 DIAGNOSIS — R5383 Other fatigue: Secondary | ICD-10-CM | POA: Diagnosis not present

## 2023-08-17 DIAGNOSIS — E785 Hyperlipidemia, unspecified: Secondary | ICD-10-CM | POA: Diagnosis not present

## 2023-08-17 DIAGNOSIS — I10 Essential (primary) hypertension: Secondary | ICD-10-CM | POA: Diagnosis not present

## 2023-08-17 DIAGNOSIS — N3941 Urge incontinence: Secondary | ICD-10-CM | POA: Diagnosis not present

## 2023-08-17 DIAGNOSIS — D696 Thrombocytopenia, unspecified: Secondary | ICD-10-CM | POA: Diagnosis not present

## 2023-08-17 DIAGNOSIS — Z125 Encounter for screening for malignant neoplasm of prostate: Secondary | ICD-10-CM | POA: Diagnosis not present

## 2023-08-28 DIAGNOSIS — Z23 Encounter for immunization: Secondary | ICD-10-CM | POA: Diagnosis not present

## 2023-08-28 DIAGNOSIS — R000 Tachycardia, unspecified: Secondary | ICD-10-CM | POA: Diagnosis not present

## 2023-08-28 DIAGNOSIS — I10 Essential (primary) hypertension: Secondary | ICD-10-CM | POA: Diagnosis not present

## 2023-08-28 DIAGNOSIS — E782 Mixed hyperlipidemia: Secondary | ICD-10-CM | POA: Diagnosis not present

## 2023-08-28 DIAGNOSIS — R972 Elevated prostate specific antigen [PSA]: Secondary | ICD-10-CM | POA: Diagnosis not present

## 2023-08-28 DIAGNOSIS — N3941 Urge incontinence: Secondary | ICD-10-CM | POA: Diagnosis not present

## 2023-08-28 DIAGNOSIS — D696 Thrombocytopenia, unspecified: Secondary | ICD-10-CM | POA: Diagnosis not present

## 2023-08-28 DIAGNOSIS — R351 Nocturia: Secondary | ICD-10-CM | POA: Diagnosis not present

## 2023-08-28 DIAGNOSIS — Z789 Other specified health status: Secondary | ICD-10-CM | POA: Diagnosis not present

## 2023-09-04 DIAGNOSIS — I10 Essential (primary) hypertension: Secondary | ICD-10-CM | POA: Diagnosis not present

## 2023-09-04 DIAGNOSIS — Z79899 Other long term (current) drug therapy: Secondary | ICD-10-CM | POA: Diagnosis not present

## 2023-09-04 DIAGNOSIS — I4891 Unspecified atrial fibrillation: Secondary | ICD-10-CM | POA: Diagnosis not present

## 2023-09-04 DIAGNOSIS — R972 Elevated prostate specific antigen [PSA]: Secondary | ICD-10-CM | POA: Diagnosis not present

## 2023-09-04 DIAGNOSIS — H6123 Impacted cerumen, bilateral: Secondary | ICD-10-CM | POA: Diagnosis not present

## 2023-09-04 DIAGNOSIS — N3941 Urge incontinence: Secondary | ICD-10-CM | POA: Diagnosis not present

## 2023-09-04 DIAGNOSIS — R3912 Poor urinary stream: Secondary | ICD-10-CM | POA: Diagnosis not present

## 2023-09-04 DIAGNOSIS — N403 Nodular prostate with lower urinary tract symptoms: Secondary | ICD-10-CM | POA: Diagnosis not present

## 2023-09-22 DIAGNOSIS — R972 Elevated prostate specific antigen [PSA]: Secondary | ICD-10-CM | POA: Diagnosis not present

## 2023-10-05 DIAGNOSIS — R972 Elevated prostate specific antigen [PSA]: Secondary | ICD-10-CM | POA: Diagnosis not present

## 2023-10-05 DIAGNOSIS — N3941 Urge incontinence: Secondary | ICD-10-CM | POA: Diagnosis not present

## 2023-10-10 ENCOUNTER — Inpatient Hospital Stay: Payer: Medicare Other

## 2023-10-10 ENCOUNTER — Inpatient Hospital Stay: Payer: Medicare Other | Attending: Oncology | Admitting: Oncology

## 2023-10-10 ENCOUNTER — Encounter: Payer: Self-pay | Admitting: Oncology

## 2023-10-10 VITALS — BP 132/79 | HR 82 | Temp 97.9°F | Resp 18 | Ht 69.0 in | Wt 170.7 lb

## 2023-10-10 DIAGNOSIS — D696 Thrombocytopenia, unspecified: Secondary | ICD-10-CM

## 2023-10-10 DIAGNOSIS — I2510 Atherosclerotic heart disease of native coronary artery without angina pectoris: Secondary | ICD-10-CM | POA: Diagnosis not present

## 2023-10-10 DIAGNOSIS — I480 Paroxysmal atrial fibrillation: Secondary | ICD-10-CM | POA: Diagnosis not present

## 2023-10-10 DIAGNOSIS — I10 Essential (primary) hypertension: Secondary | ICD-10-CM | POA: Diagnosis not present

## 2023-10-10 DIAGNOSIS — G629 Polyneuropathy, unspecified: Secondary | ICD-10-CM | POA: Insufficient documentation

## 2023-10-10 LAB — CBC WITH DIFFERENTIAL (CANCER CENTER ONLY)
Abs Immature Granulocytes: 0.03 10*3/uL (ref 0.00–0.07)
Basophils Absolute: 0 10*3/uL (ref 0.0–0.1)
Basophils Relative: 0 %
Eosinophils Absolute: 0.1 10*3/uL (ref 0.0–0.5)
Eosinophils Relative: 2 %
HCT: 40 % (ref 39.0–52.0)
Hemoglobin: 13.2 g/dL (ref 13.0–17.0)
Immature Granulocytes: 1 %
Lymphocytes Relative: 35 %
Lymphs Abs: 2 10*3/uL (ref 0.7–4.0)
MCH: 31.3 pg (ref 26.0–34.0)
MCHC: 33 g/dL (ref 30.0–36.0)
MCV: 94.8 fL (ref 80.0–100.0)
Monocytes Absolute: 0.5 10*3/uL (ref 0.1–1.0)
Monocytes Relative: 9 %
Neutro Abs: 3.1 10*3/uL (ref 1.7–7.7)
Neutrophils Relative %: 53 %
Platelet Count: 120 10*3/uL — ABNORMAL LOW (ref 150–400)
RBC: 4.22 MIL/uL (ref 4.22–5.81)
RDW: 13.2 % (ref 11.5–15.5)
WBC Count: 5.8 10*3/uL (ref 4.0–10.5)
nRBC: 0 % (ref 0.0–0.2)

## 2023-10-10 NOTE — Progress Notes (Signed)
  [facility] OFFICE PROGRESS NOTE   Diagnosis: Thrombocytopenia  INTERVAL HISTORY:   David Booth returns as scheduled.  He feels well.  Good appetite.  He is seeing urology for urinary incontinence.  No bleeding.  No recent infection.  Objective:  Vital signs in last 24 hours:  Blood pressure 132/79, pulse 82, temperature 97.9 F (36.6 C), temperature source Temporal, resp. rate 18, height 5\' 9"  (1.753 m), weight 170 lb 11.2 oz (77.4 kg), SpO2 100%.     Lymphatics: No cervical, supraclavicular, axillary, or inguinal nodes Resp: Lungs clear bilaterally Cardio: Regular rate and rhythm GI: No hepatosplenomegaly Vascular: No leg edema   Lab Results:  Lab Results  Component Value Date   WBC 5.8 10/10/2023   HGB 13.2 10/10/2023   HCT 40.0 10/10/2023   MCV 94.8 10/10/2023   PLT 120 (L) 10/10/2023   NEUTROABS 3.1 10/10/2023    CMP  Lab Results  Component Value Date   NA 141 09/29/2020   K 4.2 09/29/2020   CL 104 09/29/2020   CO2 27 09/29/2020   GLUCOSE 95 09/29/2020   BUN 14 09/29/2020   CREATININE 1.07 09/29/2020   CALCIUM 9.5 09/29/2020   PROT 6.8 09/30/2014   ALBUMIN 4.6 09/30/2014   AST 30 09/30/2014   ALT 15 09/30/2014   ALKPHOS 69 09/30/2014   BILITOT 0.4 09/30/2014   GFRNONAA >60 09/29/2020   GFRAA 70 10/17/2019     Medications: I have reviewed the patient's current medications.   Assessment/Plan: Thrombocytopenia Left axillary lymph node versus prominent fat pad on exam 10/06/2021, not palpated on exam 02/07/2022, 06/09/2022, 02/09/2023, 10/10/2023 Hypertension CAD PAF Neuropathy    Disposition: David Booth has chronic mild thrombocytopenia.  The thrombocytopenia may be related to a benign normal variant, chronic ITP, or early myelodysplasia.  The platelet count has not changed significantly over the past several years.  He will seek medical attention for spontaneous bleeding or bruising.  He plans to continue clinical follow-up with Dr.  Duanne Guess.  He is not scheduled for follow-up appointment in the hematology clinic.  I am available to see him if he develops progressive thrombocytopenia or a new hematologic abnormality.  Thornton Papas, MD  10/10/2023  2:54 PM

## 2023-10-17 ENCOUNTER — Ambulatory Visit: Payer: Medicare Other

## 2023-10-17 DIAGNOSIS — I495 Sick sinus syndrome: Secondary | ICD-10-CM

## 2023-10-20 LAB — CUP PACEART REMOTE DEVICE CHECK
Battery Remaining Longevity: 107 mo
Battery Voltage: 3 V
Brady Statistic AP VP Percent: 14.45 %
Brady Statistic AP VS Percent: 85.06 %
Brady Statistic AS VP Percent: 0.01 %
Brady Statistic AS VS Percent: 0.47 %
Brady Statistic RA Percent Paced: 99.66 %
Brady Statistic RV Percent Paced: 14.47 %
Date Time Interrogation Session: 20241220112350
Implantable Lead Connection Status: 753985
Implantable Lead Connection Status: 753985
Implantable Lead Implant Date: 20110429
Implantable Lead Implant Date: 20201221
Implantable Lead Location: 753859
Implantable Lead Location: 753860
Implantable Lead Model: 5076
Implantable Pulse Generator Implant Date: 20201221
Lead Channel Impedance Value: 304 Ohm
Lead Channel Impedance Value: 399 Ohm
Lead Channel Impedance Value: 418 Ohm
Lead Channel Impedance Value: 589 Ohm
Lead Channel Pacing Threshold Amplitude: 0.625 V
Lead Channel Pacing Threshold Amplitude: 0.625 V
Lead Channel Pacing Threshold Pulse Width: 0.4 ms
Lead Channel Pacing Threshold Pulse Width: 0.4 ms
Lead Channel Sensing Intrinsic Amplitude: 1.625 mV
Lead Channel Sensing Intrinsic Amplitude: 1.625 mV
Lead Channel Sensing Intrinsic Amplitude: 7.5 mV
Lead Channel Sensing Intrinsic Amplitude: 7.5 mV
Lead Channel Setting Pacing Amplitude: 1.5 V
Lead Channel Setting Pacing Amplitude: 2.5 V
Lead Channel Setting Pacing Pulse Width: 0.4 ms
Lead Channel Setting Sensing Sensitivity: 1.2 mV
Zone Setting Status: 755011
Zone Setting Status: 755011

## 2023-11-06 DIAGNOSIS — N403 Nodular prostate with lower urinary tract symptoms: Secondary | ICD-10-CM | POA: Diagnosis not present

## 2023-11-06 DIAGNOSIS — R972 Elevated prostate specific antigen [PSA]: Secondary | ICD-10-CM | POA: Diagnosis not present

## 2023-11-06 DIAGNOSIS — N3941 Urge incontinence: Secondary | ICD-10-CM | POA: Diagnosis not present

## 2023-11-09 DIAGNOSIS — I10 Essential (primary) hypertension: Secondary | ICD-10-CM | POA: Diagnosis not present

## 2023-11-09 DIAGNOSIS — R251 Tremor, unspecified: Secondary | ICD-10-CM | POA: Diagnosis not present

## 2023-11-09 DIAGNOSIS — J309 Allergic rhinitis, unspecified: Secondary | ICD-10-CM | POA: Diagnosis not present

## 2023-11-23 NOTE — Addendum Note (Signed)
Addended by: Geralyn Flash D on: 11/23/2023 04:58 PM   Modules accepted: Orders

## 2023-11-23 NOTE — Progress Notes (Signed)
Remote pacemaker transmission.   

## 2023-12-13 DIAGNOSIS — H02403 Unspecified ptosis of bilateral eyelids: Secondary | ICD-10-CM | POA: Diagnosis not present

## 2023-12-13 DIAGNOSIS — H02052 Trichiasis without entropian right lower eyelid: Secondary | ICD-10-CM | POA: Diagnosis not present

## 2023-12-13 DIAGNOSIS — H43813 Vitreous degeneration, bilateral: Secondary | ICD-10-CM | POA: Diagnosis not present

## 2023-12-13 DIAGNOSIS — H02889 Meibomian gland dysfunction of unspecified eye, unspecified eyelid: Secondary | ICD-10-CM | POA: Diagnosis not present

## 2023-12-13 DIAGNOSIS — H02051 Trichiasis without entropian right upper eyelid: Secondary | ICD-10-CM | POA: Diagnosis not present

## 2023-12-25 DIAGNOSIS — R972 Elevated prostate specific antigen [PSA]: Secondary | ICD-10-CM | POA: Diagnosis not present

## 2024-01-01 DIAGNOSIS — R972 Elevated prostate specific antigen [PSA]: Secondary | ICD-10-CM | POA: Diagnosis not present

## 2024-01-01 DIAGNOSIS — N403 Nodular prostate with lower urinary tract symptoms: Secondary | ICD-10-CM | POA: Diagnosis not present

## 2024-01-01 DIAGNOSIS — N3941 Urge incontinence: Secondary | ICD-10-CM | POA: Diagnosis not present

## 2024-01-03 ENCOUNTER — Other Ambulatory Visit (HOSPITAL_COMMUNITY): Payer: Self-pay | Admitting: Urology

## 2024-01-03 DIAGNOSIS — R972 Elevated prostate specific antigen [PSA]: Secondary | ICD-10-CM

## 2024-01-03 DIAGNOSIS — N403 Nodular prostate with lower urinary tract symptoms: Secondary | ICD-10-CM

## 2024-01-11 ENCOUNTER — Encounter (HOSPITAL_COMMUNITY)
Admission: RE | Admit: 2024-01-11 | Discharge: 2024-01-11 | Disposition: A | Discharge status: Home / Self Care | Source: Ambulatory Visit | Attending: Urology | Admitting: Urology

## 2024-01-11 DIAGNOSIS — N403 Nodular prostate with lower urinary tract symptoms: Secondary | ICD-10-CM | POA: Diagnosis not present

## 2024-01-11 DIAGNOSIS — C61 Malignant neoplasm of prostate: Secondary | ICD-10-CM | POA: Diagnosis not present

## 2024-01-11 DIAGNOSIS — R972 Elevated prostate specific antigen [PSA]: Secondary | ICD-10-CM | POA: Insufficient documentation

## 2024-01-11 MED ORDER — FLOTUFOLASTAT F 18 GALLIUM 296-5846 MBQ/ML IV SOLN
8.0000 | Freq: Once | INTRAVENOUS | Status: AC
  Administered 2024-01-11: 8.6 via INTRAVENOUS
  Filled 2024-01-11: qty 8

## 2024-01-16 ENCOUNTER — Ambulatory Visit (INDEPENDENT_AMBULATORY_CARE_PROVIDER_SITE_OTHER): Payer: Medicare Other

## 2024-01-16 DIAGNOSIS — I495 Sick sinus syndrome: Secondary | ICD-10-CM | POA: Diagnosis not present

## 2024-01-17 LAB — CUP PACEART REMOTE DEVICE CHECK
Battery Remaining Longevity: 105 mo
Battery Voltage: 2.99 V
Brady Statistic AP VP Percent: 1.55 %
Brady Statistic AP VS Percent: 98 %
Brady Statistic AS VP Percent: 0 %
Brady Statistic AS VS Percent: 0.45 %
Brady Statistic RA Percent Paced: 99.69 %
Brady Statistic RV Percent Paced: 1.59 %
Date Time Interrogation Session: 20250317210946
Implantable Lead Connection Status: 753985
Implantable Lead Connection Status: 753985
Implantable Lead Implant Date: 20110429
Implantable Lead Implant Date: 20201221
Implantable Lead Location: 753859
Implantable Lead Location: 753860
Implantable Lead Model: 5076
Implantable Pulse Generator Implant Date: 20201221
Lead Channel Impedance Value: 323 Ohm
Lead Channel Impedance Value: 418 Ohm
Lead Channel Impedance Value: 418 Ohm
Lead Channel Impedance Value: 646 Ohm
Lead Channel Pacing Threshold Amplitude: 0.625 V
Lead Channel Pacing Threshold Amplitude: 0.625 V
Lead Channel Pacing Threshold Pulse Width: 0.4 ms
Lead Channel Pacing Threshold Pulse Width: 0.4 ms
Lead Channel Sensing Intrinsic Amplitude: 2 mV
Lead Channel Sensing Intrinsic Amplitude: 2 mV
Lead Channel Sensing Intrinsic Amplitude: 7.5 mV
Lead Channel Sensing Intrinsic Amplitude: 7.5 mV
Lead Channel Setting Pacing Amplitude: 1.5 V
Lead Channel Setting Pacing Amplitude: 2.5 V
Lead Channel Setting Pacing Pulse Width: 0.4 ms
Lead Channel Setting Sensing Sensitivity: 1.2 mV
Zone Setting Status: 755011
Zone Setting Status: 755011

## 2024-01-22 ENCOUNTER — Encounter: Payer: Self-pay | Admitting: Cardiovascular Disease

## 2024-02-06 ENCOUNTER — Encounter: Payer: Self-pay | Admitting: Cardiovascular Disease

## 2024-02-07 ENCOUNTER — Other Ambulatory Visit (HOSPITAL_COMMUNITY): Payer: Self-pay

## 2024-02-07 ENCOUNTER — Telehealth: Payer: Self-pay | Admitting: Pharmacy Technician

## 2024-02-07 NOTE — Telephone Encounter (Signed)
 PAP: Patient assistance application for Eliquis through [employer] ([employer]) has been mailed to pt's home address on file. Provider portion of application will be faxed to provider's office.

## 2024-02-13 DIAGNOSIS — E559 Vitamin D deficiency, unspecified: Secondary | ICD-10-CM | POA: Diagnosis not present

## 2024-02-13 DIAGNOSIS — R5383 Other fatigue: Secondary | ICD-10-CM | POA: Diagnosis not present

## 2024-02-13 DIAGNOSIS — E785 Hyperlipidemia, unspecified: Secondary | ICD-10-CM | POA: Diagnosis not present

## 2024-02-14 DIAGNOSIS — N403 Nodular prostate with lower urinary tract symptoms: Secondary | ICD-10-CM | POA: Diagnosis not present

## 2024-02-14 DIAGNOSIS — R3912 Poor urinary stream: Secondary | ICD-10-CM | POA: Diagnosis not present

## 2024-02-14 DIAGNOSIS — C61 Malignant neoplasm of prostate: Secondary | ICD-10-CM | POA: Diagnosis not present

## 2024-02-20 DIAGNOSIS — I10 Essential (primary) hypertension: Secondary | ICD-10-CM | POA: Diagnosis not present

## 2024-02-20 DIAGNOSIS — R251 Tremor, unspecified: Secondary | ICD-10-CM | POA: Diagnosis not present

## 2024-02-20 DIAGNOSIS — E782 Mixed hyperlipidemia: Secondary | ICD-10-CM | POA: Diagnosis not present

## 2024-02-20 DIAGNOSIS — R972 Elevated prostate specific antigen [PSA]: Secondary | ICD-10-CM | POA: Diagnosis not present

## 2024-02-20 DIAGNOSIS — E559 Vitamin D deficiency, unspecified: Secondary | ICD-10-CM | POA: Diagnosis not present

## 2024-02-20 DIAGNOSIS — C61 Malignant neoplasm of prostate: Secondary | ICD-10-CM | POA: Diagnosis not present

## 2024-02-20 DIAGNOSIS — R000 Tachycardia, unspecified: Secondary | ICD-10-CM | POA: Diagnosis not present

## 2024-02-20 DIAGNOSIS — Z789 Other specified health status: Secondary | ICD-10-CM | POA: Diagnosis not present

## 2024-02-26 NOTE — Telephone Encounter (Signed)
 PAP: Application for Eliquis  has been submitted to [employer] ([employer]), via fax  - patient portion. Provider portion uploaded into media

## 2024-02-26 NOTE — Telephone Encounter (Signed)
Eliquis 5 mg twice daily

## 2024-02-28 NOTE — Telephone Encounter (Signed)
 Faxed provider portion of patient assistance

## 2024-02-29 NOTE — Telephone Encounter (Signed)
 I think this has been taken care of already per Katherine's note on 02/28/24. Should be good to go. Status still pending.

## 2024-03-01 ENCOUNTER — Encounter: Payer: Self-pay | Admitting: Pharmacy Technician

## 2024-03-01 NOTE — Telephone Encounter (Signed)
 Sent the patient a [employer].

## 2024-03-03 NOTE — Addendum Note (Signed)
 Addended by: Lott Rouleau A on: 03/03/2024 05:07 AM   Modules accepted: Orders

## 2024-03-03 NOTE — Progress Notes (Signed)
 Remote pacemaker transmission.

## 2024-03-04 NOTE — Telephone Encounter (Signed)
 Hello,   I spent another $116.15 foEliquis  on 03/01/24. Copy of  receipt attached. Will that qualify me for help.   Thank You: David Booth     03/04/24-Faxed [employer] his out of pocket receipt the he sent in mychart to me

## 2024-03-07 NOTE — Telephone Encounter (Signed)
   I called [employer] and they said he was denied after even sending in more oop. I left him a message to call me back-to let him know about the medicare payment plan

## 2024-03-12 DIAGNOSIS — J309 Allergic rhinitis, unspecified: Secondary | ICD-10-CM | POA: Diagnosis not present

## 2024-03-12 DIAGNOSIS — I10 Essential (primary) hypertension: Secondary | ICD-10-CM | POA: Diagnosis not present

## 2024-03-12 DIAGNOSIS — I4891 Unspecified atrial fibrillation: Secondary | ICD-10-CM | POA: Diagnosis not present

## 2024-03-12 DIAGNOSIS — R251 Tremor, unspecified: Secondary | ICD-10-CM | POA: Diagnosis not present

## 2024-03-15 DIAGNOSIS — R972 Elevated prostate specific antigen [PSA]: Secondary | ICD-10-CM | POA: Diagnosis not present

## 2024-04-07 ENCOUNTER — Encounter: Payer: Self-pay | Admitting: Cardiovascular Disease

## 2024-04-16 ENCOUNTER — Ambulatory Visit (INDEPENDENT_AMBULATORY_CARE_PROVIDER_SITE_OTHER): Payer: Medicare Other

## 2024-04-16 DIAGNOSIS — I495 Sick sinus syndrome: Secondary | ICD-10-CM | POA: Diagnosis not present

## 2024-04-22 ENCOUNTER — Ambulatory Visit: Payer: Self-pay | Admitting: Cardiovascular Disease

## 2024-04-22 DIAGNOSIS — E785 Hyperlipidemia, unspecified: Secondary | ICD-10-CM | POA: Diagnosis not present

## 2024-04-22 LAB — CUP PACEART REMOTE DEVICE CHECK
Battery Remaining Longevity: 101 mo
Battery Voltage: 2.99 V
Brady Statistic AP VP Percent: 0.46 %
Brady Statistic AP VS Percent: 98.33 %
Brady Statistic AS VP Percent: 0 %
Brady Statistic AS VS Percent: 1.21 %
Brady Statistic RA Percent Paced: 99.5 %
Brady Statistic RV Percent Paced: 0.46 %
Date Time Interrogation Session: 20250622194033
Implantable Lead Connection Status: 753985
Implantable Lead Connection Status: 753985
Implantable Lead Implant Date: 20110429
Implantable Lead Implant Date: 20201221
Implantable Lead Location: 753859
Implantable Lead Location: 753860
Implantable Lead Model: 5076
Implantable Pulse Generator Implant Date: 20201221
Lead Channel Impedance Value: 304 Ohm
Lead Channel Impedance Value: 380 Ohm
Lead Channel Impedance Value: 380 Ohm
Lead Channel Impedance Value: 608 Ohm
Lead Channel Pacing Threshold Amplitude: 0.75 V
Lead Channel Pacing Threshold Amplitude: 0.875 V
Lead Channel Pacing Threshold Pulse Width: 0.4 ms
Lead Channel Pacing Threshold Pulse Width: 0.4 ms
Lead Channel Sensing Intrinsic Amplitude: 1.875 mV
Lead Channel Sensing Intrinsic Amplitude: 1.875 mV
Lead Channel Sensing Intrinsic Amplitude: 6.5 mV
Lead Channel Sensing Intrinsic Amplitude: 6.5 mV
Lead Channel Setting Pacing Amplitude: 1.5 V
Lead Channel Setting Pacing Amplitude: 2.5 V
Lead Channel Setting Pacing Pulse Width: 0.4 ms
Lead Channel Setting Sensing Sensitivity: 1.2 mV
Zone Setting Status: 755011
Zone Setting Status: 755011

## 2024-04-24 DIAGNOSIS — H02051 Trichiasis without entropian right upper eyelid: Secondary | ICD-10-CM | POA: Diagnosis not present

## 2024-04-24 DIAGNOSIS — H02403 Unspecified ptosis of bilateral eyelids: Secondary | ICD-10-CM | POA: Diagnosis not present

## 2024-04-24 DIAGNOSIS — H02889 Meibomian gland dysfunction of unspecified eye, unspecified eyelid: Secondary | ICD-10-CM | POA: Diagnosis not present

## 2024-04-24 DIAGNOSIS — H43813 Vitreous degeneration, bilateral: Secondary | ICD-10-CM | POA: Diagnosis not present

## 2024-04-25 DIAGNOSIS — E782 Mixed hyperlipidemia: Secondary | ICD-10-CM | POA: Diagnosis not present

## 2024-04-25 DIAGNOSIS — R634 Abnormal weight loss: Secondary | ICD-10-CM | POA: Diagnosis not present

## 2024-04-25 DIAGNOSIS — R251 Tremor, unspecified: Secondary | ICD-10-CM | POA: Diagnosis not present

## 2024-04-25 DIAGNOSIS — Z789 Other specified health status: Secondary | ICD-10-CM | POA: Diagnosis not present

## 2024-04-25 DIAGNOSIS — C61 Malignant neoplasm of prostate: Secondary | ICD-10-CM | POA: Diagnosis not present

## 2024-04-25 DIAGNOSIS — Z79899 Other long term (current) drug therapy: Secondary | ICD-10-CM | POA: Diagnosis not present

## 2024-04-25 DIAGNOSIS — I10 Essential (primary) hypertension: Secondary | ICD-10-CM | POA: Diagnosis not present

## 2024-04-25 DIAGNOSIS — I4891 Unspecified atrial fibrillation: Secondary | ICD-10-CM | POA: Diagnosis not present

## 2024-06-04 DIAGNOSIS — H02834 Dermatochalasis of left upper eyelid: Secondary | ICD-10-CM | POA: Diagnosis not present

## 2024-06-04 DIAGNOSIS — H2513 Age-related nuclear cataract, bilateral: Secondary | ICD-10-CM | POA: Diagnosis not present

## 2024-06-04 DIAGNOSIS — H02831 Dermatochalasis of right upper eyelid: Secondary | ICD-10-CM | POA: Diagnosis not present

## 2024-06-04 DIAGNOSIS — H43813 Vitreous degeneration, bilateral: Secondary | ICD-10-CM | POA: Diagnosis not present

## 2024-06-05 DIAGNOSIS — R000 Tachycardia, unspecified: Secondary | ICD-10-CM | POA: Diagnosis not present

## 2024-06-05 DIAGNOSIS — I10 Essential (primary) hypertension: Secondary | ICD-10-CM | POA: Diagnosis not present

## 2024-06-05 DIAGNOSIS — R251 Tremor, unspecified: Secondary | ICD-10-CM | POA: Diagnosis not present

## 2024-06-05 DIAGNOSIS — I959 Hypotension, unspecified: Secondary | ICD-10-CM | POA: Diagnosis not present

## 2024-06-10 DIAGNOSIS — R972 Elevated prostate specific antigen [PSA]: Secondary | ICD-10-CM | POA: Diagnosis not present

## 2024-06-11 DIAGNOSIS — Z1331 Encounter for screening for depression: Secondary | ICD-10-CM | POA: Diagnosis not present

## 2024-06-11 DIAGNOSIS — I10 Essential (primary) hypertension: Secondary | ICD-10-CM | POA: Diagnosis not present

## 2024-06-11 DIAGNOSIS — I4891 Unspecified atrial fibrillation: Secondary | ICD-10-CM | POA: Diagnosis not present

## 2024-06-11 DIAGNOSIS — Z1339 Encounter for screening examination for other mental health and behavioral disorders: Secondary | ICD-10-CM | POA: Diagnosis not present

## 2024-06-11 DIAGNOSIS — R251 Tremor, unspecified: Secondary | ICD-10-CM | POA: Diagnosis not present

## 2024-06-11 DIAGNOSIS — R000 Tachycardia, unspecified: Secondary | ICD-10-CM | POA: Diagnosis not present

## 2024-06-11 DIAGNOSIS — Z0000 Encounter for general adult medical examination without abnormal findings: Secondary | ICD-10-CM | POA: Diagnosis not present

## 2024-06-11 DIAGNOSIS — Z23 Encounter for immunization: Secondary | ICD-10-CM | POA: Diagnosis not present

## 2024-06-17 DIAGNOSIS — N3941 Urge incontinence: Secondary | ICD-10-CM | POA: Diagnosis not present

## 2024-06-17 DIAGNOSIS — R3912 Poor urinary stream: Secondary | ICD-10-CM | POA: Diagnosis not present

## 2024-06-17 DIAGNOSIS — C61 Malignant neoplasm of prostate: Secondary | ICD-10-CM | POA: Diagnosis not present

## 2024-06-17 DIAGNOSIS — N403 Nodular prostate with lower urinary tract symptoms: Secondary | ICD-10-CM | POA: Diagnosis not present

## 2024-06-27 NOTE — Addendum Note (Signed)
 Addended by: VICCI SELLER A on: 06/27/2024 02:13 PM   Modules accepted: Orders

## 2024-06-27 NOTE — Progress Notes (Signed)
 Remote pacemaker transmission.

## 2024-07-04 ENCOUNTER — Other Ambulatory Visit (HOSPITAL_COMMUNITY): Payer: Self-pay

## 2024-07-16 ENCOUNTER — Ambulatory Visit (INDEPENDENT_AMBULATORY_CARE_PROVIDER_SITE_OTHER): Payer: Medicare Other

## 2024-07-16 DIAGNOSIS — I495 Sick sinus syndrome: Secondary | ICD-10-CM | POA: Diagnosis not present

## 2024-07-17 LAB — CUP PACEART REMOTE DEVICE CHECK
Battery Remaining Longevity: 99 mo
Battery Voltage: 2.99 V
Brady Statistic AP VP Percent: 3.51 %
Brady Statistic AP VS Percent: 96.04 %
Brady Statistic AS VP Percent: 0 %
Brady Statistic AS VS Percent: 0.45 %
Brady Statistic RA Percent Paced: 99.85 %
Brady Statistic RV Percent Paced: 3.51 %
Date Time Interrogation Session: 20250915193804
Implantable Lead Connection Status: 753985
Implantable Lead Connection Status: 753985
Implantable Lead Implant Date: 20110429
Implantable Lead Implant Date: 20201221
Implantable Lead Location: 753859
Implantable Lead Location: 753860
Implantable Lead Model: 5076
Implantable Pulse Generator Implant Date: 20201221
Lead Channel Impedance Value: 304 Ohm
Lead Channel Impedance Value: 399 Ohm
Lead Channel Impedance Value: 418 Ohm
Lead Channel Impedance Value: 646 Ohm
Lead Channel Pacing Threshold Amplitude: 0.625 V
Lead Channel Pacing Threshold Amplitude: 0.625 V
Lead Channel Pacing Threshold Pulse Width: 0.4 ms
Lead Channel Pacing Threshold Pulse Width: 0.4 ms
Lead Channel Sensing Intrinsic Amplitude: 2 mV
Lead Channel Sensing Intrinsic Amplitude: 2 mV
Lead Channel Sensing Intrinsic Amplitude: 7.75 mV
Lead Channel Sensing Intrinsic Amplitude: 7.75 mV
Lead Channel Setting Pacing Amplitude: 1.5 V
Lead Channel Setting Pacing Amplitude: 2.5 V
Lead Channel Setting Pacing Pulse Width: 0.4 ms
Lead Channel Setting Sensing Sensitivity: 1.2 mV
Zone Setting Status: 755011
Zone Setting Status: 755011

## 2024-07-18 DIAGNOSIS — I4891 Unspecified atrial fibrillation: Secondary | ICD-10-CM | POA: Diagnosis not present

## 2024-07-18 DIAGNOSIS — Z7185 Encounter for immunization safety counseling: Secondary | ICD-10-CM | POA: Diagnosis not present

## 2024-07-18 DIAGNOSIS — Z23 Encounter for immunization: Secondary | ICD-10-CM | POA: Diagnosis not present

## 2024-07-22 NOTE — Progress Notes (Signed)
 Remote PPM Transmission

## 2024-07-24 DIAGNOSIS — H02403 Unspecified ptosis of bilateral eyelids: Secondary | ICD-10-CM | POA: Diagnosis not present

## 2024-07-24 DIAGNOSIS — H02889 Meibomian gland dysfunction of unspecified eye, unspecified eyelid: Secondary | ICD-10-CM | POA: Diagnosis not present

## 2024-07-24 DIAGNOSIS — H02051 Trichiasis without entropian right upper eyelid: Secondary | ICD-10-CM | POA: Diagnosis not present

## 2024-07-24 DIAGNOSIS — H43813 Vitreous degeneration, bilateral: Secondary | ICD-10-CM | POA: Diagnosis not present

## 2024-07-27 ENCOUNTER — Ambulatory Visit: Payer: Self-pay | Admitting: Cardiovascular Disease

## 2024-09-05 DIAGNOSIS — H02403 Unspecified ptosis of bilateral eyelids: Secondary | ICD-10-CM | POA: Diagnosis not present

## 2024-09-05 DIAGNOSIS — H02889 Meibomian gland dysfunction of unspecified eye, unspecified eyelid: Secondary | ICD-10-CM | POA: Diagnosis not present

## 2024-09-05 DIAGNOSIS — H43813 Vitreous degeneration, bilateral: Secondary | ICD-10-CM | POA: Diagnosis not present

## 2024-09-05 DIAGNOSIS — H02051 Trichiasis without entropian right upper eyelid: Secondary | ICD-10-CM | POA: Diagnosis not present

## 2024-09-10 DIAGNOSIS — R972 Elevated prostate specific antigen [PSA]: Secondary | ICD-10-CM | POA: Diagnosis not present

## 2024-09-17 DIAGNOSIS — N3941 Urge incontinence: Secondary | ICD-10-CM | POA: Diagnosis not present

## 2024-09-17 DIAGNOSIS — R972 Elevated prostate specific antigen [PSA]: Secondary | ICD-10-CM | POA: Diagnosis not present

## 2024-09-17 DIAGNOSIS — C61 Malignant neoplasm of prostate: Secondary | ICD-10-CM | POA: Diagnosis not present

## 2024-09-17 DIAGNOSIS — R3912 Poor urinary stream: Secondary | ICD-10-CM | POA: Diagnosis not present

## 2024-09-17 DIAGNOSIS — R351 Nocturia: Secondary | ICD-10-CM | POA: Diagnosis not present

## 2024-09-17 DIAGNOSIS — N403 Nodular prostate with lower urinary tract symptoms: Secondary | ICD-10-CM | POA: Diagnosis not present

## 2024-09-18 ENCOUNTER — Other Ambulatory Visit (HOSPITAL_COMMUNITY): Payer: Self-pay | Admitting: Urology

## 2024-09-18 DIAGNOSIS — C61 Malignant neoplasm of prostate: Secondary | ICD-10-CM

## 2024-09-18 DIAGNOSIS — R972 Elevated prostate specific antigen [PSA]: Secondary | ICD-10-CM

## 2024-09-30 ENCOUNTER — Encounter (HOSPITAL_COMMUNITY)
Admission: RE | Admit: 2024-09-30 | Discharge: 2024-09-30 | Disposition: A | Source: Ambulatory Visit | Attending: Urology | Admitting: Urology

## 2024-09-30 DIAGNOSIS — R972 Elevated prostate specific antigen [PSA]: Secondary | ICD-10-CM

## 2024-09-30 DIAGNOSIS — C61 Malignant neoplasm of prostate: Secondary | ICD-10-CM | POA: Diagnosis present

## 2024-09-30 DIAGNOSIS — Z8546 Personal history of malignant neoplasm of prostate: Secondary | ICD-10-CM | POA: Diagnosis not present

## 2024-09-30 MED ORDER — TECHNETIUM TC 99M MEDRONATE IV KIT
20.0000 | PACK | Freq: Once | INTRAVENOUS | Status: AC | PRN
  Administered 2024-09-30: 19.2 via INTRAVENOUS

## 2024-10-15 ENCOUNTER — Ambulatory Visit: Payer: Medicare Other

## 2024-10-15 DIAGNOSIS — I495 Sick sinus syndrome: Secondary | ICD-10-CM | POA: Diagnosis not present

## 2024-10-16 LAB — CUP PACEART REMOTE DEVICE CHECK
Battery Remaining Longevity: 95 mo
Battery Voltage: 2.99 V
Brady Statistic AP VP Percent: 1.5 %
Brady Statistic AP VS Percent: 97.97 %
Brady Statistic AS VP Percent: 0 %
Brady Statistic AS VS Percent: 0.53 %
Brady Statistic RA Percent Paced: 99.63 %
Brady Statistic RV Percent Paced: 1.56 %
Date Time Interrogation Session: 20251216024651
Implantable Lead Connection Status: 753985
Implantable Lead Connection Status: 753985
Implantable Lead Implant Date: 20110429
Implantable Lead Implant Date: 20201221
Implantable Lead Location: 753859
Implantable Lead Location: 753860
Implantable Lead Model: 5076
Implantable Pulse Generator Implant Date: 20201221
Lead Channel Impedance Value: 323 Ohm
Lead Channel Impedance Value: 380 Ohm
Lead Channel Impedance Value: 418 Ohm
Lead Channel Impedance Value: 646 Ohm
Lead Channel Pacing Threshold Amplitude: 0.625 V
Lead Channel Pacing Threshold Amplitude: 0.75 V
Lead Channel Pacing Threshold Pulse Width: 0.4 ms
Lead Channel Pacing Threshold Pulse Width: 0.4 ms
Lead Channel Sensing Intrinsic Amplitude: 1.75 mV
Lead Channel Sensing Intrinsic Amplitude: 1.75 mV
Lead Channel Sensing Intrinsic Amplitude: 8.375 mV
Lead Channel Sensing Intrinsic Amplitude: 8.375 mV
Lead Channel Setting Pacing Amplitude: 1.5 V
Lead Channel Setting Pacing Amplitude: 2.5 V
Lead Channel Setting Pacing Pulse Width: 0.4 ms
Lead Channel Setting Sensing Sensitivity: 1.2 mV
Zone Setting Status: 755011
Zone Setting Status: 755011

## 2024-10-16 NOTE — Progress Notes (Signed)
 Remote PPM Transmission

## 2024-10-26 ENCOUNTER — Ambulatory Visit: Payer: Self-pay | Admitting: Cardiovascular Disease
